# Patient Record
Sex: Female | Born: 1955 | Race: White | Hispanic: No | Smoking: Current some day smoker
Health system: Southern US, Community
[De-identification: ages and names within clinical notes are randomized; demographics above are authoritative.]

## PROBLEM LIST (undated history)

## (undated) DIAGNOSIS — J441 Chronic obstructive pulmonary disease with (acute) exacerbation: Secondary | ICD-10-CM

## (undated) DIAGNOSIS — M797 Fibromyalgia: Secondary | ICD-10-CM

## (undated) DIAGNOSIS — J189 Pneumonia, unspecified organism: Secondary | ICD-10-CM

## (undated) DIAGNOSIS — E0590 Thyrotoxicosis, unspecified without thyrotoxic crisis or storm: Secondary | ICD-10-CM

## (undated) DIAGNOSIS — M1990 Unspecified osteoarthritis, unspecified site: Secondary | ICD-10-CM

## (undated) DIAGNOSIS — R51 Headache: Secondary | ICD-10-CM

## (undated) DIAGNOSIS — I10 Essential (primary) hypertension: Secondary | ICD-10-CM

## (undated) DIAGNOSIS — R0602 Shortness of breath: Secondary | ICD-10-CM

## (undated) DIAGNOSIS — I509 Heart failure, unspecified: Secondary | ICD-10-CM

## (undated) DIAGNOSIS — I219 Acute myocardial infarction, unspecified: Secondary | ICD-10-CM

## (undated) DIAGNOSIS — H3320 Serous retinal detachment, unspecified eye: Secondary | ICD-10-CM

## (undated) DIAGNOSIS — I160 Hypertensive urgency: Secondary | ICD-10-CM

## (undated) DIAGNOSIS — A4902 Methicillin resistant Staphylococcus aureus infection, unspecified site: Secondary | ICD-10-CM

## (undated) DIAGNOSIS — J40 Bronchitis, not specified as acute or chronic: Secondary | ICD-10-CM

## (undated) DIAGNOSIS — J9600 Acute respiratory failure, unspecified whether with hypoxia or hypercapnia: Secondary | ICD-10-CM

## (undated) DIAGNOSIS — M5430 Sciatica, unspecified side: Secondary | ICD-10-CM

## (undated) HISTORY — PX: TUBAL LIGATION: SHX77

## (undated) HISTORY — PX: KNEE SURGERY: SHX244

---

## 2004-07-16 ENCOUNTER — Emergency Department (HOSPITAL_COMMUNITY): Admission: EM | Admit: 2004-07-16 | Discharge: 2004-07-17 | Payer: Self-pay | Admitting: Emergency Medicine

## 2010-11-10 ENCOUNTER — Emergency Department (HOSPITAL_COMMUNITY): Payer: Self-pay

## 2010-11-10 ENCOUNTER — Emergency Department (HOSPITAL_COMMUNITY)
Admission: EM | Admit: 2010-11-10 | Discharge: 2010-11-10 | Disposition: A | Discharge status: Home / Self Care | Payer: Self-pay | Attending: Emergency Medicine | Admitting: Emergency Medicine

## 2010-11-10 DIAGNOSIS — IMO0001 Reserved for inherently not codable concepts without codable children: Secondary | ICD-10-CM | POA: Insufficient documentation

## 2010-11-10 DIAGNOSIS — M129 Arthropathy, unspecified: Secondary | ICD-10-CM | POA: Insufficient documentation

## 2010-11-10 DIAGNOSIS — J45909 Unspecified asthma, uncomplicated: Secondary | ICD-10-CM | POA: Insufficient documentation

## 2010-11-10 DIAGNOSIS — J329 Chronic sinusitis, unspecified: Secondary | ICD-10-CM | POA: Insufficient documentation

## 2010-11-10 DIAGNOSIS — R51 Headache: Secondary | ICD-10-CM | POA: Insufficient documentation

## 2011-03-28 ENCOUNTER — Emergency Department (HOSPITAL_COMMUNITY): Payer: Self-pay

## 2011-03-28 ENCOUNTER — Encounter: Payer: Self-pay | Admitting: Emergency Medicine

## 2011-03-28 ENCOUNTER — Emergency Department (HOSPITAL_COMMUNITY)
Admission: EM | Admit: 2011-03-28 | Discharge: 2011-03-28 | Disposition: A | Discharge status: Home / Self Care | Payer: Self-pay | Attending: Emergency Medicine | Admitting: Emergency Medicine

## 2011-03-28 DIAGNOSIS — R062 Wheezing: Secondary | ICD-10-CM | POA: Insufficient documentation

## 2011-03-28 DIAGNOSIS — R05 Cough: Secondary | ICD-10-CM | POA: Insufficient documentation

## 2011-03-28 DIAGNOSIS — F17200 Nicotine dependence, unspecified, uncomplicated: Secondary | ICD-10-CM | POA: Insufficient documentation

## 2011-03-28 DIAGNOSIS — R059 Cough, unspecified: Secondary | ICD-10-CM | POA: Insufficient documentation

## 2011-03-28 DIAGNOSIS — J40 Bronchitis, not specified as acute or chronic: Secondary | ICD-10-CM | POA: Insufficient documentation

## 2011-03-28 DIAGNOSIS — H9209 Otalgia, unspecified ear: Secondary | ICD-10-CM | POA: Insufficient documentation

## 2011-03-28 DIAGNOSIS — J9801 Acute bronchospasm: Secondary | ICD-10-CM | POA: Insufficient documentation

## 2011-03-28 MED ORDER — HYDROCODONE-ACETAMINOPHEN 5-325 MG PO TABS: 1.0000 | ORAL_TABLET | Freq: Four times a day (QID) | ORAL | Status: AC | PRN

## 2011-03-28 MED ORDER — DOXYCYCLINE HYCLATE 100 MG PO CAPS: 100.0000 mg | ORAL_CAPSULE | Freq: Two times a day (BID) | ORAL | Status: AC

## 2011-03-28 MED ORDER — METHYLPREDNISOLONE SODIUM SUCC 125 MG IJ SOLR
125.0000 mg | Freq: Once | INTRAMUSCULAR | Status: AC
  Administered 2011-03-28: 125 mg via INTRAMUSCULAR
  Filled 2011-03-28: qty 2

## 2011-03-28 MED ORDER — OXYCODONE-ACETAMINOPHEN 5-325 MG PO TABS
1.0000 | ORAL_TABLET | Freq: Once | ORAL | Status: AC
  Administered 2011-03-28: 1 via ORAL
  Filled 2011-03-28: qty 1

## 2011-03-28 MED ORDER — ALBUTEROL SULFATE HFA 108 (90 BASE) MCG/ACT IN AERS
2.0000 | INHALATION_SPRAY | RESPIRATORY_TRACT | Status: DC
  Administered 2011-03-28: 2 via RESPIRATORY_TRACT
  Filled 2011-03-28: qty 6.7

## 2011-03-28 NOTE — ED Provider Notes (Signed)
Scribed for Benny Lennert, MD, the patient was seen in room APA07/APA07 . This chart was scribed by Ellie Lunch. This patient's care was started at 7:41 PM.   CSN: [id number] Arrival date & time: 03/28/2011  7:13 PM  Chief Complaint  Patient presents with  . Cough  . Otalgia    (Consider location/radiation/quality/duration/timing/severity/associated sxs/prior treatment) HPI Marie West is a 55 y.o. female who presents to the Emergency Department complaining of productive cough with green sputum for the past two weeks.  Cough has been constant and become progressively worse. Also c/o right ear pain. Pt denies fever or ST. Reports this is the 1st time seeing a doctor for her symptoms.   History reviewed. No pertinent past medical history.  Past Surgical History  Procedure Date  . Knee surgery   . Tubal ligation     No family history on file.  History  Substance Use Topics  . Smoking status: Current Everyday Smoker -- 1.0 packs/day    Types: Cigarettes  . Smokeless tobacco: Not on file  . Alcohol Use: Yes     occ-wine    Review of Systems  Constitutional: Negative for fever and fatigue.  HENT: Positive for ear pain. Negative for congestion, sore throat, sinus pressure and ear discharge.   Eyes: Negative for discharge.  Respiratory: Positive for cough.   Cardiovascular: Negative for chest pain.  Gastrointestinal: Negative for abdominal pain and diarrhea.  Genitourinary: Negative for frequency and hematuria.  Musculoskeletal: Negative for back pain.  Skin: Negative for rash.  Neurological: Negative for seizures and headaches.  Hematological: Negative.   All other systems reviewed and are negative.   Allergies  Review of patient's allergies indicates no known allergies.  Home Medications  No current outpatient prescriptions on file.  BP 155/121  Pulse 79  Temp(Src) 98.6 F (37 C) (Oral)  Resp 20  Ht 5\' 4"  (1.626 m)  Wt 230 lb (104.327 kg)  BMI 39.48  kg/m2  SpO2 96%  Physical Exam  Nursing note and vitals reviewed. Constitutional: She is oriented to person, place, and time. She appears well-developed.  HENT:  Head: Normocephalic and atraumatic.       Right TM inflamed  Eyes: Conjunctivae and EOM are normal. No scleral icterus.  Neck: Neck supple. No thyromegaly present.  Cardiovascular: Normal rate and regular rhythm.  Exam reveals no gallop and no friction rub.   No murmur heard. Pulmonary/Chest: No stridor. She has wheezes (mild bilateral wheezes). She has no rales. She exhibits tenderness (left lateral ribs tender).       slight wheezing  Abdominal: She exhibits no distension. There is no tenderness. There is no rebound.  Musculoskeletal: Normal range of motion. She exhibits no edema.  Lymphadenopathy:    She has no cervical adenopathy.  Neurological: She is oriented to person, place, and time. Coordination normal.  Skin: No rash noted. No erythema.  Psychiatric: She has a normal mood and affect. Her behavior is normal.   Procedures (including critical care time)  OTHER DATA REVIEWED: Nursing notes, vital signs, and past medical records reviewed.   DIAGNOSTIC STUDIES: Oxygen Saturation is 96% on room air, normal by my interpretation.    LABS / RADIOLOGY:  Dg Chest 2 View  03/28/2011  *RADIOLOGY REPORT*  Clinical Data: Cough and congestion.  CHEST - 2 VIEW  Comparison: 11/10/2010.  Findings: Cardiomegaly.  Tortuous calcified aorta.  Central pulmonary vascular prominence.  No segmental infiltrate or pneumothorax.  Degenerative changes thoracic spine.  IMPRESSION:  Cardiomegaly and central pulmonary vessel prominence.  Calcified tortuous aorta.  No segmental infiltrate.  Original Report Authenticated By: Fuller Canada, M.D.   MDM: bronchitis  MEDICATIONS GIVEN IN THE E.D.  Medications  albuterol (PROVENTIL HFA;VENTOLIN HFA) 108 (90 BASE) MCG/ACT inhaler 2 puff (not administered)  methylPREDNISolone sodium succinate  (SOLU-MEDROL) 125 MG injection 125 mg (not administered)  doxycycline (VIBRAMYCIN) 100 MG capsule (not administered)  HYDROcodone-acetaminophen (NORCO) 5-325 MG per tablet (not administered)    DISCHARGE MEDICATIONS: New Prescriptions   DOXYCYCLINE (VIBRAMYCIN) 100 MG CAPSULE    Take 1 capsule (100 mg total) by mouth 2 (two) times daily.   HYDROCODONE-ACETAMINOPHEN (NORCO) 5-325 MG PER TABLET    Take 1 tablet by mouth every 6 (six) hours as needed for pain.    SCRIBE ATTESTATION: The chart was scribed for me under my direct supervision.  I personally performed the history, physical, and medical decision making and all procedures in the evaluation of this patient.Benny Lennert, MD 03/28/11 2126

## 2011-03-28 NOTE — ED Notes (Signed)
Patient c/o right ear pain and productive cough; states green.

## 2011-06-06 ENCOUNTER — Encounter (HOSPITAL_COMMUNITY): Payer: Self-pay | Admitting: *Deleted

## 2011-06-06 ENCOUNTER — Emergency Department (HOSPITAL_COMMUNITY)
Admission: EM | Admit: 2011-06-06 | Discharge: 2011-06-07 | Disposition: A | Discharge status: Home / Self Care | Payer: Self-pay | Attending: Emergency Medicine | Admitting: Emergency Medicine

## 2011-06-06 DIAGNOSIS — R220 Localized swelling, mass and lump, head: Secondary | ICD-10-CM | POA: Insufficient documentation

## 2011-06-06 DIAGNOSIS — F17200 Nicotine dependence, unspecified, uncomplicated: Secondary | ICD-10-CM | POA: Insufficient documentation

## 2011-06-06 DIAGNOSIS — K047 Periapical abscess without sinus: Secondary | ICD-10-CM | POA: Insufficient documentation

## 2011-06-06 DIAGNOSIS — R221 Localized swelling, mass and lump, neck: Secondary | ICD-10-CM | POA: Insufficient documentation

## 2011-06-06 DIAGNOSIS — K089 Disorder of teeth and supporting structures, unspecified: Secondary | ICD-10-CM | POA: Insufficient documentation

## 2011-06-06 NOTE — ED Notes (Signed)
Reports toothache that began last night.  Reports taking ibuprofen and tylenol through day for pain control.

## 2011-06-07 MED ORDER — OXYCODONE-ACETAMINOPHEN 5-325 MG PO TABS
1.0000 | ORAL_TABLET | Freq: Once | ORAL | Status: AC
  Administered 2011-06-07: 1 via ORAL
  Filled 2011-06-07: qty 1

## 2011-06-07 MED ORDER — AMOXICILLIN 500 MG PO CAPS: 500.0000 mg | ORAL_CAPSULE | Freq: Three times a day (TID) | ORAL | Status: AC

## 2011-06-07 MED ORDER — OXYCODONE-ACETAMINOPHEN 5-325 MG PO TABS: 1.0000 | ORAL_TABLET | ORAL | Status: AC | PRN

## 2011-06-07 MED ORDER — AMOXICILLIN 250 MG PO CAPS
500.0000 mg | ORAL_CAPSULE | Freq: Once | ORAL | Status: AC
  Administered 2011-06-07: 500 mg via ORAL
  Filled 2011-06-07: qty 2

## 2011-06-07 MED ORDER — IBUPROFEN 600 MG PO TABS: 600.0000 mg | ORAL_TABLET | Freq: Four times a day (QID) | ORAL | Status: AC | PRN

## 2011-06-07 NOTE — ED Provider Notes (Signed)
Medical screening examination/treatment/procedure(s) were performed by non-physician practitioner and as supervising physician I was immediately available for consultation/collaboration. Devoria Albe, MD, Armando Gang   [name], MD 06/07/11 [phone]

## 2011-06-07 NOTE — ED Provider Notes (Signed)
History     CSN: [id number] Arrival date & time: 06/06/2011 11:31 PM   First MD Initiated Contact with Patient 06/06/11 2336      Chief Complaint  Patient presents with  . Dental Pain    (Consider location/radiation/quality/duration/timing/severity/associated sxs/prior treatment) Patient is a 55 y.o. female presenting with tooth pain. The history is provided by the patient.  Dental PainThe primary symptoms include mouth pain. Primary symptoms do not include headaches, fever, shortness of breath or sore throat. The symptoms began yesterday. The symptoms are worsening. The symptoms occur constantly.  Additional symptoms include: dental sensitivity to temperature, gum swelling, gum tenderness and facial swelling. Additional symptoms do not include: jaw pain, pain with swallowing, ear pain, nosebleeds and swollen glands.    History reviewed. No pertinent past medical history.  Past Surgical History  Procedure Date  . Knee surgery   . Tubal ligation     History reviewed. No pertinent family history.  History  Substance Use Topics  . Smoking status: Current Everyday Smoker -- 1.0 packs/day    Types: Cigarettes  . Smokeless tobacco: Not on file  . Alcohol Use: Yes     occ-wine    OB History    Grav Para Term Preterm Abortions TAB SAB Ect Mult Living                  Review of Systems  Constitutional: Negative for fever.  HENT: Positive for facial swelling and dental problem. Negative for ear pain, nosebleeds, congestion, sore throat and neck pain.   Eyes: Negative.   Respiratory: Negative for chest tightness and shortness of breath.   Cardiovascular: Negative for chest pain.  Gastrointestinal: Negative for nausea and abdominal pain.  Genitourinary: Negative.   Musculoskeletal: Negative for joint swelling and arthralgias.  Skin: Negative.  Negative for rash and wound.  Neurological: Negative for dizziness, weakness, light-headedness, numbness and headaches.    Hematological: Negative.   Psychiatric/Behavioral: Negative.     Allergies  Bee venom  Home Medications   Current Outpatient Rx  Name Route Sig Dispense Refill  . AMOXICILLIN 500 MG PO CAPS Oral Take 1 capsule (500 mg total) by mouth 3 (three) times daily. 30 capsule 0  . COUGH & COLD PO Oral Take 5 mLs by mouth as needed. For cough       . DIPHENHYDRAMINE-ZINC ACETATE 1-0.1 % EX CREA Topical Apply 1 application topically daily as needed. For rash on hands     . HYDROCORTISONE 1 % EX CREA Topical Apply 1 application topically daily as needed. For rash on hands     . IBUPROFEN 200 MG PO TABS Oral Take 800 mg by mouth as needed. For pain    . IBUPROFEN 600 MG PO TABS Oral Take 1 tablet (600 mg total) by mouth every 6 (six) hours as needed for pain. 30 tablet 0  . OXYCODONE-ACETAMINOPHEN 5-325 MG PO TABS Oral Take 1 tablet by mouth every 4 (four) hours as needed for pain. 15 tablet 0    Pulse 81  Temp(Src) 98.6 F (37 C) (Oral)  Resp 18  Ht 5\' 10"  (1.778 m)  Wt 250 lb (113.399 kg)  BMI 35.87 kg/m2  SpO2 96%  Physical Exam  Nursing note and vitals reviewed. Constitutional: She is oriented to person, place, and time. She appears well-developed and well-nourished. No distress.  HENT:  Head: Normocephalic and atraumatic.  Right Ear: Tympanic membrane and external ear normal.  Left Ear: Tympanic membrane and external ear normal.  Mouth/Throat: Oropharynx is clear and moist and mucous membranes are normal. No oral lesions. Dental abscesses present.    Eyes: Conjunctivae are normal.  Neck: Normal range of motion. Neck supple.  Cardiovascular: Normal rate.   Pulmonary/Chest: Effort normal. No respiratory distress.  Musculoskeletal: Normal range of motion.  Lymphadenopathy:    She has no cervical adenopathy.  Neurological: She is alert and oriented to person, place, and time.  Skin: Skin is warm and dry. No erythema.  Psychiatric: She has a normal mood and affect.    ED  Course  Procedures (including critical care time)  Labs Reviewed - No data to display No results found.   1. Dental abscess       MDM  Amoxil.  Oxycodone.  Dental referrals given.        Candis Musa, PA 06/07/11 [phone]

## 2011-06-07 NOTE — ED Notes (Signed)
Swelling right side of face.  

## 2011-10-21 ENCOUNTER — Encounter (HOSPITAL_COMMUNITY): Payer: Self-pay | Admitting: *Deleted

## 2011-10-21 ENCOUNTER — Emergency Department (HOSPITAL_COMMUNITY)
Admission: EM | Admit: 2011-10-21 | Discharge: 2011-10-21 | Disposition: A | Discharge status: Home / Self Care | Payer: Self-pay | Attending: Emergency Medicine | Admitting: Emergency Medicine

## 2011-10-21 DIAGNOSIS — Z79899 Other long term (current) drug therapy: Secondary | ICD-10-CM | POA: Insufficient documentation

## 2011-10-21 DIAGNOSIS — F17200 Nicotine dependence, unspecified, uncomplicated: Secondary | ICD-10-CM | POA: Insufficient documentation

## 2011-10-21 DIAGNOSIS — X500XXA Overexertion from strenuous movement or load, initial encounter: Secondary | ICD-10-CM | POA: Insufficient documentation

## 2011-10-21 DIAGNOSIS — S39012A Strain of muscle, fascia and tendon of lower back, initial encounter: Secondary | ICD-10-CM

## 2011-10-21 DIAGNOSIS — Z6841 Body Mass Index (BMI) 40.0 and over, adult: Secondary | ICD-10-CM | POA: Insufficient documentation

## 2011-10-21 DIAGNOSIS — M549 Dorsalgia, unspecified: Secondary | ICD-10-CM | POA: Insufficient documentation

## 2011-10-21 DIAGNOSIS — E669 Obesity, unspecified: Secondary | ICD-10-CM | POA: Insufficient documentation

## 2011-10-21 DIAGNOSIS — S335XXA Sprain of ligaments of lumbar spine, initial encounter: Secondary | ICD-10-CM | POA: Insufficient documentation

## 2011-10-21 DIAGNOSIS — Y998 Other external cause status: Secondary | ICD-10-CM | POA: Insufficient documentation

## 2011-10-21 HISTORY — DX: Methicillin resistant Staphylococcus aureus infection, unspecified site: A49.02

## 2011-10-21 MED ORDER — HYDROCODONE-ACETAMINOPHEN 7.5-325 MG PO TABS: 1.0000 | ORAL_TABLET | ORAL | Status: AC | PRN

## 2011-10-21 MED ORDER — MELOXICAM 7.5 MG PO TABS: ORAL_TABLET | ORAL | Status: DC

## 2011-10-21 MED ORDER — BACLOFEN 10 MG PO TABS: 10.0000 mg | ORAL_TABLET | Freq: Three times a day (TID) | ORAL | Status: AC

## 2011-10-21 NOTE — ED Notes (Addendum)
Pt states her large dog "knocked her off balance, hearing a pop noise". Awoke this morning with mid back ache that has progressively gotten worse over the day. Pain has radiated into rt buttock and leg. Denies bladder and bowel incontinent.   Bilateral lower extremities pulses equal.

## 2011-10-21 NOTE — ED Notes (Signed)
Dog hit back of leg and pt went backwards..  Caught herself and did not fall.  Pain rt back and down leg.

## 2011-10-21 NOTE — ED Notes (Signed)
EDP at bedside.  Discussing plan of care and examining pt.

## 2011-10-21 NOTE — ED Provider Notes (Signed)
History     CSN: [id number]  Arrival date & time 10/21/11  2001   First MD Initiated Contact with Patient 10/21/11 2023      Chief Complaint  Patient presents with  . Back Pain    (Consider location/radiation/quality/duration/timing/severity/associated sxs/prior treatment) Patient is a 56 y.o. female presenting with back pain. The history is provided by the patient.  Back Pain  This is a new problem. The current episode started yesterday. The problem occurs constantly. The problem has been gradually worsening. The pain is associated with twisting (Her heavy dog jumped on her, and caused twisting.). The pain is present in the thoracic spine and lumbar spine. The quality of the pain is described as aching and cramping. The pain radiates to the right thigh. The pain is moderate. The symptoms are aggravated by certain positions (laughing). The pain is the same all the time. Stiffness is present all day. Pertinent negatives include no chest pain, no numbness, no abdominal pain, no bowel incontinence, no perianal numbness, no bladder incontinence, no dysuria and no paresthesias. She has tried NSAIDs for the symptoms. The treatment provided no relief. Risk factors include obesity (previous knee surgery).    Past Medical History  Diagnosis Date  . MRSA infection     Past Surgical History  Procedure Date  . Knee surgery   . Tubal ligation     History reviewed. No pertinent family history.  History  Substance Use Topics  . Smoking status: Current Everyday Smoker -- 1.0 packs/day    Types: Cigarettes  . Smokeless tobacco: Not on file  . Alcohol Use: Yes     occ-wine    OB History    Grav Para Term Preterm Abortions TAB SAB Ect Mult Living                  Review of Systems  Constitutional: Negative for activity change.       All ROS Neg except as noted in HPI  HENT: Negative for nosebleeds and neck pain.   Eyes: Negative for photophobia and discharge.  Respiratory: Negative  for cough, shortness of breath and wheezing.   Cardiovascular: Negative for chest pain and palpitations.  Gastrointestinal: Negative for abdominal pain, blood in stool and bowel incontinence.  Genitourinary: Negative for bladder incontinence, dysuria, frequency and hematuria.  Musculoskeletal: Positive for back pain. Negative for arthralgias.  Skin: Negative.   Neurological: Negative for dizziness, seizures, speech difficulty, numbness and paresthesias.  Psychiatric/Behavioral: Negative for hallucinations and confusion.    Allergies  Bee venom  Home Medications   Current Outpatient Rx  Name Route Sig Dispense Refill  . BACLOFEN 10 MG PO TABS Oral Take 1 tablet (10 mg total) by mouth 3 (three) times daily. 21 each 0  . COUGH & COLD PO Oral Take 5 mLs by mouth as needed. For cough       . DIPHENHYDRAMINE-ZINC ACETATE 1-0.1 % EX CREA Topical Apply 1 application topically daily as needed. For rash on hands     . HYDROCODONE-ACETAMINOPHEN 7.5-325 MG PO TABS Oral Take 1 tablet by mouth every 4 (four) hours as needed for pain. 20 tablet 0  . HYDROCORTISONE 1 % EX CREA Topical Apply 1 application topically daily as needed. For rash on hands     . IBUPROFEN 200 MG PO TABS Oral Take 800 mg by mouth as needed. For pain    . MELOXICAM 7.5 MG PO TABS  1 po bid with food 12 tablet 0  BP 159/96  Pulse 79  Temp 98.3 F (36.8 C)  Resp 20  Ht 5\' 3"  (1.6 m)  Wt 250 lb (113.399 kg)  BMI 44.29 kg/m2  SpO2 96%  Physical Exam  Nursing note and vitals reviewed. Constitutional: She is oriented to person, place, and time. She appears well-developed and well-nourished.  Non-toxic appearance.  HENT:  Head: Normocephalic.  Right Ear: Tympanic membrane and external ear normal.  Left Ear: Tympanic membrane and external ear normal.  Eyes: EOM and lids are normal. Pupils are equal, round, and reactive to light.  Neck: Normal range of motion. Neck supple. Carotid bruit is not present.  Cardiovascular:  Normal rate, regular rhythm, normal heart sounds, intact distal pulses and normal pulses.   Pulmonary/Chest: Breath sounds normal. No respiratory distress.  Abdominal: Soft. Bowel sounds are normal. There is no tenderness. There is no guarding.  Musculoskeletal: Normal range of motion.       Pain to palpation and attempted ROM of the lumbar area. No palpable deformity.  Lymphadenopathy:       Head (right side): No submandibular adenopathy present.       Head (left side): No submandibular adenopathy present.    She has no cervical adenopathy.  Neurological: She is alert and oriented to person, place, and time. She has normal strength. No cranial nerve deficit or sensory deficit. She exhibits normal muscle tone. Coordination normal.       Gait slow, but intact.  Skin: Skin is warm and dry.  Psychiatric: She has a normal mood and affect. Her speech is normal.    ED Course  Procedures (including critical care time)  Labs Reviewed - No data to display No results found.   1. Lumbar strain       MDM  I have reviewed nursing notes, vital signs, and all appropriate lab and imaging results for this patient. No gross neuro deficits. Suspect lumbar strain. Pt to see PCP for recheck if not improving. Rx for baclofen, norco, Mobic given. Pt to use heating pad to back.       Kathie Dike, [place] 10/21/11 2048

## 2011-10-21 NOTE — Discharge Instructions (Signed)
Please rest your back as much as possible. Please use a heating pad to the lower back. Baclofen and mobic daily . Norco as needed for pain. This medication may cause drowsiness, use with caution. If not improving, please see your primary MD or Dr Shon Baton (orthopedics) for additional evaluation of your back.Muscle Strain A muscle strain (pulled muscle) happens when a muscle is over-stretched. Recovery usually takes 5 to 6 weeks.  HOME CARE   Put ice on the injured area.   Put ice in a plastic bag.   Place a towel between your skin and the bag.   Leave the ice on for 15 to 20 minutes at a time, every hour for the first 2 days.   Do not use the muscle for several days or until your doctor says you can. Do not use the muscle if you have pain.   Wrap the injured area with an elastic bandage for comfort. Do not put it on too tightly.   Only take medicine as told by your doctor.   Warm up before exercise. This helps prevent muscle strains.  GET HELP RIGHT AWAY IF:  There is increased pain or puffiness (swelling) in the affected area. MAKE SURE YOU:   Understand these instructions.   Will watch your condition.   Will get help right away if you are not doing well or get worse.  Document Released: 03/15/2008 Document Revised: 05/26/2011 Document Reviewed: 03/15/2008 [facility] Patient Information 2012 [place], [place].

## 2011-10-21 NOTE — ED Provider Notes (Signed)
Medical screening examination/treatment/procedure(s) were performed by non-physician practitioner and as supervising physician I was immediately available for consultation/collaboration.   Tenee Wish, MD 10/21/11 2340 

## 2011-12-29 ENCOUNTER — Encounter (HOSPITAL_COMMUNITY): Payer: Self-pay | Admitting: *Deleted

## 2011-12-29 ENCOUNTER — Emergency Department (HOSPITAL_COMMUNITY)
Admission: EM | Admit: 2011-12-29 | Discharge: 2011-12-29 | Disposition: A | Discharge status: Home / Self Care | Payer: Self-pay | Attending: Emergency Medicine | Admitting: Emergency Medicine

## 2011-12-29 DIAGNOSIS — M25473 Effusion, unspecified ankle: Secondary | ICD-10-CM | POA: Insufficient documentation

## 2011-12-29 DIAGNOSIS — M25476 Effusion, unspecified foot: Secondary | ICD-10-CM | POA: Insufficient documentation

## 2011-12-29 DIAGNOSIS — R609 Edema, unspecified: Secondary | ICD-10-CM | POA: Insufficient documentation

## 2011-12-29 DIAGNOSIS — M79609 Pain in unspecified limb: Secondary | ICD-10-CM | POA: Insufficient documentation

## 2011-12-29 DIAGNOSIS — R5381 Other malaise: Secondary | ICD-10-CM | POA: Insufficient documentation

## 2011-12-29 DIAGNOSIS — R5383 Other fatigue: Secondary | ICD-10-CM | POA: Insufficient documentation

## 2011-12-29 DIAGNOSIS — R030 Elevated blood-pressure reading, without diagnosis of hypertension: Secondary | ICD-10-CM | POA: Insufficient documentation

## 2011-12-29 DIAGNOSIS — M7989 Other specified soft tissue disorders: Secondary | ICD-10-CM | POA: Insufficient documentation

## 2011-12-29 DIAGNOSIS — I10 Essential (primary) hypertension: Secondary | ICD-10-CM

## 2011-12-29 LAB — CBC WITH DIFFERENTIAL/PLATELET
Basophils Absolute: 0 10*3/uL (ref 0.0–0.1)
Basophils Relative: 0 % (ref 0–1)
Eosinophils Absolute: 0.4 10*3/uL (ref 0.0–0.7)
Eosinophils Relative: 6 % — ABNORMAL HIGH (ref 0–5)
HCT: 40 % (ref 36.0–46.0)
Hemoglobin: 12.9 g/dL (ref 12.0–15.0)
Lymphocytes Relative: 34 % (ref 12–46)
Lymphs Abs: 2.5 10*3/uL (ref 0.7–4.0)
MCH: 29.4 pg (ref 26.0–34.0)
MCHC: 32.3 g/dL (ref 30.0–36.0)
MCV: 91.1 fL (ref 78.0–100.0)
Monocytes Absolute: 0.6 10*3/uL (ref 0.1–1.0)
Monocytes Relative: 8 % (ref 3–12)
Neutro Abs: 3.9 10*3/uL (ref 1.7–7.7)
Neutrophils Relative %: 52 % (ref 43–77)
Platelets: 181 10*3/uL (ref 150–400)
RBC: 4.39 MIL/uL (ref 3.87–5.11)
RDW: 14.2 % (ref 11.5–15.5)
WBC: 7.5 10*3/uL (ref 4.0–10.5)

## 2011-12-29 LAB — COMPREHENSIVE METABOLIC PANEL
ALT: 17 U/L (ref 0–35)
AST: 17 U/L (ref 0–37)
Albumin: 3.9 g/dL (ref 3.5–5.2)
Alkaline Phosphatase: 65 U/L (ref 39–117)
BUN: 17 mg/dL (ref 6–23)
CO2: 31 mEq/L (ref 19–32)
Calcium: 9.3 mg/dL (ref 8.4–10.5)
Chloride: 101 mEq/L (ref 96–112)
Creatinine, Ser: 0.68 mg/dL (ref 0.50–1.10)
GFR calc Af Amer: >90 mL/min (ref >90)
GFR calc non Af Amer: >90 mL/min (ref >90)
Glucose, Bld: 93 mg/dL (ref 70–99)
Potassium: 3.8 mEq/L (ref 3.5–5.1)
Sodium: 140 mEq/L (ref 135–145)
Total Bilirubin: 0.3 mg/dL (ref 0.3–1.2)
Total Protein: 7.2 g/dL (ref 6.0–8.3)

## 2011-12-29 MED ORDER — TRAMADOL HCL 50 MG PO TABS: 50.0000 mg | ORAL_TABLET | Freq: Four times a day (QID) | ORAL | Status: AC | PRN

## 2011-12-29 MED ORDER — HYDROCHLOROTHIAZIDE 25 MG PO TABS: 25.0000 mg | ORAL_TABLET | Freq: Every day | ORAL | Status: DC

## 2011-12-29 NOTE — ED Provider Notes (Cosign Needed)
History    This chart was scribed for Benny Lennert, MD, MD by Smitty Pluck. The patient was seen in room APA17 and the patient's care was started at 7:12PM.   CSN: [id number]  Arrival date & time 12/29/11  1842   First MD Initiated Contact with Patient 12/29/11 1907      Chief Complaint  Patient presents with  . Joint Swelling    (Consider location/radiation/quality/duration/timing/severity/associated sxs/prior treatment) Patient is a 56 y.o. female presenting with leg pain. The history is provided by the patient. No language interpreter was used.  Leg Pain  The incident occurred more than 2 days ago. The incident occurred at home. There was no injury mechanism. Pain location: right and left calfs. The quality of the pain is described as aching. The pain is at a severity of 4/10.   KRYSIA ZAHRADNIK is a 56 y.o. female who presents to the Emergency Department complaining of moderate bilateral ankle and lower leg swelling onset over  1 week ago. Pt reports that swelling started in right leg. Reports having generalized weakness. Denies chest pain, cough, n/v/d and SOB. Symptoms have been constant.   Past Medical History  Diagnosis Date  . MRSA infection     Past Surgical History  Procedure Date  . Knee surgery   . Tubal ligation     History reviewed. No pertinent family history.  History  Substance Use Topics  . Smoking status: Current Everyday Smoker -- 1.0 packs/day    Types: Cigarettes  . Smokeless tobacco: Not on file  . Alcohol Use: Yes     occ-wine    OB History    Grav Para Term Preterm Abortions TAB SAB Ect Mult Living                  Review of Systems  All other systems reviewed and are negative.  10 Systems reviewed and all are negative for acute change except as noted in the HPI.    Allergies  Bee venom  Home Medications   Current Outpatient Rx  Name Route Sig Dispense Refill  . COUGH & COLD PO Oral Take 5 mLs by mouth as needed. For cough       . DIPHENHYDRAMINE-ZINC ACETATE 1-0.1 % EX CREA Topical Apply 1 application topically daily as needed. For rash on hands     . HYDROCORTISONE 1 % EX CREA Topical Apply 1 application topically daily as needed. For rash on hands     . IBUPROFEN 200 MG PO TABS Oral Take 800 mg by mouth as needed. For pain    . MELOXICAM 7.5 MG PO TABS  1 po bid with food 12 tablet 0    BP 135/111  Pulse 80  Temp 98.5 F (36.9 C) (Oral)  Resp 18  Ht 5\' 2"  (1.575 m)  Wt 250 lb (113.399 kg)  BMI 45.73 kg/m2  SpO2 96%  Physical Exam  Nursing note and vitals reviewed. Constitutional: She is oriented to person, place, and time. She appears well-developed.  HENT:  Head: Normocephalic and atraumatic.  Eyes: Conjunctivae and EOM are normal. No scleral icterus.  Neck: Neck supple. No thyromegaly present.  Cardiovascular: Normal rate and regular rhythm.  Exam reveals no gallop and no friction rub.   No murmur heard. Pulmonary/Chest: No stridor. She has no wheezes. She has no rales. She exhibits no tenderness.  Abdominal: She exhibits no distension. There is no tenderness. There is no rebound.  Musculoskeletal: Normal range of motion. She  exhibits edema (2+).  Lymphadenopathy:    She has no cervical adenopathy.  Neurological: She is oriented to person, place, and time. Coordination normal.  Skin: No rash noted. No erythema.  Psychiatric: She has a normal mood and affect. Her behavior is normal.    ED Course  Procedures (including critical care time) DIAGNOSTIC STUDIES: Oxygen Saturation is 96% on room air, normal by my interpretation.    COORDINATION OF CARE: 7:16PM EDP discusses pt ED treatment with pt.  9:36PM EDP recheck pt and discusses lab results.   Labs Reviewed  CBC WITH DIFFERENTIAL - Abnormal; Notable for the following:    Eosinophils Relative 6 (*)     All other components within normal limits  COMPREHENSIVE METABOLIC PANEL   No results found.   No diagnosis found.    MDM      The chart was scribed for me under my direct supervision.  I personally performed the history, physical, and medical decision making and all procedures in the evaluation of this patient.Benny Lennert, MD 12/29/11 2142

## 2011-12-29 NOTE — ED Notes (Signed)
Pt alert & oriented x4, stable gait. Patient given discharge instructions, paperwork & prescription(s). Patient  instructed to stop at the registration desk to finish any additional paperwork. Patient verbalized understanding. Pt left department w/ no further questions. 

## 2011-12-29 NOTE — ED Notes (Signed)
Bil ankle and lower leg swelling for over a week, Hurting worse today.

## 2012-05-31 ENCOUNTER — Encounter (HOSPITAL_COMMUNITY): Payer: Self-pay | Admitting: Emergency Medicine

## 2012-05-31 ENCOUNTER — Inpatient Hospital Stay (HOSPITAL_COMMUNITY)
Admission: EM | Admit: 2012-05-31 | Discharge: 2012-06-02 | DRG: 191 | Disposition: A | Discharge status: Home / Self Care | Payer: Self-pay | Attending: Internal Medicine | Admitting: Internal Medicine

## 2012-05-31 ENCOUNTER — Emergency Department (HOSPITAL_COMMUNITY): Payer: Self-pay

## 2012-05-31 DIAGNOSIS — Z720 Tobacco use: Secondary | ICD-10-CM | POA: Diagnosis present

## 2012-05-31 DIAGNOSIS — Z6841 Body Mass Index (BMI) 40.0 and over, adult: Secondary | ICD-10-CM

## 2012-05-31 DIAGNOSIS — M1990 Unspecified osteoarthritis, unspecified site: Secondary | ICD-10-CM

## 2012-05-31 DIAGNOSIS — I509 Heart failure, unspecified: Secondary | ICD-10-CM

## 2012-05-31 DIAGNOSIS — F17200 Nicotine dependence, unspecified, uncomplicated: Secondary | ICD-10-CM | POA: Diagnosis present

## 2012-05-31 DIAGNOSIS — E039 Hypothyroidism, unspecified: Secondary | ICD-10-CM | POA: Diagnosis present

## 2012-05-31 DIAGNOSIS — J45902 Unspecified asthma with status asthmaticus: Secondary | ICD-10-CM

## 2012-05-31 DIAGNOSIS — M1710 Unilateral primary osteoarthritis, unspecified knee: Secondary | ICD-10-CM | POA: Diagnosis present

## 2012-05-31 DIAGNOSIS — Z8249 Family history of ischemic heart disease and other diseases of the circulatory system: Secondary | ICD-10-CM

## 2012-05-31 DIAGNOSIS — Z79899 Other long term (current) drug therapy: Secondary | ICD-10-CM

## 2012-05-31 DIAGNOSIS — M19049 Primary osteoarthritis, unspecified hand: Secondary | ICD-10-CM | POA: Diagnosis present

## 2012-05-31 DIAGNOSIS — Z23 Encounter for immunization: Secondary | ICD-10-CM

## 2012-05-31 DIAGNOSIS — J441 Chronic obstructive pulmonary disease with (acute) exacerbation: Principal | ICD-10-CM | POA: Diagnosis present

## 2012-05-31 HISTORY — DX: Bronchitis, not specified as acute or chronic: J40

## 2012-05-31 LAB — CBC WITH DIFFERENTIAL/PLATELET
Basophils Absolute: 0.1 10*3/uL (ref 0.0–0.1)
Basophils Relative: 1 % (ref 0–1)
Eosinophils Absolute: 0.4 10*3/uL (ref 0.0–0.7)
Eosinophils Relative: 5 % (ref 0–5)
HCT: 39.9 % (ref 36.0–46.0)
Hemoglobin: 12.9 g/dL (ref 12.0–15.0)
Lymphocytes Relative: 28 % (ref 12–46)
Lymphs Abs: 2.1 10*3/uL (ref 0.7–4.0)
MCH: 30.1 pg (ref 26.0–34.0)
MCHC: 32.3 g/dL (ref 30.0–36.0)
MCV: 93.2 fL (ref 78.0–100.0)
Monocytes Absolute: 0.6 10*3/uL (ref 0.1–1.0)
Monocytes Relative: 8 % (ref 3–12)
Neutro Abs: 4.4 10*3/uL (ref 1.7–7.7)
Neutrophils Relative %: 59 % (ref 43–77)
Platelets: 187 10*3/uL (ref 150–400)
RBC: 4.28 MIL/uL (ref 3.87–5.11)
RDW: 14.6 % (ref 11.5–15.5)
WBC: 7.4 10*3/uL (ref 4.0–10.5)

## 2012-05-31 LAB — COMPREHENSIVE METABOLIC PANEL
ALT: 39 U/L — ABNORMAL HIGH (ref 0–35)
AST: 28 U/L (ref 0–37)
Albumin: 3.8 g/dL (ref 3.5–5.2)
Alkaline Phosphatase: 69 U/L (ref 39–117)
BUN: 16 mg/dL (ref 6–23)
CO2: 31 mEq/L (ref 19–32)
Calcium: 9 mg/dL (ref 8.4–10.5)
Chloride: 103 mEq/L (ref 96–112)
Creatinine, Ser: 0.69 mg/dL (ref 0.50–1.10)
GFR calc Af Amer: >90 mL/min (ref >90)
GFR calc non Af Amer: >90 mL/min (ref >90)
Glucose, Bld: 108 mg/dL — ABNORMAL HIGH (ref 70–99)
Potassium: 3.9 mEq/L (ref 3.5–5.1)
Sodium: 141 mEq/L (ref 135–145)
Total Bilirubin: 0.2 mg/dL — ABNORMAL LOW (ref 0.3–1.2)
Total Protein: 7.3 g/dL (ref 6.0–8.3)

## 2012-05-31 LAB — PRO B NATRIURETIC PEPTIDE: Pro B Natriuretic peptide (BNP): 217.3 pg/mL — ABNORMAL HIGH (ref 0–125)

## 2012-05-31 LAB — TROPONIN I: Troponin I: <0.3 ng/mL (ref <0.30)

## 2012-05-31 MED ORDER — ALBUTEROL (5 MG/ML) CONTINUOUS INHALATION SOLN
15.0000 mg/h | INHALATION_SOLUTION | Freq: Once | RESPIRATORY_TRACT | Status: AC
  Administered 2012-05-31: 15 mg/h via RESPIRATORY_TRACT
  Filled 2012-05-31: qty 20

## 2012-05-31 MED ORDER — IPRATROPIUM BROMIDE 0.02 % IN SOLN
0.5000 mg | Freq: Once | RESPIRATORY_TRACT | Status: AC
  Administered 2012-05-31: 0.5 mg via RESPIRATORY_TRACT
  Filled 2012-05-31: qty 2.5

## 2012-05-31 MED ORDER — METHYLPREDNISOLONE SODIUM SUCC 125 MG IJ SOLR
125.0000 mg | Freq: Once | INTRAMUSCULAR | Status: AC
  Administered 2012-05-31: 125 mg via INTRAVENOUS
  Filled 2012-05-31: qty 2

## 2012-05-31 MED ORDER — ALBUTEROL SULFATE (5 MG/ML) 0.5% IN NEBU
2.5000 mg | INHALATION_SOLUTION | Freq: Once | RESPIRATORY_TRACT | Status: AC
  Administered 2012-05-31: 2.5 mg via RESPIRATORY_TRACT
  Filled 2012-05-31: qty 0.5

## 2012-05-31 NOTE — ED Provider Notes (Signed)
History  This chart was scribed for [name], MD by Manuela Schwartz, ED scribe. This patient was seen in room [facility]18/[facility]18 and the patient's care was started at 2015.   CSN: [id number]  Arrival date & time 05/31/12  2015   First MD Initiated Contact with Patient 05/31/12 2047      Chief Complaint  Patient presents with  . Wheezing   The history is provided by the patient. No language interpreter was used.   Marie West is a 56 y.o. female who presents to the Emergency Department complaining of wheezing for the past week. She states wheezing worsened today and becomes dyspneic on exertion such as walking short distances. She used her granddaughters albuterol inhaler and nebulizer w/mild relief. She states fatigue, SOB, HA and also indicates pain at her upper abdomen/diaphragms when she coughs, subjective fever, non productive cough. She states worsened lower leg and ankle swelling lately. She denies sore throat, rhinorrhea, nausea, emesis, diarrhea.    She works in a Teacher, music and smokes 1ppd  She has no PCP  Past Medical History  Diagnosis Date  . MRSA infection   . Bronchitis     Past Surgical History  Procedure Date  . Knee surgery   . Tubal ligation     No family history on file.  History  Substance Use Topics  . Smoking status: Current Every Day Smoker -- 1.0 packs/day    Types: Cigarettes  . Smokeless tobacco: Not on file  . Alcohol Use: Yes     Comment: occ-wine  Lives at home employed  OB History    Grav Para Term Preterm Abortions TAB SAB Ect Mult Living                  Review of Systems  Constitutional: Negative for fever and chills.  HENT: Negative for rhinorrhea.   Respiratory: Positive for cough, shortness of breath and wheezing.   Cardiovascular: Positive for leg swelling. Negative for chest pain.  Gastrointestinal: Negative for nausea, vomiting and abdominal pain.  Neurological: Negative for weakness.  All other systems reviewed and are  negative.    Allergies  Bee venom  Home Medications   Current Outpatient Rx  Name  Route  Sig  Dispense  Refill  . ALBUTEROL SULFATE (2.5 MG/3ML) 0.083% IN NEBU   Nebulization   Take 2.5 mg by nebulization every 6 (six) hours as needed. For shortness of breath         . B-12 PO   Oral   Take 1 tablet by mouth daily.         Marland Kitchen DIPHENHYDRAMINE-ZINC ACETATE 1-0.1 % EX CREA   Topical   Apply 1 application topically daily as needed. For rash on hands          . HYDROCORTISONE 1 % EX CREA   Topical   Apply 1 application topically daily as needed. For rash on hands          . IBUPROFEN 200 MG PO TABS   Oral   Take 800 mg by mouth every 4 (four) hours as needed. For pain         . ADULT MULTIVITAMIN W/MINERALS CH   Oral   Take 1 tablet by mouth daily.           Triage Vitals: BP 171/92  Pulse 80  Temp 97.7 F (36.5 C) (Oral)  Resp 22  Ht 5\' 3"  (1.6 m)  Wt 240 lb (108.863 kg)  BMI 42.51 kg/m2  SpO2 93%  Vital signs normal except hypertension   Physical Exam  Nursing note and vitals reviewed. Constitutional: She is oriented to person, place, and time. She appears well-developed and well-nourished.  Non-toxic appearance. She does not appear ill. She appears distressed.       Obese Patient had already had one nebulizer treatment in the ED at the time of her exam  HENT:  Head: Normocephalic and atraumatic.  Right Ear: External ear normal.  Left Ear: External ear normal.  Nose: Nose normal. No mucosal edema or rhinorrhea.  Mouth/Throat: Oropharynx is clear and moist and mucous membranes are normal. No dental abscesses or uvula swelling.  Eyes: Conjunctivae normal and EOM are normal. Pupils are equal, round, and reactive to light.  Neck: Normal range of motion and full passive range of motion without pain. Neck supple.  Cardiovascular: Normal rate, regular rhythm and normal heart sounds.  Exam reveals no gallop and no friction rub.   No murmur  heard. Pulmonary/Chest: She is in respiratory distress. She has wheezes. She has no rhonchi. She has no rales. She exhibits no tenderness and no crepitus.       Diffuse wheezing all lung fields, audible wheezing, retractions, prolongation of expiratory phase.   Abdominal: Soft. Normal appearance and bowel sounds are normal. She exhibits no distension. There is no tenderness. There is no rebound and no guarding.  Musculoskeletal: Normal range of motion. She exhibits no edema and no tenderness.       Moves all extremities well.   Neurological: She is alert and oriented to person, place, and time. She has normal strength. No cranial nerve deficit.  Skin: Skin is warm, dry and intact. No rash noted. No erythema. No pallor.  Psychiatric: She has a normal mood and affect. Her speech is normal and behavior is normal. Her mood appears not anxious.    ED Course  Procedures (including critical care time)  Medications  Multiple Vitamin (MULTIVITAMIN WITH MINERALS) TABS (not administered)  Cyanocobalamin (B-12 PO) (not administered)  albuterol (PROVENTIL) (2.5 MG/3ML) 0.083% nebulizer solution (not administered)  albuterol (PROVENTIL) (5 MG/ML) 0.5% nebulizer solution 2.5 mg (2.5 mg Nebulization Given 05/31/12 2042)  ipratropium (ATROVENT) nebulizer solution 0.5 mg (0.5 mg Nebulization Given 05/31/12 2042)  methylPREDNISolone sodium succinate (SOLU-MEDROL) 125 mg/2 mL injection 125 mg (125 mg Intravenous Given 05/31/12 2143)  albuterol (PROVENTIL,VENTOLIN) solution continuous neb (15 mg/hr Nebulization Given 05/31/12 2137)  ipratropium (ATROVENT) nebulizer solution 0.5 mg (0.5 mg Nebulization Given 05/31/12 2137)   DIAGNOSTIC STUDIES: Oxygen Saturation is 92% on room air, adequate by my interpretation.    COORDINATION OF CARE: At 910 PM Discussed treatment plan with patient which includes proventil, atrovent, CXR, blood work, cardiac markers. Patient agrees.   At 2250 PM Pt 3rd of continuous  nebulizer still left, pt still with audible wheezing and diffuse wheezing, retractions still present but reduced.   Recheck at 2355. Patient has finished her continuous nebulizer 15 mg of albuterol. She appears more comfortable. She has improved air movement. However she still has diffuse end expiratory wheezing bilaterally. When she last she has audible wheezing. Patient did not want to be admitted, I was going to have her ambulate with a pulse ox reading, however she then decided she should stay in the hospital.   00:30 Dr Orvan Falconer, admit to med-surg   Results for orders placed during the hospital encounter of 05/31/12  CBC WITH DIFFERENTIAL      Component Value Range  WBC 7.4  4.0 - 10.5 K/uL   RBC 4.28  3.87 - 5.11 MIL/uL   Hemoglobin 12.9  12.0 - 15.0 g/dL   HCT 16.1  09.6 - 04.5 %   MCV 93.2  78.0 - 100.0 fL   MCH 30.1  26.0 - 34.0 pg   MCHC 32.3  30.0 - 36.0 g/dL   RDW 40.9  81.1 - 91.4 %   Platelets 187  150 - 400 K/uL   Neutrophils Relative 59  43 - 77 %   Neutro Abs 4.4  1.7 - 7.7 K/uL   Lymphocytes Relative 28  12 - 46 %   Lymphs Abs 2.1  0.7 - 4.0 K/uL   Monocytes Relative 8  3 - 12 %   Monocytes Absolute 0.6  0.1 - 1.0 K/uL   Eosinophils Relative 5  0 - 5 %   Eosinophils Absolute 0.4  0.0 - 0.7 K/uL   Basophils Relative 1  0 - 1 %   Basophils Absolute 0.1  0.0 - 0.1 K/uL  COMPREHENSIVE METABOLIC PANEL      Component Value Range   Sodium 141  135 - 145 mEq/L   Potassium 3.9  3.5 - 5.1 mEq/L   Chloride 103  96 - 112 mEq/L   CO2 31  19 - 32 mEq/L   Glucose, Bld 108 (*) 70 - 99 mg/dL   BUN 16  6 - 23 mg/dL   Creatinine, Ser 7.82  0.50 - 1.10 mg/dL   Calcium 9.0  8.4 - 95.6 mg/dL   Total Protein 7.3  6.0 - 8.3 g/dL   Albumin 3.8  3.5 - 5.2 g/dL   AST 28  0 - 37 U/L   ALT 39 (*) 0 - 35 U/L   Alkaline Phosphatase 69  39 - 117 U/L   Total Bilirubin 0.2 (*) 0.3 - 1.2 mg/dL   GFR calc non Af Amer >90  >90 mL/min   GFR calc Af Amer >90  >90 mL/min  PRO B NATRIURETIC  PEPTIDE      Component Value Range   Pro B Natriuretic peptide (BNP) 217.3 (*) 0 - 125 pg/mL  TROPONIN I      Component Value Range   Troponin I <0.30  <0.30 ng/mL   Laboratory interpretation all normal    Dg Chest Portable 1 View  05/31/2012  *RADIOLOGY REPORT*  Clinical Data: Shortness of breath and wheezing.  PORTABLE CHEST - 1 VIEW  Comparison: 03/28/2011  Findings: Shallow inspiration.  Enlarged heart size and increased pulmonary vascularity suggesting congestive change.  No evidence of significant edema.  No blunting of costophrenic angles.  No focal consolidation.  No pneumothorax.  Changes appear to have progressed since the previous study.  IMPRESSION: Progressive cardiac enlargement and pulmonary vascular congestion since previous study.   Original Report Authenticated By: Burman Nieves, M.D.      Date: 05/31/2012  Rate: 67  Rhythm: normal sinus rhythm  QRS Axis: normal  Intervals: normal  ST/T Wave abnormalities: normal  Conduction Disutrbances:none  Narrative Interpretation:   Old EKG Reviewed: none available    1. Status asthmaticus     Plan admission  Devoria Albe, MD, FACEP   CRITICAL CARE Performed by: Devoria Albe L   Total critical care time: 39 min   Critical care time was exclusive of separately billable procedures and treating other patients.  Critical care was necessary to treat or prevent imminent or life-threatening deterioration.  Critical care was time spent personally by me  on the following activities: development of treatment plan with patient and/or surrogate as well as nursing, discussions with consultants, evaluation of patient's response to treatment, examination of patient, obtaining history from patient or surrogate, ordering and performing treatments and interventions, ordering and review of laboratory studies, ordering and review of radiographic studies, pulse oximetry and re-evaluation of patient's condition.   MDM  I personally  performed the services described in this documentation, which was scribed in my presence. The recorded information has been reviewed and considered.  Devoria Albe, MD, FACEP          [name], MD 06/01/12 [id number]

## 2012-05-31 NOTE — ED Notes (Signed)
Patient c/o wheezing x 1 week; states has been using nebulizers at home without relief.

## 2012-06-01 ENCOUNTER — Encounter (HOSPITAL_COMMUNITY): Payer: Self-pay | Admitting: *Deleted

## 2012-06-01 DIAGNOSIS — R0609 Other forms of dyspnea: Secondary | ICD-10-CM

## 2012-06-01 DIAGNOSIS — M129 Arthropathy, unspecified: Secondary | ICD-10-CM

## 2012-06-01 DIAGNOSIS — F17200 Nicotine dependence, unspecified, uncomplicated: Secondary | ICD-10-CM

## 2012-06-01 DIAGNOSIS — R0989 Other specified symptoms and signs involving the circulatory and respiratory systems: Secondary | ICD-10-CM

## 2012-06-01 DIAGNOSIS — M1990 Unspecified osteoarthritis, unspecified site: Secondary | ICD-10-CM | POA: Diagnosis present

## 2012-06-01 DIAGNOSIS — J441 Chronic obstructive pulmonary disease with (acute) exacerbation: Principal | ICD-10-CM

## 2012-06-01 DIAGNOSIS — Z720 Tobacco use: Secondary | ICD-10-CM | POA: Diagnosis present

## 2012-06-01 DIAGNOSIS — I509 Heart failure, unspecified: Secondary | ICD-10-CM

## 2012-06-01 DIAGNOSIS — I10 Essential (primary) hypertension: Secondary | ICD-10-CM

## 2012-06-01 LAB — CBC
HCT: 43.1 % (ref 36.0–46.0)
Hemoglobin: 13.8 g/dL (ref 12.0–15.0)
MCH: 29.6 pg (ref 26.0–34.0)
MCHC: 32 g/dL (ref 30.0–36.0)
MCV: 92.3 fL (ref 78.0–100.0)
Platelets: 217 10*3/uL (ref 150–400)
RBC: 4.67 MIL/uL (ref 3.87–5.11)
RDW: 14.4 % (ref 11.5–15.5)
WBC: 6 10*3/uL (ref 4.0–10.5)

## 2012-06-01 LAB — URINALYSIS, ROUTINE W REFLEX MICROSCOPIC
Bilirubin Urine: NEGATIVE
Glucose, UA: NEGATIVE mg/dL
Hgb urine dipstick: NEGATIVE
Ketones, ur: NEGATIVE mg/dL
Leukocytes, UA: NEGATIVE
Nitrite: NEGATIVE
Specific Gravity, Urine: 1.025 (ref 1.005–1.030)
Urobilinogen, UA: 0.2 mg/dL (ref 0.0–1.0)
pH: 6.5 (ref 5.0–8.0)

## 2012-06-01 LAB — COMPREHENSIVE METABOLIC PANEL
ALT: 39 U/L — ABNORMAL HIGH (ref 0–35)
AST: 22 U/L (ref 0–37)
Albumin: 4 g/dL (ref 3.5–5.2)
Alkaline Phosphatase: 73 U/L (ref 39–117)
BUN: 13 mg/dL (ref 6–23)
CO2: 28 mEq/L (ref 19–32)
Calcium: 9.2 mg/dL (ref 8.4–10.5)
Chloride: 102 mEq/L (ref 96–112)
Creatinine, Ser: 0.57 mg/dL (ref 0.50–1.10)
GFR calc Af Amer: >90 mL/min (ref >90)
GFR calc non Af Amer: >90 mL/min (ref >90)
Glucose, Bld: 158 mg/dL — ABNORMAL HIGH (ref 70–99)
Potassium: 4 mEq/L (ref 3.5–5.1)
Sodium: 141 mEq/L (ref 135–145)
Total Bilirubin: 0.2 mg/dL — ABNORMAL LOW (ref 0.3–1.2)
Total Protein: 7.6 g/dL (ref 6.0–8.3)

## 2012-06-01 LAB — HEMOGLOBIN A1C
Hgb A1c MFr Bld: 5.6 % (ref <5.7)
Mean Plasma Glucose: 114 mg/dL (ref <117)

## 2012-06-01 LAB — URINE MICROSCOPIC-ADD ON

## 2012-06-01 LAB — TSH: TSH: 7.501 u[IU]/mL — ABNORMAL HIGH (ref 0.350–4.500)

## 2012-06-01 LAB — MRSA PCR SCREENING

## 2012-06-01 MED ORDER — INFLUENZA VIRUS VACC SPLIT PF IM SUSP
0.5000 mL | INTRAMUSCULAR | Status: AC
  Administered 2012-06-02: 0.5 mL via INTRAMUSCULAR
  Filled 2012-06-01: qty 0.5

## 2012-06-01 MED ORDER — LEVOFLOXACIN 500 MG PO TABS
500.0000 mg | ORAL_TABLET | Freq: Every day | ORAL | Status: DC
  Administered 2012-06-01 – 2012-06-02 (×2): 500 mg via ORAL
  Filled 2012-06-01 (×2): qty 1

## 2012-06-01 MED ORDER — TRAZODONE HCL 50 MG PO TABS: 25.0000 mg | ORAL_TABLET | Freq: Every evening | ORAL | Status: DC | PRN

## 2012-06-01 MED ORDER — PNEUMOCOCCAL VAC POLYVALENT 25 MCG/0.5ML IJ INJ
0.5000 mL | INJECTION | INTRAMUSCULAR | Status: AC
  Administered 2012-06-02: 0.5 mL via INTRAMUSCULAR
  Filled 2012-06-01: qty 0.5

## 2012-06-01 MED ORDER — FUROSEMIDE 20 MG PO TABS
20.0000 mg | ORAL_TABLET | Freq: Every day | ORAL | Status: DC
  Administered 2012-06-01 – 2012-06-02 (×2): 20 mg via ORAL
  Filled 2012-06-01 (×2): qty 1

## 2012-06-01 MED ORDER — SODIUM CHLORIDE 0.9 % IV SOLN: INTRAVENOUS | Status: DC

## 2012-06-01 MED ORDER — PREDNISONE 20 MG PO TABS
40.0000 mg | ORAL_TABLET | Freq: Every day | ORAL | Status: DC
  Administered 2012-06-01 – 2012-06-02 (×2): 40 mg via ORAL
  Filled 2012-06-01 (×2): qty 2

## 2012-06-01 MED ORDER — SORBITOL 70 % SOLN
30.0000 mL | Freq: Every day | Status: DC | PRN
  Filled 2012-06-01: qty 30

## 2012-06-01 MED ORDER — MUPIROCIN 2 % EX OINT
1.0000 "application " | TOPICAL_OINTMENT | Freq: Two times a day (BID) | CUTANEOUS | Status: DC
  Administered 2012-06-01 – 2012-06-02 (×3): 1 via NASAL
  Filled 2012-06-01: qty 22

## 2012-06-01 MED ORDER — ONDANSETRON HCL 4 MG/2ML IJ SOLN: 4.0000 mg | Freq: Four times a day (QID) | INTRAMUSCULAR | Status: DC | PRN

## 2012-06-01 MED ORDER — ONDANSETRON HCL 4 MG PO TABS: 4.0000 mg | ORAL_TABLET | Freq: Four times a day (QID) | ORAL | Status: DC | PRN

## 2012-06-01 MED ORDER — SODIUM CHLORIDE 0.9 % IJ SOLN
3.0000 mL | Freq: Two times a day (BID) | INTRAMUSCULAR | Status: DC
  Administered 2012-06-01 – 2012-06-02 (×2): 3 mL via INTRAVENOUS

## 2012-06-01 MED ORDER — ALBUTEROL SULFATE (5 MG/ML) 0.5% IN NEBU
2.5000 mg | INHALATION_SOLUTION | RESPIRATORY_TRACT | Status: DC
  Administered 2012-06-01 – 2012-06-02 (×6): 2.5 mg via RESPIRATORY_TRACT
  Filled 2012-06-01 (×7): qty 0.5

## 2012-06-01 MED ORDER — ACETAMINOPHEN 325 MG PO TABS
650.0000 mg | ORAL_TABLET | Freq: Four times a day (QID) | ORAL | Status: DC | PRN
  Administered 2012-06-01: 650 mg via ORAL
  Filled 2012-06-01: qty 2

## 2012-06-01 MED ORDER — ASPIRIN EC 81 MG PO TBEC
81.0000 mg | DELAYED_RELEASE_TABLET | Freq: Every day | ORAL | Status: DC
  Administered 2012-06-01 – 2012-06-02 (×2): 81 mg via ORAL
  Filled 2012-06-01 (×2): qty 1

## 2012-06-01 MED ORDER — ALBUTEROL SULFATE (5 MG/ML) 0.5% IN NEBU: 5.0000 mg | INHALATION_SOLUTION | RESPIRATORY_TRACT | Status: DC | PRN

## 2012-06-01 MED ORDER — CHLORHEXIDINE GLUCONATE CLOTH 2 % EX PADS
6.0000 | MEDICATED_PAD | Freq: Every day | CUTANEOUS | Status: DC
  Administered 2012-06-01 – 2012-06-02 (×2): 6 via TOPICAL

## 2012-06-01 MED ORDER — ALBUTEROL SULFATE (5 MG/ML) 0.5% IN NEBU: 2.5000 mg | INHALATION_SOLUTION | RESPIRATORY_TRACT | Status: DC | PRN

## 2012-06-01 MED ORDER — LISINOPRIL 5 MG PO TABS
5.0000 mg | ORAL_TABLET | Freq: Every day | ORAL | Status: DC
  Administered 2012-06-01 – 2012-06-02 (×2): 5 mg via ORAL
  Filled 2012-06-01 (×2): qty 1

## 2012-06-01 MED ORDER — IPRATROPIUM BROMIDE 0.02 % IN SOLN
0.5000 mg | RESPIRATORY_TRACT | Status: DC
  Administered 2012-06-01 – 2012-06-02 (×6): 0.5 mg via RESPIRATORY_TRACT
  Filled 2012-06-01 (×7): qty 2.5

## 2012-06-01 MED ORDER — ACETAMINOPHEN 650 MG RE SUPP: 650.0000 mg | Freq: Four times a day (QID) | RECTAL | Status: DC | PRN

## 2012-06-01 MED ORDER — NICOTINE 21 MG/24HR TD PT24
21.0000 mg | MEDICATED_PATCH | Freq: Every day | TRANSDERMAL | Status: DC | PRN
  Administered 2012-06-01: 21 mg via TRANSDERMAL
  Filled 2012-06-01 (×2): qty 1

## 2012-06-01 MED ORDER — ENOXAPARIN SODIUM 40 MG/0.4ML ~~LOC~~ SOLN
40.0000 mg | SUBCUTANEOUS | Status: DC
  Administered 2012-06-01 – 2012-06-02 (×2): 40 mg via SUBCUTANEOUS
  Filled 2012-06-01 (×2): qty 0.4

## 2012-06-01 MED ORDER — GUAIFENESIN ER 600 MG PO TB12
1200.0000 mg | ORAL_TABLET | Freq: Two times a day (BID) | ORAL | Status: DC
  Administered 2012-06-01 – 2012-06-02 (×4): 1200 mg via ORAL
  Filled 2012-06-01 (×4): qty 2

## 2012-06-01 NOTE — Progress Notes (Signed)
UR Chart Review Completed  

## 2012-06-01 NOTE — H&P (Signed)
[facility] History and Physical  Marie West  ZOX:[id number]  DOB: 16-Dec-1955   DOA: 06/01/2012   PCP:   No primary provider on file.   Chief Complaint:  Progressive shortness of breath for 2 weeks  HPI: Marie West is an 56 y.o. female.   Caucasian lady with no medical followup, takes only ibuprofen for arthritis in her hands and knees, works as a Interior and spatial designer. 2 weeks ago she started noticing sharp shortness of breath at night which would be relieved by sitting up right, but then would recur after she fell asleep again. For the past week she has been having more frequent wheezing and cough, which is helped somewhat by using her granddaughter was child a dose of albuterol by nebulizer, but because she's been getting much worse over the past 24 hours she decided to come to the emergency room for evaluation.  She has been having episodic cough which is nonproductive; she has had no fever nor chills; For the past 2-3 months she has noted swelling of both lower extremities.  She denies chest pain dizziness or diaphoresis; denies nausea or vomiting; is been having left flank pain which seems to be aggravated by coughing.  Shortness of breath is not worse at work in fact it seems to be worse at home.  She smokes one pack of cigarettes per day  Family history significant for heart disease in her mother and brother  Rewiew of Systems:   All systems negative except as marked bold or noted in the HPI;  Constitutional: Negative for malaise, fever and chills. ;  Eyes: Negative for eye pain, redness and discharge. ;  ENMT: Negative for ear pain, hoarseness, nasal congestion, sinus pressure and sore throat. ;  Cardiovascular: Negative for chest pain, palpitations, diaphoresis, dyspnea and peripheral edema. ;  Gastrointestinal: Negative for nausea, vomiting, diarrhea, constipation, abdominal pain, melena, blood in stool, hematemesis, jaundice and rectal bleeding. unusual weight loss..    Genitourinary: Negative for frequency, dysuria, incontinence,flank pain and hematuria; Musculoskeletal: Negative for back pain and neck pain. Negative for swelling and trauma.;  Skin: . Negative for pruritus, rash, abrasions, bruising and skin lesion.; ulcerations Neuro: Negative for headache, lightheadedness and neck stiffness. Negative for weakness, altered level of consciousness , altered mental status, extremity weakness, burning feet, involuntary movement, seizure and syncope.  Psych: negative for anxiety, depression, insomnia, tearfulness, panic attacks, hallucinations, paranoia, suicidal or homicidal ideation    Past Medical History  Diagnosis Date  . MRSA infection   . Bronchitis     Past Surgical History  Procedure Date  . Knee surgery   . Tubal ligation     Medications:  HOME MEDS: Prior to Admission medications   Medication Sig Start Date End Date Taking? Authorizing Provider  albuterol (PROVENTIL) (2.5 MG/3ML) 0.083% nebulizer solution Take 2.5 mg by nebulization every 6 (six) hours as needed. For shortness of breath   Yes Historical Provider, MD  Cyanocobalamin (B-12 PO) Take 1 tablet by mouth daily.   Yes Historical Provider, MD  diphenhydrAMINE-zinc acetate (BENADRYL) cream Apply 1 application topically daily as needed. For rash on hands    Yes Historical Provider, MD  hydrocortisone 1 % cream Apply 1 application topically daily as needed. For rash on hands    Yes Historical Provider, MD  ibuprofen (ADVIL,MOTRIN) 200 MG tablet Take 800 mg by mouth every 4 (four) hours as needed. For pain   Yes Historical Provider, MD  Multiple Vitamin (MULTIVITAMIN WITH MINERALS) TABS Take 1 tablet  by mouth daily.   Yes Historical Provider, MD     Allergies:  Allergies  Allergen Reactions  . Bee Venom Swelling    Social History:   reports that she has been smoking Cigarettes.  She has a 36 pack-year smoking history. She does not have any smokeless tobacco history on file. She  reports that she drinks alcohol. She reports that she does not use illicit drugs.  Family History: Family History  Problem Relation Age of Onset  . Diabetes Mother   . Heart disease Mother      Physical Exam: Filed Vitals:   05/31/12 2230 05/31/12 2300 06/01/12 0106 06/01/12 0131  BP: 143/69 146/86 158/78   Pulse: 67 67 74   Temp:      TempSrc:      Resp: 18 17 22    Height:    5\' 3"  (1.6 m)  Weight:    148.326 kg (327 lb)  SpO2: 94% 96% 96%    Blood pressure 158/78, pulse 74, temperature 97.7 F (36.5 C), temperature source Oral, resp. rate 22, height 5\' 3"  (1.6 m), weight 148.326 kg (327 lb), SpO2 96.00%.  GEN:  Pleasant obese Caucasian lady lying in the stretcher ; cooperative with exam PSYCH:  alert and oriented x4; does not appear anxious or depressed; affect is appropriate. HEENT: Mucous membranes pink and anicteric; PERRLA; EOM intact; no cervical lymphadenopathy nor thyromegaly or carotid bruit;  thick neck; transmitted wheezing heard and neck Breasts:: Not examined CHEST WALL: No tenderness CHEST: Tachypneic diffuse expiratory wheezes,  HEART: Regular rate and rhythm; no murmurs rubs or gallops BACK:; no CVA tenderness ABDOMEN: Obese, soft non-tender; no masses, no organomegaly, normal abdominal bowel sounds; large pannus; no intertriginous candida. Rectal Exam: Not done EXTREMITIES:  age-appropriate arthropathy of the hands and knees; trace edema bilaterally; no ulcerations. Genitalia: not examined PULSES: 2+ and symmetric SKIN: Normal hydration no rash or ulceration CNS: Cranial nerves 2-12 grossly intact no focal lateralizing neurologic deficit   Labs on Admission:  Basic Metabolic Panel:  Lab 05/31/12 1610  NA 141  K 3.9  CL 103  CO2 31  GLUCOSE 108*  BUN 16  CREATININE 0.69  CALCIUM 9.0  MG --  PHOS --   Liver Function Tests:  Lab 05/31/12 2122  AST 28  ALT 39*  ALKPHOS 69  BILITOT 0.2*  PROT 7.3  ALBUMIN 3.8   No results found for this  basename: LIPASE:5,AMYLASE:5 in the last 168 hours No results found for this basename: AMMONIA:5 in the last 168 hours CBC:  Lab 05/31/12 2122  WBC 7.4  NEUTROABS 4.4  HGB 12.9  HCT 39.9  MCV 93.2  PLT 187   Cardiac Enzymes:  Lab 05/31/12 2122  CKTOTAL --  CKMB --  CKMBINDEX --  TROPONINI <0.30   BNP: No components found with this basename: POCBNP:5 D-dimer: No components found with this basename: D-DIMER:5 CBG: No results found for this basename: GLUCAP:5 in the last 168 hours  Radiological Exams on Admission: Dg Chest Portable 1 View  05/31/2012  *RADIOLOGY REPORT*  Clinical Data: Shortness of breath and wheezing.  PORTABLE CHEST - 1 VIEW  Comparison: 03/28/2011  Findings: Shallow inspiration.  Enlarged heart size and increased pulmonary vascularity suggesting congestive change.  No evidence of significant edema.  No blunting of costophrenic angles.  No focal consolidation.  No pneumothorax.  Changes appear to have progressed since the previous study.  IMPRESSION: Progressive cardiac enlargement and pulmonary vascular congestion since previous study.   Original  Report Authenticated By: Burman Nieves, M.D.     EKG: Independently reviewed. Sinus rhythm; left axis deviation; poor R wave progression in the anterior leads   Assessment/Plan Present on Admission:  . COPD with exacerbation, possible CHF  . Tobacco abuse . Morbid obesity . Arthritis Possible CHF Tobacco abuse   PLAN: We'll admit this lady for empiric treatment for COPD exacerbation; also on the importance to back of tobacco cessation. Often nicotine replacement  Her symptoms and chest x-ray are also suggestive of congestive heart failure; her BNP is not markedly elevated but she is rather obese. Will get a 2-D echo, and will not start IV Lasix on telemetry have echo at this time but will withhold IV fluids.  Will need counseling on diet, and will need to adjust use of any seeds if found to have  CHF.  Advice to seek health insurance since she is employed, she likely can easily be registered under the Affordable care act.  Other plans as per orders.  Code Status: FULL CODE  Family Communication:  Disposition Plan:     Marie West Nocturnist [facility] Pager [phone]   06/01/2012, 2:08 AM

## 2012-06-01 NOTE — Progress Notes (Signed)
*  PRELIMINARY RESULTS* Echocardiogram 2D Echocardiogram has been performed.  Armelia Penton Jane 06/01/2012, 2:44 PM 

## 2012-06-01 NOTE — Consult Note (Signed)
CARDIOLOGY CONSULT NOTE  Patient ID: Marie West MRN: [id number] DOB/AGE: 19-Jan-1956 56 y.o.  Admit date: 05/31/2012 Referring Physician: PTH-Gosrani Primary PhysicianNo primary provider on file. Primary Cardiologist: NEW [place] Bing Reason for Consultation:CHF with multiple CVRF  Active Problems:  COPD with exacerbation  Tobacco abuse  Morbid obesity  Arthritis  possible CHF (congestive heart failure)  HPI: Marie West is a morbidly obese 56 year old female patient with no prior cardiac history, no est. primary care physician, who has not seen a physician in years. Marie West comes to the emergency room for any acute illness. Marie West was admitted with increasing shortness of breath, wheezing, and some chest pressure. Marie West states symptoms began over the summer and included some lower extremity edema.  Marie West then noted increased shortness of breath and wheezing with cough which Marie West associated with allergies. Unfortunately over the last 2 months her symptoms have progressed to the point where Marie West is unable to walk greater than 20 feet without having to stop and catch her breath. 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 If Marie West sits down Marie West states Marie West is able to regain her breathing, but becomes short of breath easily when up and working. Over the last few days the patient has been waking in the middle of the night short of breath and wheezing heavily. Marie West denies any PND or orthopnea, but states that when Marie West awakens Marie West is wheezing and having some coughing. Marie West presented to the emergency room with these worsening symptoms on 06/01/2012. Marie West has a strong family history of coronary artery disease, with both her parents dying in their 90s of a myocardial infarction, Marie West has a younger brother who has already had bypass grafting, with other siblings with history of hypertension and diabetes. Marie West has not been diagnosed with any of these  problems so far.     Marie West is currently still easily short of breath, with wheezing walking about in the room. Cardiac enzymes found to be negative. Her pro BNP on admission was 217.3. Marie West was not found to be anemic, a blood cells were not elevated. Kidney status was normal at 0.69 with a sodium of 141 a potassium of 3.9. Marie West was found to be positive for MRSA. Chest x-ray demonstrated progressive cardiac enlargement and pulmonary vascular congestion. EKG was negative for acute ischemia. We are asked for cardiac recommendations in the setting of progressive dyspnea on exertion with family history of premature CAD and cardiovascular risk factors to include hypertension, unknown lipid status, and 38-pack-year smoking history. He is currently being treated for COPD. Echocardiogram has been ordered.   Review of systems complete and found to be negative unless listed above   Past Medical History  Diagnosis Date  . MRSA infection   . Bronchitis     Family History  Problem Relation Age of Onset  . Diabetes Mother   . Heart disease Mother     died AMI, age 43  . Heart disease Brother   . Diabetes Sister     History   Social History  . Marital Status: Legally Separated    Spouse Name: N/A    Number of Children: N/A  . Years of Education: N/A   Occupational History  . Not on file.   Social History Main Topics  . Smoking status: Current Every Day Smoker -- 1.0 packs/day for 36 years    Types: Cigarettes  . Smokeless tobacco: Not on file  .  Alcohol Use: Yes     Comment: occ-wine  . Drug Use: No  . Sexually Active: Yes    Birth Control/ Protection: Surgical   Other Topics Concern  . Not on file   Social History Narrative  . No narrative on file    Past Surgical History  Procedure Date  . Knee surgery   . Tubal ligation     Prescriptions prior to admission  Medication Sig Dispense Refill  . albuterol (PROVENTIL) (2.5 MG/3ML) 0.083% nebulizer solution Take 2.5 mg by nebulization  every 6 (six) hours as needed. For shortness of breath      . Cyanocobalamin (B-12 PO) Take 1 tablet by mouth daily.      . diphenhydrAMINE-zinc acetate (BENADRYL) cream Apply 1 application topically daily as needed. For rash on hands       . hydrocortisone 1 % cream Apply 1 application topically daily as needed. For rash on hands       . ibuprofen (ADVIL,MOTRIN) 200 MG tablet Take 800 mg by mouth every 4 (four) hours as needed. For pain      . Multiple Vitamin (MULTIVITAMIN WITH MINERALS) TABS Take 1 tablet by mouth daily.       Physical Exam: Blood pressure 198/93, pulse 70, temperature 98.3 F (36.8 C), temperature source Oral, resp. rate 20, height 5\' 3"  (1.6 m), weight 327 lb (148.326 kg), SpO2 90.00%.  General: Well developed, well nourished, in no acute distress, morbidly obese Head: Eyes PERRLA, No xanthomas.   Normal cephalic and atramatic  Lungs: Inspiratory crackles, with expiratory wheezes, frequent coughing.  Heart: HRRR S1 S2, distant heart sounds,without MRG.  Pulses are 2+ & equal.            No carotid bruit. Neck obese, unable to assess for  JVD.  No abdominal bruits. No femoral bruits. Abdomen: Bowel sounds are positive, abdomen soft and non-tender without masses or                  Hernia's noted. Msk:  Back normal, normal gait. Normal strength and tone for age. Extremities: No clubbing, cyanosis or edema.  DP +1 Neuro: Alert and oriented X 3. Psych:  Good affect, responds appropriately.Tearful easily when talking about family history. Labs:   Lab Results  Component Value Date   WBC 6.0 06/01/2012   HGB 13.8 06/01/2012   HCT 43.1 06/01/2012   MCV 92.3 06/01/2012   PLT 217 06/01/2012    Lab 06/01/12 0452  NA 141  K 4.0  CL 102  CO2 28  BUN 13  CREATININE 0.57  CALCIUM 9.2  PROT 7.6  BILITOT 0.2*  ALKPHOS 73  ALT 39*  AST 22  GLUCOSE 158*   Lab Results  Component Value Date   TROPONINI <0.30 05/31/2012    Radiology: Dg Chest Portable 1  View  05/31/2012  *RADIOLOGY REPORT*  Clinical Data: Shortness of breath and wheezing.  PORTABLE CHEST - 1 VIEW  Comparison: 03/28/2011  Findings: Shallow inspiration.  Enlarged heart size and increased pulmonary vascularity suggesting congestive change.  No evidence of significant edema.  No blunting of costophrenic angles.  No focal consolidation.  No pneumothorax.  Changes appear to have progressed since the previous study.  IMPRESSION: Progressive cardiac enlargement and pulmonary vascular congestion since previous study.   Original Report Authenticated By: Burman Nieves, M.D.    EKG: NSR; delayed R-wave progression; nonspecific ST-T wave abnormality.  Echocardiogram: LVH with normal LV systolic function; no significant valvular abnormalities  ASSESSMENT AND PLAN:   1. Progressive Dyspnea: Uncertain at present if this is related to cardiac etiology. Marie West is massively obese, with multiple cardiovascular risk factors, to include but not limited to family history, hypertension, tobacco abuse, and unknown lipid status, or thyroid disease. Echocardiogram has been ordered. EKG shows no acute ischemia or past ischemic events. Marie West does have a left axis deviation probably related to long-standing hypertension that has been undiagnosed. Cardiac enzymes are negative. BNP was in the low 200s, however chest x-ray did reveal some pulmonary vascular suggests. It is possible that has evidence of diastolic heart failure. Will place on low-dose diuretic Lasix 20 mg daily and, add ACE inhibitor, lisinopril 5 mg daily. We will make further recommendations for ongoing cardiac testing after echocardiogram is completed. We will check TSH, fasting lipids in a.m. for ongoing resuscitation..  2. Hypertension: Blood pressure currently uncontrolled. We will vital signs since admission shows blood pressure in the 170s to 198 systolic. Marie West is not on any antihypertensives at home. As stated above, we will add low-dose diuretic  and ACE inhibitor. We will watch kidney function on the medications.  3. Tobacco abuse: Significant cardiovascular risk factor. Marie West may well also be experiencing COPD with increasing dyspnea on exertion. Marie West has been advised on smoking cessation, and has a nicotine patch topically.  4. Morbid Obesity: Weight loss with calorie restriction this is recommended. Marie West is currently been placed on a heart healthy diet. TSH will be checked as Marie West does have a family history of hypothyroidism on her mother side.  Marie West snores, is markedly overweight and has some daytime somnolence as well as frequent middle of the night awakening raising the question of sleep apnea.  Bettey Mare. Lyman Bishop NP [facility] 06/01/2012, 10:35 AM  Cardiology Attending Patient interviewed and examined. Discussed with Joni Reining, NP.  Above note annotated and modified based upon my findings.  Neither exam, BNP level nor echocardiogram suggest cardiac etiology for current symptoms.  Chest x-ray is equivocal and difficult due to patient's size.  Pulmonary status is improved despite only a modest diuresis. I would recommend treatment for bronchospasm and no further cardiac investigation unless symptoms persist.  ABG will be performed to rule out Pickwickian Syndrome. Sleep study should be performed at some point.  Adrian Bing, MD 06/01/2012, 5:16 PM

## 2012-06-01 NOTE — Progress Notes (Signed)
Notified Dr. Kerry Hough about pts c/o severe headache.  She states she takes 800mg  of Ib profen at home.  I voiced to MD via text.  Call returned and he stated that the patient could not take Ib profen due to her possibly having new CHF.  He states she can only take Tylenol at this time.  I voiced this to the pt and offered her the Tylenol she refused it stating that the headache was easing up.

## 2012-06-01 NOTE — Progress Notes (Signed)
     Subjective: This lady presents to the hospital with a two-week history of dyspnea. Nonproductive cough also. She also describes ankle swelling. Chest x-ray on admission was suggestive of cardiomegaly and pulmonary edema. She has improved overnight with diuretic and intravenous steroids.           Physical Exam: Blood pressure 186/81, pulse 68, temperature 98.6 F (37 C), temperature source Oral, resp. rate 20, height 5\' 3"  (1.6 m), weight 148.326 kg (327 lb), SpO2 96.00%. She looks systemically well at rest. There is no increased work of breathing. Lung fields show scattered inspiratory wheezing with some crackles. Heart sounds are present and normal without gallop rhythm. There is no significant peripheral pitting edema in her ankles. She is alert and orientated. She is morbidly obese.   Investigations:  No results found for this or any previous visit (from the past 240 hour(s)).   Basic Metabolic Panel:  Basename 06/01/12 0452 05/31/12 2122  NA 141 141  K 4.0 3.9  CL 102 103  CO2 28 31  GLUCOSE 158* 108*  BUN 13 16  CREATININE 0.57 0.69  CALCIUM 9.2 9.0  MG -- --  PHOS -- --   Liver Function Tests:  [facility] 06/01/12 0452 05/31/12 2122  AST 22 28  ALT 39* 39*  ALKPHOS 73 69  BILITOT 0.2* 0.2*  PROT 7.6 7.3  ALBUMIN 4.0 3.8     CBC:  Basename 06/01/12 0452 05/31/12 2122  WBC 6.0 7.4  NEUTROABS -- 4.4  HGB 13.8 12.9  HCT 43.1 39.9  MCV 92.3 93.2  PLT 217 187    Dg Chest Portable 1 View  05/31/2012  *RADIOLOGY REPORT*  Clinical Data: Shortness of breath and wheezing.  PORTABLE CHEST - 1 VIEW  Comparison: 03/28/2011  Findings: Shallow inspiration.  Enlarged heart size and increased pulmonary vascularity suggesting congestive change.  No evidence of significant edema.  No blunting of costophrenic angles.  No focal consolidation.  No pneumothorax.  Changes appear to have progressed since the previous study.  IMPRESSION: Progressive cardiac enlargement  and pulmonary vascular congestion since previous study.   Original Report Authenticated By: Burman Nieves, M.D.       Medications: I have reviewed the patient's current medications.  Impression: 1. COPD exacerbation. 2. Tobacco abuse, ongoing. 3. Possible congestive heart failure. BNP is mildly elevated. However, she has a strong family history of early coronary artery disease and multiple risk factors for ischemic heart disease. 4. Morbid obesity.     Plan: 1. Start oral steroids and oral antibiotics. 2. Await echocardiogram. 3. Cardiology consultation.     LOS: 1 day   Wilson Singer Pager [phone]  06/01/2012, 8:33 AM

## 2012-06-01 NOTE — Care Management Note (Unsigned)
    Page 1 of 1   06/01/2012     11:30:53 AM   CARE MANAGEMENT NOTE 06/01/2012  Patient:  Marie West, Marie West   Account Number:  [id number]  Date Initiated:  06/01/2012  Documentation initiated by:  Sharrie Rothman  Subjective/Objective Assessment:   Pt admitted from home with COPD exacerbation. Pt lives with her husband and will return home with him at discharge. Pt is independent with ADL's. Pt stated that she uses her daughters neb machine.     Action/Plan:   CM gave pt number to [facility] of [place]. Also, financial counselor notified of pts self pay status. CM will need to help pt with d/c meds.   Anticipated DC Date:  06/04/2012   Anticipated DC Plan:  HOME/SELF CARE  In-house referral  Financial Counselor      DC Planning Services  CM consult      Choice offered to / List presented to:             Status of service:  In process, will continue to follow Medicare Important Message given?   (If response is "NO", the following Medicare IM given date fields will be blank) Date Medicare IM given:   Date Additional Medicare IM given:    Discharge Disposition:    Per UR Regulation:    If discussed at Long Length of Stay Meetings, dates discussed:    Comments:  06/01/12 1130 Arlyss Queen, RN BSN CM

## 2012-06-01 NOTE — Clinical Social Work Note (Signed)
CSW received referral for assistance with insurance and medications. Financial counselor notified and CM aware of possible need for medication assistance. CSW signing off but can be reconsulted if needed.  Derenda Fennel, [place] [phone]

## 2012-06-02 LAB — COMPREHENSIVE METABOLIC PANEL
ALT: 26 U/L (ref 0–35)
AST: 13 U/L (ref 0–37)
Albumin: 3.3 g/dL — ABNORMAL LOW (ref 3.5–5.2)
Alkaline Phosphatase: 60 U/L (ref 39–117)
BUN: 19 mg/dL (ref 6–23)
CO2: 29 mEq/L (ref 19–32)
Calcium: 8.4 mg/dL (ref 8.4–10.5)
Chloride: 102 mEq/L (ref 96–112)
Creatinine, Ser: 0.68 mg/dL (ref 0.50–1.10)
GFR calc Af Amer: >90 mL/min (ref >90)
GFR calc non Af Amer: >90 mL/min (ref >90)
Glucose, Bld: 103 mg/dL — ABNORMAL HIGH (ref 70–99)
Potassium: 3.5 mEq/L (ref 3.5–5.1)
Sodium: 140 mEq/L (ref 135–145)
Total Bilirubin: 0.2 mg/dL — ABNORMAL LOW (ref 0.3–1.2)
Total Protein: 6.7 g/dL (ref 6.0–8.3)

## 2012-06-02 LAB — CBC
HCT: 39.1 % (ref 36.0–46.0)
Hemoglobin: 12.9 g/dL (ref 12.0–15.0)
MCH: 30.3 pg (ref 26.0–34.0)
MCHC: 33 g/dL (ref 30.0–36.0)
MCV: 91.8 fL (ref 78.0–100.0)
Platelets: 207 10*3/uL (ref 150–400)
RBC: 4.26 MIL/uL (ref 3.87–5.11)
RDW: 14.4 % (ref 11.5–15.5)
WBC: 8.7 10*3/uL (ref 4.0–10.5)

## 2012-06-02 LAB — LIPID PANEL
Cholesterol: 193 mg/dL (ref 0–200)
HDL: 66 mg/dL (ref >39)
LDL Cholesterol: 105 mg/dL — ABNORMAL HIGH (ref 0–99)
Total CHOL/HDL Ratio: 2.9 RATIO
Triglycerides: 112 mg/dL (ref <150)
VLDL: 22 mg/dL (ref 0–40)

## 2012-06-02 LAB — BLOOD GAS, ARTERIAL
Acid-Base Excess: 4.6 mmol/L — ABNORMAL HIGH (ref 0.0–2.0)
Bicarbonate: 29.6 mEq/L — ABNORMAL HIGH (ref 20.0–24.0)
O2 Saturation: 91.3 %
Patient temperature: 37
TCO2: 26.5 mmol/L (ref 0–100)
pCO2 arterial: 51.9 mmHg — ABNORMAL HIGH (ref 35.0–45.0)
pH, Arterial: 7.374 (ref 7.350–7.450)
pO2, Arterial: 63.3 mmHg — ABNORMAL LOW (ref 80.0–100.0)

## 2012-06-02 MED ORDER — LEVOFLOXACIN 500 MG PO TABS: 500.0000 mg | ORAL_TABLET | Freq: Every day | ORAL | Status: DC

## 2012-06-02 MED ORDER — THYROID 30 MG PO TABS: 30.0000 mg | ORAL_TABLET | Freq: Every day | ORAL | Status: DC

## 2012-06-02 MED ORDER — LISINOPRIL 5 MG PO TABS: 5.0000 mg | ORAL_TABLET | Freq: Every day | ORAL | Status: DC

## 2012-06-02 MED ORDER — NICOTINE 21 MG/24HR TD PT24: 1.0000 | MEDICATED_PATCH | Freq: Every day | TRANSDERMAL | Status: DC | PRN

## 2012-06-02 MED ORDER — PREDNISONE 20 MG PO TABS: ORAL_TABLET | ORAL | Status: DC

## 2012-06-02 NOTE — Discharge Summary (Signed)
Physician Discharge Summary  Marie West WUJ:[id number] DOB: April 19, 1956 DOA: 05/31/2012    Admit date: 05/31/2012 Discharge date: 06/02/2012  Time spent: Greater than 30 minutes  Recommendations for Outpatient Follow-up:  1. Patient will need to establish with a primary care physician in the community for followup.   Discharge Diagnoses:  1. COPD exacerbation, improving. 2. Hypothyroidism. 3. Morbid obesity. 4. Tobacco abuse. 5. Hypertension.   Discharge Condition: Stable and improved.  Diet recommendation: Low glycemic index nutrition.  Filed Weights   05/31/12 2031 06/01/12 0131 06/02/12 0631  Weight: 108.863 kg (240 lb) 148.326 kg (327 lb) 146.285 kg (322 lb 8 oz)    History of present illness:  This pleasant 56 year old lady presents to the hospital with symptoms of progressive shortness of breath for 2 weeks. Please see initial history as outlined below: Marie West is an 56 y.o. female. Caucasian lady with no medical followup, takes only ibuprofen for arthritis in her hands and knees, works as a Interior and spatial designer. 2 weeks ago she started noticing sharp shortness of breath at night which would be relieved by sitting up right, but then would recur after she fell asleep again. For the past week she has been having more frequent wheezing and cough, which is helped somewhat by using her granddaughter was child a dose of albuterol by nebulizer, but because she's been getting much worse over the past 24 hours she decided to come to the emergency room for evaluation.  She has been having episodic cough which is nonproductive; she has had no fever nor chills;  For the past 2-3 months she has noted swelling of both lower extremities.  She denies chest pain dizziness or diaphoresis; denies nausea or vomiting; is been having left flank pain which seems to be aggravated by coughing.  Shortness of breath is not worse at work in fact it seems to be worse at home.  She smokes one pack of  cigarettes per day  Family history significant for heart disease in her mother and brother  Hospital Course:  The patient was admitted and started on steroids and antibiotics. She was also put on Lasix in view of abnormal chest x-ray indicating congestive heart failure. However clinical findings did not support that. She had an echocardiogram which was really unremarkable and was within normal limits. She was seen by cardiology Dr. Dietrich Pates, who agreed with this assessment that she did not really have evidence of cardiac disease, in particular no evidence of congestive heart failure. Arterial blood gases showed some hypoventilation, consistent with her obesity. Blood work was interesting for elevated TSH representing hypothyroidism. She has been started on Armour Thyroid 30 mg daily. She is going to be sent home with tapering course of oral steroids and antibiotics for further 5 days.  Procedures:  Echocardiogram:  Study Conclusions  - Left ventricle: The cavity size was at the upper limits of normal. There was mild concentric hypertrophy. Systolic function was normal. The estimated ejection fraction was in the range of 55% to 60%. Wall motion was normal; there were no regional wall motion abnormalities. - Mitral valve: Calcified annulus. Transthoracic echocardiography. M-mode, complete 2D, spectral Doppler, and color Doppler. Height: Height: 160cm. Height: 63in. Weight: Weight: 148.3kg. Weight: 326.3lb. Body mass index: BMI: 57.9kg/m^2. Body surface area: BSA: 2.35m^2. Patient status: Inpatient. Location: Bedside.    Consultations:  Cardiology, Dr. Dietrich Pates.  Discharge Exam: Filed Vitals:   06/01/12 2103 06/01/12 2226 06/02/12 0631 06/02/12 0717  BP:  145/72 161/71   Pulse:  83 63   Temp:  98.8 F (37.1 C) 98.3 F (36.8 C)   TempSrc:  Oral    Resp:  20 20   Height:      Weight:   146.285 kg (322 lb 8 oz)   SpO2: 96% 91% 92% 94%    General: She looks systemically well.  She is not toxic or septic. Cardiovascular: Heart sounds are present without any gallop rhythm or murmurs. Respiratory: Lung fields show bilateral wheezing which is probably her baseline but her chest is not tight. There are no crackles. She is alert and orientated.  Discharge Instructions  Discharge Orders    Future Orders Please Complete By Expires   Diet - low sodium heart healthy      Increase activity slowly          Medication List     As of 06/02/2012  9:04 AM    TAKE these medications         albuterol (2.5 MG/3ML) 0.083% nebulizer solution   Commonly known as: PROVENTIL   Take 2.5 mg by nebulization every 6 (six) hours as needed. For shortness of breath      B-12 PO   Take 1 tablet by mouth daily.      BENADRYL cream   Generic drug: diphenhydrAMINE-zinc acetate   Apply 1 application topically daily as needed. For rash on hands      hydrocortisone cream 1 %   Apply 1 application topically daily as needed. For rash on hands      ibuprofen 200 MG tablet   Commonly known as: ADVIL,MOTRIN   Take 800 mg by mouth every 4 (four) hours as needed. For pain      levofloxacin 500 MG tablet   Commonly known as: LEVAQUIN   Take 1 tablet (500 mg total) by mouth daily.      lisinopril 5 MG tablet   Commonly known as: PRINIVIL,ZESTRIL   Take 1 tablet (5 mg total) by mouth daily.      multivitamin with minerals Tabs   Take 1 tablet by mouth daily.      nicotine 21 mg/24hr patch   Commonly known as: NICODERM CQ - dosed in mg/24 hours   Place 1 patch onto the skin daily as needed (nicotine withdrawal).      predniSONE 20 MG tablet   Commonly known as: DELTASONE   Take 2 tablets daily for 3 days, then 1 tablet daily for 3 days, then half tablet daily for 3 days, then STOP.      thyroid 30 MG tablet   Commonly known as: ARMOUR   Take 1 tablet (30 mg total) by mouth daily.          The results of significant diagnostics from this hospitalization (including imaging,  microbiology, ancillary and laboratory) are listed below for reference.    Significant Diagnostic Studies: Dg Chest Portable 1 View  05/31/2012  *RADIOLOGY REPORT*  Clinical Data: Shortness of breath and wheezing.  PORTABLE CHEST - 1 VIEW  Comparison: 03/28/2011  Findings: Shallow inspiration.  Enlarged heart size and increased pulmonary vascularity suggesting congestive change.  No evidence of significant edema.  No blunting of costophrenic angles.  No focal consolidation.  No pneumothorax.  Changes appear to have progressed since the previous study.  IMPRESSION: Progressive cardiac enlargement and pulmonary vascular congestion since previous study.   Original Report Authenticated By: Burman Nieves, M.D.     Microbiology: Recent Results (from the past 240 hour(s))  MRSA  PCR SCREENING     Status: Abnormal   Collection Time   06/01/12  2:13 AM      Component Value Range Status Comment   MRSA by PCR   (*) NEGATIVE Final    Value: CRITICAL RESULT CALLED TO, READ BACK BY AND VERIFIED WITH:     Labs: Basic Metabolic Panel:  Lab 06/02/12 4540 06/01/12 0452 05/31/12 2122  NA 140 141 141  K 3.5 4.0 3.9  CL 102 102 103  CO2 29 28 31   GLUCOSE 103* 158* 108*  BUN 19 13 16   CREATININE 0.68 0.57 0.69  CALCIUM 8.4 9.2 9.0  MG -- -- --  PHOS -- -- --   Liver Function Tests:  Lab 06/02/12 0621 06/01/12 0452 05/31/12 2122  AST 13 22 28   ALT 26 39* 39*  ALKPHOS 60 73 69  BILITOT 0.2* 0.2* 0.2*  PROT 6.7 7.6 7.3  ALBUMIN 3.3* 4.0 3.8   TSH is 7.5.   CBC:  Lab 06/02/12 0621 06/01/12 0452 05/31/12 2122  WBC 8.7 6.0 7.4  NEUTROABS -- -- 4.4  HGB 12.9 13.8 12.9  HCT 39.1 43.1 39.9  MCV 91.8 92.3 93.2  PLT 207 217 187   Cardiac Enzymes:  Lab 05/31/12 2122  CKTOTAL --  CKMB --  CKMBINDEX --  TROPONINI <0.30   BNP: BNP (last 3 results)  Basename 05/31/12 2122  PROBNP 217.3*         Signed:  GOSRANI,NIMISH C  [facility] 06/02/2012, 9:04 AM

## 2013-03-12 ENCOUNTER — Emergency Department (HOSPITAL_COMMUNITY)
Admission: EM | Admit: 2013-03-12 | Discharge: 2013-03-12 | Disposition: A | Discharge status: Home / Self Care | Payer: Self-pay | Attending: Emergency Medicine | Admitting: Emergency Medicine

## 2013-03-12 ENCOUNTER — Emergency Department (HOSPITAL_COMMUNITY): Payer: Self-pay

## 2013-03-12 ENCOUNTER — Encounter (HOSPITAL_COMMUNITY): Payer: Self-pay

## 2013-03-12 DIAGNOSIS — Z8709 Personal history of other diseases of the respiratory system: Secondary | ICD-10-CM | POA: Insufficient documentation

## 2013-03-12 DIAGNOSIS — F17200 Nicotine dependence, unspecified, uncomplicated: Secondary | ICD-10-CM | POA: Insufficient documentation

## 2013-03-12 DIAGNOSIS — Z79899 Other long term (current) drug therapy: Secondary | ICD-10-CM | POA: Insufficient documentation

## 2013-03-12 DIAGNOSIS — IMO0002 Reserved for concepts with insufficient information to code with codable children: Secondary | ICD-10-CM | POA: Insufficient documentation

## 2013-03-12 DIAGNOSIS — Z792 Long term (current) use of antibiotics: Secondary | ICD-10-CM | POA: Insufficient documentation

## 2013-03-12 DIAGNOSIS — H3320 Serous retinal detachment, unspecified eye: Secondary | ICD-10-CM | POA: Insufficient documentation

## 2013-03-12 DIAGNOSIS — Z8614 Personal history of Methicillin resistant Staphylococcus aureus infection: Secondary | ICD-10-CM | POA: Insufficient documentation

## 2013-03-12 DIAGNOSIS — H5789 Other specified disorders of eye and adnexa: Secondary | ICD-10-CM | POA: Insufficient documentation

## 2013-03-12 DIAGNOSIS — R51 Headache: Secondary | ICD-10-CM | POA: Insufficient documentation

## 2013-03-12 DIAGNOSIS — H3321 Serous retinal detachment, right eye: Secondary | ICD-10-CM

## 2013-03-12 MED ORDER — OXYCODONE-ACETAMINOPHEN 5-325 MG PO TABS: 1.0000 | ORAL_TABLET | ORAL | Status: DC | PRN

## 2013-03-12 NOTE — ED Notes (Signed)
Discharge instructions given and reviewed with patient.  Percocet pre-pack given; effects and use explained.  Patient verbalized understanding of sedating effects of Percocet and to be at Dr. Ephriam Knuckles office in the morning and to not eat or drink anything after midnight.  Patient ambulatory; discharged home in good condition.

## 2013-03-12 NOTE — ED Notes (Signed)
Pt reports bent over at work today around 1200 and since then has had impaired vision in r eye and headache.  Pt says vision in R eye is like when you look at a bright light and then look away.

## 2013-03-12 NOTE — ED Notes (Signed)
No CT scan per EDP

## 2013-03-12 NOTE — ED Provider Notes (Signed)
CSN: [id number]     Arrival date & time 03/12/13  1745 History  This chart was scribed for Raeford Razor, MD by Bennett Scrape, ED Scribe. This patient was seen in room APA09/APA09 and the patient's care was started at 6:02 PM.   Chief Complaint  Patient presents with  . Headache  . visual changes     The history is provided by the patient. No language interpreter was used.    HPI Comments: MUSETTE KISAMORE is a 57 y.o. female who presents to the Emergency Department complaining of sudden onset, gradually worsening visual disturbance of the right eye with an associated HA located at the vertex. Pt describes that there is a reddish-brown spot in the middle of her vision that started after a flash of light while bending over at work today around 12p. She states that she at first thought that it was a FB and she has tried washing her eye with no improvement. She wears glasses but denies any contact use. She has chronic watery eyes and denies changes. She denies any prior chronic eye conditions.   Past Medical History  Diagnosis Date  . MRSA infection   . Bronchitis    Past Surgical History  Procedure Laterality Date  . Knee surgery    . Tubal ligation     Family History  Problem Relation Age of Onset  . Diabetes Mother   . Heart disease Mother     died AMI, age 8  . Heart disease Brother   . Diabetes Sister    History  Substance Use Topics  . Smoking status: Current Every Day Smoker -- 1.00 packs/day for 36 years    Types: Cigarettes  . Smokeless tobacco: Not on file  . Alcohol Use: Yes     Comment: occ-wine   OB History   Grav Para Term Preterm Abortions TAB SAB Ect Mult Living                 Review of Systems  Eyes: Positive for discharge (chronic, no changes) and visual disturbance. Negative for pain.  Gastrointestinal: Negative for nausea and vomiting.  Neurological: Positive for headaches. Negative for weakness.  All other systems reviewed and are  negative.    Allergies  Bee venom  Home Medications   Current Outpatient Rx  Name  Route  Sig  Dispense  Refill  . albuterol (PROVENTIL) (2.5 MG/3ML) 0.083% nebulizer solution   Nebulization   Take 2.5 mg by nebulization every 6 (six) hours as needed. For shortness of breath         . Cyanocobalamin (B-12 PO)   Oral   Take 1 tablet by mouth daily.         . diphenhydrAMINE-zinc acetate (BENADRYL) cream   Topical   Apply 1 application topically daily as needed. For rash on hands          . hydrocortisone 1 % cream   Topical   Apply 1 application topically daily as needed. For rash on hands          . ibuprofen (ADVIL,MOTRIN) 200 MG tablet   Oral   Take 800 mg by mouth every 4 (four) hours as needed. For pain         . levofloxacin (LEVAQUIN) 500 MG tablet   Oral   Take 1 tablet (500 mg total) by mouth daily.   5 tablet   0   . lisinopril (PRINIVIL,ZESTRIL) 5 MG tablet   Oral   Take  1 tablet (5 mg total) by mouth daily.   30 tablet   0   . Multiple Vitamin (MULTIVITAMIN WITH MINERALS) TABS   Oral   Take 1 tablet by mouth daily.         . nicotine (NICODERM CQ - DOSED IN MG/24 HOURS) 21 mg/24hr patch   Transdermal   Place 1 patch onto the skin daily as needed (nicotine withdrawal).   28 patch   0   . predniSONE (DELTASONE) 20 MG tablet      Take 2 tablets daily for 3 days, then 1 tablet daily for 3 days, then half tablet daily for 3 days, then STOP.   12 tablet   0   . thyroid (ARMOUR THYROID) 30 MG tablet   Oral   Take 1 tablet (30 mg total) by mouth daily.   30 tablet   0    Triage Vitals: BP 173/95  Pulse 72  Temp(Src) 98.5 F (36.9 C)  Resp 18  Ht 5\' 3"  (1.6 m)  Wt 290 lb (131.543 kg)  BMI 51.38 kg/m2  SpO2 99%  Physical Exam  Nursing note and vitals reviewed. Constitutional: She is oriented to person, place, and time. She appears well-developed and well-nourished. No distress.  HENT:  Head: Normocephalic and atraumatic.   Periorbital tissues are normal  Eyes: Conjunctivae, EOM and lids are normal. Pupils are equal, round, and reactive to light. Lids are everted and swept, no foreign bodies found. Right eye exhibits no chemosis, no discharge and no hordeolum. No foreign body present in the right eye. Left eye exhibits no chemosis, no discharge and no hordeolum. No foreign body present in the left eye. Right conjunctiva is not injected. Right conjunctiva has no hemorrhage. Left conjunctiva is not injected. Left conjunctiva has no hemorrhage. Right eye exhibits normal extraocular motion and no nystagmus. Left eye exhibits normal extraocular motion and no nystagmus. Right pupil is round and reactive. Left pupil is round and reactive. Pupils are equal.    Anterior chambers are clear. Conjunctiva clear. Lids/lashes unremarkable. Bedside US with bubbling appearance of  R retina.   Neck: Neck supple. No tracheal deviation present.  Cardiovascular: Normal rate and regular rhythm.   Pulmonary/Chest: Effort normal and breath sounds normal. No respiratory distress.  Musculoskeletal: Normal range of motion.  Neurological: She is alert and oriented to person, place, and time. No cranial nerve deficit.  Negative Romberg's   Skin: Skin is warm and dry.  Psychiatric: She has a normal mood and affect. Her behavior is normal.    ED Course  Procedures (including critical care time)  DIAGNOSTIC STUDIES: Oxygen Saturation is 99% on room air, normal by my interpretation.    COORDINATION OF CARE: 6:06 PM-Discussed smoking cessation with pt who states that she is trying to quit. Discussed treatment plan which includes US of the eye with pt at bedside and pt agreed to plan.   6:08 PM-Retinal detachment of the right eye noted on [place]. Discussed plan to consult opthalmology with pt to determine further treatment plan and pt agreed.   Labs Review Labs Reviewed - No data to display Imaging Review No results found.  MDM   1.  Retinal detachment, right    Discussed with on call optho and then subsequently Dr Luciana Axe. Will see pt in office tomorrow morning. NPO after midnight.   I personally preformed the services scribed in my presence. The recorded information has been reviewed is accurate. Raeford Razor, MD.    Raeford Razor,  MD 03/19/13 1708

## 2013-03-12 NOTE — ED Notes (Signed)
Dr. Juleen China aware of pt.

## 2013-03-19 MED FILL — Oxycodone w/ Acetaminophen Tab 5-325 MG: ORAL | Qty: 6 | Status: AC

## 2013-04-22 ENCOUNTER — Encounter (HOSPITAL_COMMUNITY): Payer: Self-pay | Admitting: Emergency Medicine

## 2013-04-22 ENCOUNTER — Emergency Department (HOSPITAL_COMMUNITY)
Admission: EM | Admit: 2013-04-22 | Discharge: 2013-04-22 | Disposition: A | Discharge status: Home / Self Care | Payer: Self-pay | Attending: Emergency Medicine | Admitting: Emergency Medicine

## 2013-04-22 DIAGNOSIS — K029 Dental caries, unspecified: Secondary | ICD-10-CM | POA: Insufficient documentation

## 2013-04-22 DIAGNOSIS — F17200 Nicotine dependence, unspecified, uncomplicated: Secondary | ICD-10-CM | POA: Insufficient documentation

## 2013-04-22 DIAGNOSIS — Z79899 Other long term (current) drug therapy: Secondary | ICD-10-CM | POA: Insufficient documentation

## 2013-04-22 DIAGNOSIS — K047 Periapical abscess without sinus: Secondary | ICD-10-CM | POA: Insufficient documentation

## 2013-04-22 DIAGNOSIS — R51 Headache: Secondary | ICD-10-CM | POA: Insufficient documentation

## 2013-04-22 DIAGNOSIS — Z8614 Personal history of Methicillin resistant Staphylococcus aureus infection: Secondary | ICD-10-CM | POA: Insufficient documentation

## 2013-04-22 DIAGNOSIS — Z8709 Personal history of other diseases of the respiratory system: Secondary | ICD-10-CM | POA: Insufficient documentation

## 2013-04-22 MED ORDER — HYDROCODONE-ACETAMINOPHEN 5-325 MG PO TABS: 2.0000 | ORAL_TABLET | ORAL | Status: DC | PRN

## 2013-04-22 MED ORDER — LIDOCAINE HCL (PF) 1 % IJ SOLN
5.0000 mL | Freq: Once | INTRAMUSCULAR | Status: DC
  Filled 2013-04-22: qty 5

## 2013-04-22 MED ORDER — AMOXICILLIN 500 MG PO CAPS: 500.0000 mg | ORAL_CAPSULE | Freq: Three times a day (TID) | ORAL | Status: DC

## 2013-04-22 NOTE — ED Provider Notes (Signed)
CSN: [id number]     Arrival date & time 04/22/13  1404 History   First MD Initiated Contact with Patient 04/22/13 1433     Chief Complaint  Patient presents with  . Facial Swelling   (Consider location/radiation/quality/duration/timing/severity/associated sxs/prior Treatment) HPI Comments: Patient presents to the ER for evaluation of mouth pain and facial swelling which began yesterday. Symptoms have worsened today. Patient reports that she has not teeth that have caused her problems in the area. She has a swelling of the thumb just above the teeth on the left upper side. She reports that she is now experiencing throbbing headache on the entire left side of her head. She has not had any drainage from the site.   Past Medical History  Diagnosis Date  . MRSA infection   . Bronchitis    Past Surgical History  Procedure Laterality Date  . Knee surgery    . Tubal ligation     Family History  Problem Relation Age of Onset  . Diabetes Mother   . Heart disease Mother     died AMI, age 110  . Heart disease Brother   . Diabetes Sister    History  Substance Use Topics  . Smoking status: Current Every Day Smoker -- 1.00 packs/day for 36 years    Types: Cigarettes  . Smokeless tobacco: Not on file  . Alcohol Use: Yes     Comment: occ-wine   OB History   Grav Para Term Preterm Abortions TAB SAB Ect Mult Living                 Review of Systems  HENT: Positive for dental problem and facial swelling.   Neurological: Positive for headaches.  All other systems reviewed and are negative.    Allergies  Bee venom  Home Medications   Current Outpatient Rx  Name  Route  Sig  Dispense  Refill  . albuterol (PROVENTIL) (2.5 MG/3ML) 0.083% nebulizer solution   Nebulization   Take 2.5 mg by nebulization every 6 (six) hours as needed. For shortness of breath         . cholecalciferol (VITAMIN D) 1000 UNITS tablet   Oral   Take 1,000 Units by mouth daily.         . hydroxypropyl  methylcellulose (ISOPTO TEARS) 2.5 % ophthalmic solution   Ophthalmic   Apply 1 drop to eye as needed (for eye irritation).         Marland Kitchen ibuprofen (ADVIL,MOTRIN) 200 MG tablet   Oral   Take 800 mg by mouth every 4 (four) hours as needed. For pain         . thyroid (ARMOUR THYROID) 30 MG tablet   Oral   Take 1 tablet (30 mg total) by mouth daily.   30 tablet   0    BP 156/100  Pulse 92  Temp(Src) 98.7 F (37.1 C) (Oral)  Resp 19  SpO2 93% Physical Exam  Constitutional: She is oriented to person, place, and time. She appears well-developed and well-nourished. No distress.  HENT:  Head: Normocephalic and atraumatic.  Right Ear: Hearing normal.  Left Ear: Hearing normal.  Nose: Nose normal.  Mouth/Throat: Oropharynx is clear and moist and mucous membranes are normal. Dental abscesses and dental caries present.    Eyes: Conjunctivae and EOM are normal. Pupils are equal, round, and reactive to light.  Neck: Normal range of motion. Neck supple.  Cardiovascular: Regular rhythm, S1 normal and S2 normal.  Exam reveals no  gallop and no friction rub.   No murmur heard. Pulmonary/Chest: Effort normal and breath sounds normal. No respiratory distress. She exhibits no tenderness.  Abdominal: Soft. Normal appearance and bowel sounds are normal. There is no hepatosplenomegaly. There is no tenderness. There is no rebound, no guarding, no tenderness at McBurney's point and negative Murphy's sign. No hernia.  Musculoskeletal: Normal range of motion.  Neurological: She is alert and oriented to person, place, and time. She has normal strength. No cranial nerve deficit or sensory deficit. Coordination normal. GCS eye subscore is 4. GCS verbal subscore is 5. GCS motor subscore is 6.  Skin: Skin is warm, dry and intact. No rash noted. No cyanosis.  Psychiatric: She has a normal mood and affect. Her speech is normal and behavior is normal. Thought content normal.    ED Course  Procedures  (including critical care time)  Drainage of dental abscess: Area was anesthetized with 1% lidocaine. A 25-gauge needle was inserted into the periapical region and less than 0.5 mL of 1% lidocaine was injected. The area was then incised with an 18-gauge needle. Small amount of purulent material drained. Patient tolerated the procedure well. No complications.  Labs Review Labs Reviewed - No data to display Imaging Review No results found.  EKG Interpretation   None       MDM  Diagnosis: Dental abscess  Patient presents to the ER for evaluation of pain and swelling of the left side of her face associated with a toothache. She has evidence of a dental abscess. Drainage was performed as outlined above. Patient will be treated with amoxicillin and hydrocodone. Followup with dentist.    Gilda Crease, MD 04/22/13 1525

## 2013-04-22 NOTE — ED Notes (Signed)
Pt c/o swelling and pain to left side of face. Slight swelling noted. States doesn't know if toothache or sinuses. States now whole left side of head hurts. Nad.

## 2013-05-06 ENCOUNTER — Encounter (HOSPITAL_COMMUNITY): Payer: Self-pay | Admitting: Emergency Medicine

## 2013-05-06 ENCOUNTER — Emergency Department (HOSPITAL_COMMUNITY): Payer: Self-pay

## 2013-05-06 ENCOUNTER — Emergency Department (HOSPITAL_COMMUNITY)
Admission: EM | Admit: 2013-05-06 | Discharge: 2013-05-06 | Disposition: A | Discharge status: Home / Self Care | Payer: Self-pay | Attending: Emergency Medicine | Admitting: Emergency Medicine

## 2013-05-06 DIAGNOSIS — Z79899 Other long term (current) drug therapy: Secondary | ICD-10-CM | POA: Insufficient documentation

## 2013-05-06 DIAGNOSIS — F17200 Nicotine dependence, unspecified, uncomplicated: Secondary | ICD-10-CM | POA: Insufficient documentation

## 2013-05-06 DIAGNOSIS — R071 Chest pain on breathing: Secondary | ICD-10-CM | POA: Insufficient documentation

## 2013-05-06 DIAGNOSIS — Z8709 Personal history of other diseases of the respiratory system: Secondary | ICD-10-CM | POA: Insufficient documentation

## 2013-05-06 DIAGNOSIS — Z8614 Personal history of Methicillin resistant Staphylococcus aureus infection: Secondary | ICD-10-CM | POA: Insufficient documentation

## 2013-05-06 DIAGNOSIS — R0789 Other chest pain: Secondary | ICD-10-CM

## 2013-05-06 DIAGNOSIS — I10 Essential (primary) hypertension: Secondary | ICD-10-CM | POA: Insufficient documentation

## 2013-05-06 DIAGNOSIS — Z792 Long term (current) use of antibiotics: Secondary | ICD-10-CM | POA: Insufficient documentation

## 2013-05-06 MED ORDER — HYDROCHLOROTHIAZIDE 25 MG PO TABS: 25.0000 mg | ORAL_TABLET | Freq: Every day | ORAL | Status: DC

## 2013-05-06 MED ORDER — TRAMADOL HCL 50 MG PO TABS: 50.0000 mg | ORAL_TABLET | Freq: Four times a day (QID) | ORAL | Status: DC | PRN

## 2013-05-06 MED ORDER — NAPROXEN 500 MG PO TABS: 500.0000 mg | ORAL_TABLET | Freq: Two times a day (BID) | ORAL | Status: DC

## 2013-05-06 NOTE — ED Notes (Signed)
Pt states pain to right chest since waking yesterday morning. Pain is worse when taking a deep breath, states pain will then radiates to the back . Bending over also makes the pain much worse.

## 2013-05-06 NOTE — ED Provider Notes (Signed)
CSN: [id number]     Arrival date & time 05/06/13  1326 History   First MD Initiated Contact with Patient 05/06/13 1403     Chief Complaint  Patient presents with  . Chest Pain    HPI  Chest pain. Saturday night she fell asleep on the couch with her right arm over a call condition and leaning against her right arm. Since that her shoulder at 90 her elbow flexed and her head down to her right fold arm. She waking at 8 hours of sleep .  She had pain to move her arm or chest or shoulder. Her chest still hurts it is tender to touch and hurts to breathe.  No shortness of breath. No fevers. No cough. Does not feel poorly in any other way  Past Medical History  Diagnosis Date  . MRSA infection   . Bronchitis    Past Surgical History  Procedure Laterality Date  . Knee surgery    . Tubal ligation     Family History  Problem Relation Age of Onset  . Diabetes Mother   . Heart disease Mother     died AMI, age 62  . Heart disease Brother   . Diabetes Sister    History  Substance Use Topics  . Smoking status: Current Every Day Smoker -- 1.00 packs/day for 36 years    Types: Cigarettes  . Smokeless tobacco: Not on file  . Alcohol Use: Yes     Comment: occ-wine   OB History   Grav Para Term Preterm Abortions TAB SAB Ect Mult Living                 Review of Systems  Constitutional: Negative for fever, chills, diaphoresis, appetite change and fatigue.  HENT: Negative for mouth sores, sore throat and trouble swallowing.   Eyes: Negative for visual disturbance.  Respiratory: Negative for cough, chest tightness, shortness of breath and wheezing.   Cardiovascular: Positive for chest pain.       Pain to touch or move in the right lateral chest  Gastrointestinal: Negative for nausea, vomiting, abdominal pain, diarrhea and abdominal distention.  Endocrine: Negative for polydipsia, polyphagia and polyuria.  Genitourinary: Negative for dysuria, frequency and hematuria.  Musculoskeletal:  Negative for gait problem.  Skin: Negative for color change, pallor and rash.  Neurological: Negative for dizziness, syncope, light-headedness and headaches.  Hematological: Does not bruise/bleed easily.  Psychiatric/Behavioral: Negative for behavioral problems and confusion.    Allergies  Bee venom  Home Medications   Current Outpatient Rx  Name  Route  Sig  Dispense  Refill  . albuterol (PROVENTIL) (2.5 MG/3ML) 0.083% nebulizer solution   Nebulization   Take 2.5 mg by nebulization every 6 (six) hours as needed. For shortness of breath         . amoxicillin (AMOXIL) 500 MG capsule   Oral   Take 1 capsule (500 mg total) by mouth 3 (three) times daily.   30 capsule   0   . Aspirin-Salicylamide-Caffeine (BC HEADACHE POWDER PO)   Oral   Take 1 packet by mouth daily as needed (pain).         . cholecalciferol (VITAMIN D) 1000 UNITS tablet   Oral   Take 1,000 Units by mouth daily.         Marland Kitchen HYDROcodone-acetaminophen (NORCO/VICODIN) 5-325 MG per tablet   Oral   Take 2 tablets by mouth every 4 (four) hours as needed for pain.   10 tablet  0   . thyroid (ARMOUR THYROID) 30 MG tablet   Oral   Take 1 tablet (30 mg total) by mouth daily.   30 tablet   0   . hydrochlorothiazide (HYDRODIURIL) 25 MG tablet   Oral   Take 1 tablet (25 mg total) by mouth daily.   30 tablet   1   . ibuprofen (ADVIL,MOTRIN) 200 MG tablet   Oral   Take 800 mg by mouth every 4 (four) hours as needed. For pain         . naproxen (NAPROSYN) 500 MG tablet   Oral   Take 1 tablet (500 mg total) by mouth 2 (two) times daily.   30 tablet   0   . traMADol (ULTRAM) 50 MG tablet   Oral   Take 1 tablet (50 mg total) by mouth every 6 (six) hours as needed.   15 tablet   0    BP 176/93  Temp(Src) 98.4 F (36.9 C) (Oral)  Resp 17  Ht 5\' 2"  (1.575 m)  Wt 300 lb (136.079 kg)  BMI 54.86 kg/m2  SpO2 96% Physical Exam  Constitutional: She is oriented to person, place, and time. She  appears well-developed and well-nourished. No distress.    HENT:  Head: Normocephalic.  Eyes: Conjunctivae are normal. Pupils are equal, round, and reactive to light. No scleral icterus.  Neck: Normal range of motion. Neck supple. No thyromegaly present.  Cardiovascular: Normal rate and regular rhythm.  Exam reveals no gallop and no friction rub.   No murmur heard. Pulmonary/Chest: Effort normal and breath sounds normal. No respiratory distress. She has no wheezes. She has no rales.      Abdominal: Soft. Bowel sounds are normal. She exhibits no distension. There is no tenderness. There is no rebound.  Musculoskeletal: Normal range of motion.  Lymphadenopathy:  No edema  Neurological: She is alert and oriented to person, place, and time.  Skin: Skin is warm and dry. No rash noted.  Psychiatric: She has a normal mood and affect. Her behavior is normal.    ED Course  Procedures (including critical care time) Labs Review Labs Reviewed - No data to display Imaging Review Dg Chest 2 View  05/06/2013   CLINICAL DATA:  Chest pain  EXAM: CHEST  2 VIEW  COMPARISON:  May 31, 2012  FINDINGS: There is mild scarring in the left lower lobe. The lungs are otherwise clear. Heart is moderately enlarged with normal pulmonary vascularity. No adenopathy. There is degenerative change in the thoracic spine.  IMPRESSION: Persistent cardiomegaly.  No edema or consolidation.   Electronically Signed   By: Bretta Bang M.D.   On: 05/06/2013 14:09    EKG Interpretation    Date/Time:  Monday May 06 2013 13:44:00 EST Ventricular Rate:  75 PR Interval:  162 QRS Duration: 94 QT Interval:  410 QTC Calculation: 457 R Axis:   72 Text Interpretation:  Normal sinus rhythm Normal ECG Flattened T waves inferiorly. Not ST changes.            MDM   1. Chest wall pain   2. Hypertension     EKG shows no acute or ischemic changes. Sinus rhythm. Chest x-ray shows no infiltrates or  effusion. Does have cardiomegaly which is unchanged versus comparison. She is noncompliant with her blood pressure medicine. This will be refilled. She's not hypoxemic. She is not febrile. She is not tachycardic. This is reproducible pleuritic positional pain after sleeping awkwardly. This is entirely  consistent with musculoskeletal pain family limited amount of pain medication anti-inflammatories. Given her a refill on her blood pressure medications.    Roney Marion, MD 05/06/13 [phone]

## 2013-07-09 ENCOUNTER — Inpatient Hospital Stay (HOSPITAL_COMMUNITY)
Admission: EM | Admit: 2013-07-09 | Discharge: 2013-07-11 | DRG: 193 | Disposition: A | Discharge status: Home / Self Care | Payer: Self-pay | Attending: Family Medicine | Admitting: Family Medicine

## 2013-07-09 ENCOUNTER — Encounter (HOSPITAL_COMMUNITY): Payer: Self-pay | Admitting: Emergency Medicine

## 2013-07-09 ENCOUNTER — Emergency Department (HOSPITAL_COMMUNITY): Payer: Self-pay

## 2013-07-09 DIAGNOSIS — F17200 Nicotine dependence, unspecified, uncomplicated: Secondary | ICD-10-CM | POA: Diagnosis present

## 2013-07-09 DIAGNOSIS — Z720 Tobacco use: Secondary | ICD-10-CM

## 2013-07-09 DIAGNOSIS — I509 Heart failure, unspecified: Secondary | ICD-10-CM

## 2013-07-09 DIAGNOSIS — J441 Chronic obstructive pulmonary disease with (acute) exacerbation: Secondary | ICD-10-CM | POA: Diagnosis present

## 2013-07-09 DIAGNOSIS — Z8249 Family history of ischemic heart disease and other diseases of the circulatory system: Secondary | ICD-10-CM

## 2013-07-09 DIAGNOSIS — Z833 Family history of diabetes mellitus: Secondary | ICD-10-CM

## 2013-07-09 DIAGNOSIS — Z23 Encounter for immunization: Secondary | ICD-10-CM

## 2013-07-09 DIAGNOSIS — M1990 Unspecified osteoarthritis, unspecified site: Secondary | ICD-10-CM | POA: Diagnosis present

## 2013-07-09 DIAGNOSIS — J9601 Acute respiratory failure with hypoxia: Secondary | ICD-10-CM | POA: Diagnosis present

## 2013-07-09 DIAGNOSIS — J90 Pleural effusion, not elsewhere classified: Secondary | ICD-10-CM | POA: Diagnosis present

## 2013-07-09 DIAGNOSIS — J9600 Acute respiratory failure, unspecified whether with hypoxia or hypercapnia: Secondary | ICD-10-CM | POA: Diagnosis present

## 2013-07-09 DIAGNOSIS — J918 Pleural effusion in other conditions classified elsewhere: Secondary | ICD-10-CM

## 2013-07-09 DIAGNOSIS — I10 Essential (primary) hypertension: Secondary | ICD-10-CM | POA: Diagnosis present

## 2013-07-09 DIAGNOSIS — M129 Arthropathy, unspecified: Secondary | ICD-10-CM | POA: Diagnosis present

## 2013-07-09 DIAGNOSIS — Z6841 Body Mass Index (BMI) 40.0 and over, adult: Secondary | ICD-10-CM

## 2013-07-09 DIAGNOSIS — I160 Hypertensive urgency: Secondary | ICD-10-CM | POA: Diagnosis present

## 2013-07-09 DIAGNOSIS — J189 Pneumonia, unspecified organism: Principal | ICD-10-CM | POA: Diagnosis present

## 2013-07-09 HISTORY — DX: Pneumonia, unspecified organism: J18.9

## 2013-07-09 HISTORY — DX: Headache: R51

## 2013-07-09 HISTORY — DX: Shortness of breath: R06.02

## 2013-07-09 HISTORY — DX: Serous retinal detachment, unspecified eye: H33.20

## 2013-07-09 HISTORY — DX: Fibromyalgia: M79.7

## 2013-07-09 HISTORY — DX: Acute respiratory failure, unspecified whether with hypoxia or hypercapnia: J96.00

## 2013-07-09 HISTORY — DX: Heart failure, unspecified: I50.9

## 2013-07-09 HISTORY — DX: Hypertensive urgency: I16.0

## 2013-07-09 HISTORY — DX: Essential (primary) hypertension: I10

## 2013-07-09 HISTORY — DX: Unspecified osteoarthritis, unspecified site: M19.90

## 2013-07-09 HISTORY — DX: Thyrotoxicosis, unspecified without thyrotoxic crisis or storm: E05.90

## 2013-07-09 HISTORY — DX: Chronic obstructive pulmonary disease with (acute) exacerbation: J44.1

## 2013-07-09 LAB — INFLUENZA PANEL BY PCR (TYPE A & B)
H1N1 flu by pcr: NOT DETECTED
Influenza A By PCR: NEGATIVE
Influenza B By PCR: NEGATIVE

## 2013-07-09 LAB — CBC WITH DIFFERENTIAL/PLATELET
Basophils Absolute: 0 10*3/uL (ref 0.0–0.1)
Basophils Relative: 1 % (ref 0–1)
Eosinophils Absolute: 0.3 10*3/uL (ref 0.0–0.7)
Eosinophils Relative: 4 % (ref 0–5)
HCT: 43.5 % (ref 36.0–46.0)
Hemoglobin: 13.6 g/dL (ref 12.0–15.0)
Lymphocytes Relative: 19 % (ref 12–46)
Lymphs Abs: 1.4 10*3/uL (ref 0.7–4.0)
MCH: 28.9 pg (ref 26.0–34.0)
MCHC: 31.3 g/dL (ref 30.0–36.0)
MCV: 92.6 fL (ref 78.0–100.0)
Monocytes Absolute: 0.4 10*3/uL (ref 0.1–1.0)
Monocytes Relative: 6 % (ref 3–12)
Neutro Abs: 5 10*3/uL (ref 1.7–7.7)
Neutrophils Relative %: 70 % (ref 43–77)
Platelets: 236 10*3/uL (ref 150–400)
RBC: 4.7 MIL/uL (ref 3.87–5.11)
RDW: 15.1 % (ref 11.5–15.5)
WBC: 7.1 10*3/uL (ref 4.0–10.5)

## 2013-07-09 LAB — BASIC METABOLIC PANEL
BUN: 12 mg/dL (ref 6–23)
CO2: 34 mEq/L — ABNORMAL HIGH (ref 19–32)
Calcium: 9.1 mg/dL (ref 8.4–10.5)
Chloride: 100 mEq/L (ref 96–112)
Creatinine, Ser: 0.58 mg/dL (ref 0.50–1.10)
GFR calc Af Amer: >90 mL/min (ref >90)
GFR calc non Af Amer: >90 mL/min (ref >90)
Glucose, Bld: 107 mg/dL — ABNORMAL HIGH (ref 70–99)
Potassium: 4.8 mEq/L (ref 3.7–5.3)
Sodium: 142 mEq/L (ref 137–147)

## 2013-07-09 LAB — TROPONIN I: Troponin I: <0.3 ng/mL (ref <0.30)

## 2013-07-09 MED ORDER — ENOXAPARIN SODIUM 80 MG/0.8ML ~~LOC~~ SOLN
70.0000 mg | SUBCUTANEOUS | Status: DC
  Administered 2013-07-09 – 2013-07-10 (×2): 70 mg via SUBCUTANEOUS
  Filled 2013-07-09 (×2): qty 0.8

## 2013-07-09 MED ORDER — ALBUTEROL SULFATE (2.5 MG/3ML) 0.083% IN NEBU: 2.5000 mg | INHALATION_SOLUTION | RESPIRATORY_TRACT | Status: DC

## 2013-07-09 MED ORDER — DEXTROSE 5 % IV SOLN
500.0000 mg | INTRAVENOUS | Status: DC
  Administered 2013-07-10: 500 mg via INTRAVENOUS
  Filled 2013-07-09 (×2): qty 500

## 2013-07-09 MED ORDER — HYDRALAZINE HCL 20 MG/ML IJ SOLN
5.0000 mg | Freq: Once | INTRAMUSCULAR | Status: AC
  Administered 2013-07-09: 5 mg via INTRAVENOUS
  Filled 2013-07-09: qty 1

## 2013-07-09 MED ORDER — SODIUM CHLORIDE 0.9 % IV SOLN: 250.0000 mL | INTRAVENOUS | Status: DC | PRN

## 2013-07-09 MED ORDER — DEXTROSE 5 % IV SOLN
2000.0000 mg | Freq: Once | INTRAVENOUS | Status: DC
  Filled 2013-07-09: qty 2

## 2013-07-09 MED ORDER — DEXTROSE 5 % IV SOLN
2.0000 g | INTRAVENOUS | Status: DC
  Administered 2013-07-09 – 2013-07-10 (×2): 2 g via INTRAVENOUS
  Filled 2013-07-09 (×2): qty 2

## 2013-07-09 MED ORDER — GUAIFENESIN ER 600 MG PO TB12
1200.0000 mg | ORAL_TABLET | Freq: Two times a day (BID) | ORAL | Status: DC
  Administered 2013-07-09 – 2013-07-11 (×4): 1200 mg via ORAL
  Filled 2013-07-09 (×4): qty 2

## 2013-07-09 MED ORDER — HYDROCODONE-ACETAMINOPHEN 5-325 MG PO TABS
1.0000 | ORAL_TABLET | ORAL | Status: DC | PRN
  Administered 2013-07-09 – 2013-07-11 (×4): 1 via ORAL
  Filled 2013-07-09 (×4): qty 1

## 2013-07-09 MED ORDER — AZITHROMYCIN 500 MG IV SOLR
500.0000 mg | Freq: Once | INTRAVENOUS | Status: AC
  Administered 2013-07-09: 500 mg via INTRAVENOUS

## 2013-07-09 MED ORDER — IPRATROPIUM BROMIDE 0.02 % IN SOLN: 0.5000 mg | RESPIRATORY_TRACT | Status: DC

## 2013-07-09 MED ORDER — AMLODIPINE BESYLATE 5 MG PO TABS
5.0000 mg | ORAL_TABLET | Freq: Every day | ORAL | Status: DC
  Administered 2013-07-09 – 2013-07-11 (×3): 5 mg via ORAL
  Filled 2013-07-09 (×3): qty 1

## 2013-07-09 MED ORDER — IPRATROPIUM BROMIDE 0.02 % IN SOLN
0.5000 mg | Freq: Once | RESPIRATORY_TRACT | Status: AC
  Administered 2013-07-09: 0.5 mg via RESPIRATORY_TRACT
  Filled 2013-07-09: qty 2.5

## 2013-07-09 MED ORDER — HYDROCHLOROTHIAZIDE 25 MG PO TABS: 25.0000 mg | ORAL_TABLET | Freq: Every day | ORAL | Status: DC

## 2013-07-09 MED ORDER — INFLUENZA VAC SPLIT QUAD 0.5 ML IM SUSP
0.5000 mL | INTRAMUSCULAR | Status: AC
  Administered 2013-07-10: 0.5 mL via INTRAMUSCULAR
  Filled 2013-07-09: qty 0.5

## 2013-07-09 MED ORDER — METHYLPREDNISOLONE SODIUM SUCC 125 MG IJ SOLR
60.0000 mg | Freq: Two times a day (BID) | INTRAMUSCULAR | Status: AC
  Administered 2013-07-10 (×2): 60 mg via INTRAVENOUS
  Filled 2013-07-09 (×2): qty 2

## 2013-07-09 MED ORDER — OXYCODONE-ACETAMINOPHEN 5-325 MG PO TABS
1.0000 | ORAL_TABLET | Freq: Once | ORAL | Status: AC
  Administered 2013-07-09: 1 via ORAL
  Filled 2013-07-09: qty 1

## 2013-07-09 MED ORDER — PREDNISONE 50 MG PO TABS
60.0000 mg | ORAL_TABLET | Freq: Once | ORAL | Status: AC
  Administered 2013-07-09: 60 mg via ORAL
  Filled 2013-07-09 (×2): qty 1

## 2013-07-09 MED ORDER — ACETAMINOPHEN 650 MG RE SUPP: 650.0000 mg | Freq: Four times a day (QID) | RECTAL | Status: DC | PRN

## 2013-07-09 MED ORDER — METHYLPREDNISOLONE SODIUM SUCC 125 MG IJ SOLR
60.0000 mg | Freq: Four times a day (QID) | INTRAMUSCULAR | Status: DC
Start: 2013-07-09 — End: 2013-07-09
  Administered 2013-07-09: 60 mg via INTRAVENOUS
  Filled 2013-07-09: qty 2

## 2013-07-09 MED ORDER — LISINOPRIL 5 MG PO TABS
5.0000 mg | ORAL_TABLET | Freq: Every day | ORAL | Status: DC
  Administered 2013-07-09 – 2013-07-11 (×3): 5 mg via ORAL
  Filled 2013-07-09 (×3): qty 1

## 2013-07-09 MED ORDER — MORPHINE SULFATE 2 MG/ML IJ SOLN
1.0000 mg | INTRAMUSCULAR | Status: DC | PRN
  Filled 2013-07-09: qty 1

## 2013-07-09 MED ORDER — SENNOSIDES-DOCUSATE SODIUM 8.6-50 MG PO TABS: 1.0000 | ORAL_TABLET | Freq: Every evening | ORAL | Status: DC | PRN

## 2013-07-09 MED ORDER — ALUM & MAG HYDROXIDE-SIMETH 200-200-20 MG/5ML PO SUSP: 30.0000 mL | Freq: Four times a day (QID) | ORAL | Status: DC | PRN

## 2013-07-09 MED ORDER — ALBUTEROL SULFATE (2.5 MG/3ML) 0.083% IN NEBU: 2.5000 mg | INHALATION_SOLUTION | RESPIRATORY_TRACT | Status: DC | PRN

## 2013-07-09 MED ORDER — ALBUTEROL (5 MG/ML) CONTINUOUS INHALATION SOLN
10.0000 mg/h | INHALATION_SOLUTION | Freq: Once | RESPIRATORY_TRACT | Status: AC
  Administered 2013-07-09: 10 mg/h via RESPIRATORY_TRACT
  Filled 2013-07-09: qty 20

## 2013-07-09 MED ORDER — ACETAMINOPHEN 325 MG PO TABS
650.0000 mg | ORAL_TABLET | Freq: Four times a day (QID) | ORAL | Status: DC | PRN
  Filled 2013-07-09: qty 2

## 2013-07-09 MED ORDER — ONDANSETRON HCL 4 MG PO TABS: 4.0000 mg | ORAL_TABLET | Freq: Four times a day (QID) | ORAL | Status: DC | PRN

## 2013-07-09 MED ORDER — ENOXAPARIN SODIUM 40 MG/0.4ML ~~LOC~~ SOLN: 40.0000 mg | SUBCUTANEOUS | Status: DC

## 2013-07-09 MED ORDER — BISACODYL 10 MG RE SUPP: 10.0000 mg | Freq: Every day | RECTAL | Status: DC | PRN

## 2013-07-09 MED ORDER — ALBUTEROL SULFATE (2.5 MG/3ML) 0.083% IN NEBU
5.0000 mg | INHALATION_SOLUTION | Freq: Once | RESPIRATORY_TRACT | Status: AC
  Administered 2013-07-09: 5 mg via RESPIRATORY_TRACT
  Filled 2013-07-09: qty 6

## 2013-07-09 MED ORDER — ONDANSETRON HCL 4 MG/2ML IJ SOLN: 4.0000 mg | Freq: Four times a day (QID) | INTRAMUSCULAR | Status: DC | PRN

## 2013-07-09 MED ORDER — IPRATROPIUM-ALBUTEROL 0.5-2.5 (3) MG/3ML IN SOLN
3.0000 mL | RESPIRATORY_TRACT | Status: DC
  Administered 2013-07-09 – 2013-07-10 (×6): 3 mL via RESPIRATORY_TRACT
  Filled 2013-07-09 (×6): qty 3

## 2013-07-09 MED ORDER — SODIUM CHLORIDE 0.9 % IJ SOLN
3.0000 mL | Freq: Two times a day (BID) | INTRAMUSCULAR | Status: DC
  Administered 2013-07-09 – 2013-07-11 (×4): 3 mL via INTRAVENOUS

## 2013-07-09 MED ORDER — HYDRALAZINE HCL 20 MG/ML IJ SOLN: 10.0000 mg | INTRAMUSCULAR | Status: DC | PRN

## 2013-07-09 MED ORDER — HYDRALAZINE HCL 20 MG/ML IJ SOLN: 2.0000 mg | INTRAMUSCULAR | Status: DC | PRN

## 2013-07-09 MED ORDER — SODIUM CHLORIDE 0.9 % IJ SOLN: 3.0000 mL | INTRAMUSCULAR | Status: DC | PRN

## 2013-07-09 MED ORDER — THYROID 30 MG PO TABS
30.0000 mg | ORAL_TABLET | Freq: Every day | ORAL | Status: DC
  Administered 2013-07-10 – 2013-07-11 (×2): 30 mg via ORAL
  Filled 2013-07-09 (×4): qty 1

## 2013-07-09 NOTE — H&P (Signed)
[facility] History and Physical  Marie West ZOX:[id number] DOB: [birth year] DOA: 07/09/2013  Referring physician: Bebe Shaggy PCP: No PCP Per Patient   Chief Complaint: sob  HPI: Marie West is a very pleasant 58 y.o. female with a past medical history that includes bronchitis, hypertension, presents to the emergency room with a chief complaint of a four-day history gradually worsening shortness of breath and associated wheezing. Information is obtained from the patient. She reports that she gradually developed shortness of breath about 4 days ago and decided to come to the emergency room when she could not walk from the bedroom to the bathroom without becoming short of breath. Is not on home oxygen. She does not have home nebulizers. Associated symptoms include occasional mild nonproductive cough, headache and chest pain particularly with coughing. She denies any fever chills abdominal pain nausea vomiting diarrhea. She denies any worsening lower extremity edema. She states that she typically sleeps propped up on 2 pillows. She is a former smoker and is been trying to quit for 2 months. She had admission in December she was discharged with lisinopril and HCTZ neither of which she's been taking because she could not get refills and still has no PCP. In the emergency room she was found to become hypoxic at 84% with ambulation to the bathroom, chest x-ray yields right basilar airspace disease and associated right pleural effusion with concern for pneumonia. Vital signs are significant for blood pressure 186/111. In the emergency room she was given 60 mg of prednisone, azithromycin and Rocephin intravenously, oxygen supplementation and nebulizers. At the time of my exam she was feeling much better. [facility] asked to admit   Review of Systems:  Negative for visual disturbances, palpitations, abdominal pain, nausea, vomiting, dysuria, diarrhea, melena Past Medical History  Diagnosis  Date  . MRSA infection   . Bronchitis   . Hypertension   . Hypertensive urgency   . Acute respiratory failure   . COPD with exacerbation    Past Surgical History  Procedure Laterality Date  . Knee surgery    . Tubal ligation     Social History:  reports that she has been smoking Cigarettes.  She has a 36 pack-year smoking history. She does not have any smokeless tobacco history on file. She reports that she drinks alcohol. She reports that she does not use illicit drugs. She is employed as a Scientist, research (medical). Dependent with ADLs Allergies  Allergen Reactions  . Bee Venom Swelling    Family History  Problem Relation Age of Onset  . Diabetes Mother   . Heart disease Mother     died AMI, age 21  . Heart disease Brother   . Diabetes Sister      Prior to Admission medications   Medication Sig Start Date End Date Taking? Authorizing Provider  ibuprofen (ADVIL,MOTRIN) 200 MG tablet Take 800 mg by mouth every 4 (four) hours as needed. For pain   Yes Historical Provider, MD  albuterol (PROVENTIL) (2.5 MG/3ML) 0.083% nebulizer solution Take 2.5 mg by nebulization every 6 (six) hours as needed. For shortness of breath    Historical Provider, MD  Aspirin-Salicylamide-Caffeine (BC HEADACHE POWDER PO) Take 1 packet by mouth daily as needed (pain).    Historical Provider, MD  cholecalciferol (VITAMIN D) 1000 UNITS tablet Take 1,000 Units by mouth daily.    Historical Provider, MD  hydrochlorothiazide (HYDRODIURIL) 25 MG tablet Take 1 tablet (25 mg total) by mouth daily. 05/06/13   Rolland Porter, MD  thyroid (  ARMOUR THYROID) 30 MG tablet Take 1 tablet (30 mg total) by mouth daily. 06/02/12   Nimish Normajean Glasgow, MD   Physical Exam: Filed Vitals:   07/09/13 1414  BP: 186/111  Pulse: 73  Temp:   Resp: 21    BP 186/111  Pulse 73  Temp(Src) 98.1 F (36.7 C) (Oral)  Resp 21  SpO2 95%  General:  Appears calm and comfortable morbidly obese Eyes: PERRL, normal lids, irises & conjunctiva ENT:  grossly normal hearing, lips & tongue his membranes of her mouth are pink slightly dry Neck: no LAD, masses or thyromegaly Cardiovascular: RRR, no m/r/g. Trace lower extremity edema  Respiratory: Mild increased work of breathing with conversation. Breath sounds somewhat distant and tight. Faint end expiratory wheeze auscultated particularly in bases. I hear no crackles Abdomen: Obese soft positive bowel sounds nontender to palpation Skin: no rash or induration seen on limited exam Musculoskeletal: grossly normal tone BUE/BLE Psychiatric: grossly normal mood and affect, speech fluent and appropriate Neurologic: grossly non-focal.          Labs on Admission:  Basic Metabolic Panel:  Recent Labs Lab 07/09/13 1217  NA 142  K 4.8  CL 100  CO2 34*  GLUCOSE 107*  BUN 12  CREATININE 0.58  CALCIUM 9.1   Liver Function Tests: No results found for this basename: AST, ALT, ALKPHOS, BILITOT, PROT, ALBUMIN,  in the last 168 hours No results found for this basename: LIPASE, AMYLASE,  in the last 168 hours No results found for this basename: AMMONIA,  in the last 168 hours CBC:  Recent Labs Lab 07/09/13 1217  WBC 7.1  NEUTROABS 5.0  HGB 13.6  HCT 43.5  MCV 92.6  PLT 236   Cardiac Enzymes: No results found for this basename: CKTOTAL, CKMB, CKMBINDEX, TROPONINI,  in the last 168 hours  BNP (last 3 results) No results found for this basename: PROBNP,  in the last 8760 hours CBG: No results found for this basename: GLUCAP,  in the last 168 hours  Radiological Exams on Admission: Dg Chest 2 View  07/09/2013   CLINICAL DATA:  Shortness of breath and chest pain  EXAM: CHEST  2 VIEW  COMPARISON:  05/06/2013  FINDINGS: Moderately enlarged cardiopericardial silhouette appears similar to radiographs from November 2014. There is pulmonary vascular congestion. There is airspace disease at the right lung base, which partially obscures the lateral right hemidiaphragm. Hazy opacity is seen  posteriorly on the lateral view, favored to be due to a right pleural effusion. There is peribronchial thickening at the left lung base, but no discrete left basilar opacity is seen.  IMPRESSION: Findings suspicious for right basilar airspace disease and associated right pleural effusion. Pneumonia with parapneumonic effusion cannot be excluded. Suggest clinical correlation. Image quality is degraded by patient body habitus.  Stable cardiomegaly.   Electronically Signed   By: Britta Mccreedy M.D.   On: 07/09/2013 13:34    EKG: pending  Assessment/Plan Principal Problem:   Acute respiratory failure: Likely related to COPD exacerbation as well as right pleural effusion and possible pneumonia. Oxygen saturation levels improved at time of my exam. On admission patient oxygen saturation level 95% on 2 L. Will continue with nebulizers and add Solu-Medrol. Continue oxygen supplementation and monitor closely. Active Problems: CAP (community acquired pneumonia): Will continue the Rocephin and azithromycin started in the emergency room. Will get blood cultures if she spikes a temperature to 101. Currently she is afebrile with a normal white count and nontoxic appearing.  Check strep pneumo urine as well as Legionella urine.      Hypertensive urgency: Chart review indicates patient was discharged recently with lisinopril and HCTZ. She's been unable to get these 2 medications refilled and has no PCP. I will start her back on her home dose of lisinopril and HCTZ. Will give her one dose of hydralazine intravenously right now and provide when necessary hydralazine with parameters until her blood pressure is more controlled. Will request case management to get her set up with the primary care provider    COPD with exacerbation: See above. Oxygen saturation levels improved after nebulizer treatments and with oxygen supplementation continue nebs and Solu-Medrol. Continue oxygen supplementation. Antibiotics as above. Monitor  close   Pleural effusion: The above therapies. Reviewed by Dr. Kerry HoughMemon. Does not appear to require draining at this time. Will monitor.     Morbid obesity: check BMI. Request nutritional consult    Arthritis: appears stable at baseline.       Code Status: full Family Communication: none present Disposition Plan: home likely day or 2  Time spent: 65 minutes  [facility] Center LLCBLACK,Brelyn Woehl M [facility]

## 2013-07-09 NOTE — ED Notes (Signed)
Short of breath for the past 4 days, worse with exertion

## 2013-07-09 NOTE — ED Notes (Signed)
Pt requesting pain med for headache. EDP aware

## 2013-07-09 NOTE — Progress Notes (Signed)
ANTIBIOTIC CONSULT NOTE - INITIAL  Pharmacy Consult for Rocephin Indication: rule out pneumonia  Allergies  Allergen Reactions  . Bee Venom Swelling    Patient Measurements:   Last recorded weight :  136Kg  Vital Signs: Temp: 98.1 F (36.7 C) (01/20 1128) Temp src: Oral (01/20 1128) BP: 186/111 mmHg (01/20 1414) Pulse Rate: 73 (01/20 1414) Intake/Output from previous day:   Intake/Output from this shift:    Labs:  Recent Labs  07/09/13 1217  WBC 7.1  HGB 13.6  PLT 236  CREATININE 0.58   The CrCl is unknown because both a height and weight (above a minimum accepted value) are required for this calculation. No results found for this basename: VANCOTROUGH, VANCOPEAK, VANCORANDOM, GENTTROUGH, GENTPEAK, GENTRANDOM, TOBRATROUGH, TOBRAPEAK, TOBRARND, AMIKACINPEAK, AMIKACINTROU, AMIKACIN,  in the last 72 hours   Microbiology: No results found for this or any previous visit (from the past 720 hour(s)).  Medical History: Past Medical History  Diagnosis Date  . MRSA infection   . Bronchitis   . Hypertension   . Hypertensive urgency   . Acute respiratory failure   . COPD with exacerbation   . Retinal detachment     Medications:  Scheduled:  . azithromycin (ZITHROMAX) 500 MG IVPB  500 mg Intravenous Once  . [START ON 07/10/2013] azithromycin  500 mg Intravenous Q24H  . cefTRIAXone (ROCEPHIN)  IV  2 g Intravenous Q24H  . enoxaparin (LOVENOX) injection  40 mg Subcutaneous Q24H  . hydrochlorothiazide  25 mg Oral Daily  . ipratropium-albuterol  3 mL Nebulization Q4H  . lisinopril  5 mg Oral Daily  . methylPREDNISolone (SOLU-MEDROL) injection  60 mg Intravenous Q6H  . sodium chloride  3 mL Intravenous Q12H  . thyroid  30 mg Oral Daily   Assessment: 57yo obese female admitted for worsening SOB and wheezing, suspected pneumonia.  SCr = 0.58.  The CrCl is unknown because both a height and weight (above a minimum accepted value) are required for this calculation.  Pt being  started on Zithromax and Rocephin.  Goal of Therapy:  Eradicate infection.  Plan:  Rocephin 2gm IV q24hrs Monitor labs, renal fxn, and cultures  Valrie HartHall, Hale Chalfin A 07/09/2013,3:20 PM

## 2013-07-09 NOTE — Progress Notes (Signed)
Patient complaints of pain. Norco given as ordered.

## 2013-07-09 NOTE — ED Provider Notes (Signed)
CSN: [id number]     Arrival date & time 07/09/13  1118 History  This chart was scribed for Joya Gaskinsonald W Mikail Goostree, MD by Quintella ReichertMatthew Underwood, ED scribe.  This patient was seen in room APA12/APA12 and the patient's care was started at 12:00 PM.   Chief Complaint  Patient presents with  . Shortness of Breath    Patient is a 58 y.o. female presenting with shortness of breath. The history is provided by the patient. No language interpreter was used.  Shortness of Breath Onset quality:  Gradual Duration:  4 days Progression:  Worsening Chronicity:  New Worsened by:  Exertion Associated symptoms: chest pain, cough and wheezing   Associated symptoms: no abdominal pain, no fever and no vomiting     HPI Comments: Marie West is a 58 y.o. female who presents to the Emergency Department complaining of 4 days of gradually-worsening SOB with associated wheezing.  Pt states her SOB is worsened with even slight exertion.  She also complains of occasional mild cough.and some sharp chest pain on deep breathing.  She denies fever, abdominal pain, vomiting, or new leg swelling.  She is a smoker and has been trying to quit since last month.  She does not use oxygen at home.  Pt has no PCP   Past Medical History  Diagnosis Date  . MRSA infection   . Bronchitis   . Hypertension     Past Surgical History  Procedure Laterality Date  . Knee surgery    . Tubal ligation      Family History  Problem Relation Age of Onset  . Diabetes Mother   . Heart disease Mother     died AMI, age 59  . Heart disease Brother   . Diabetes Sister     History  Substance Use Topics  . Smoking status: Current Every Day Smoker -- 1.00 packs/day for 36 years    Types: Cigarettes  . Smokeless tobacco: Not on file  . Alcohol Use: Yes     Comment: occ-wine    OB History   Grav Para Term Preterm Abortions TAB SAB Ect Mult Living                  Review of Systems  Constitutional: Negative for fever.   Respiratory: Positive for cough, shortness of breath and wheezing.   Cardiovascular: Positive for chest pain.  Gastrointestinal: Negative for vomiting and abdominal pain.  All other systems reviewed and are negative.     Allergies  Bee venom  Home Medications   Current Outpatient Rx  Name  Route  Sig  Dispense  Refill  . albuterol (PROVENTIL) (2.5 MG/3ML) 0.083% nebulizer solution   Nebulization   Take 2.5 mg by nebulization every 6 (six) hours as needed. For shortness of breath         . amoxicillin (AMOXIL) 500 MG capsule   Oral   Take 1 capsule (500 mg total) by mouth 3 (three) times daily.   30 capsule   0   . Aspirin-Salicylamide-Caffeine (BC HEADACHE POWDER PO)   Oral   Take 1 packet by mouth daily as needed (pain).         . cholecalciferol (VITAMIN D) 1000 UNITS tablet   Oral   Take 1,000 Units by mouth daily.         . hydrochlorothiazide (HYDRODIURIL) 25 MG tablet   Oral   Take 1 tablet (25 mg total) by mouth daily.   30 tablet   1   .  HYDROcodone-acetaminophen (NORCO/VICODIN) 5-325 MG per tablet   Oral   Take 2 tablets by mouth every 4 (four) hours as needed for pain.   10 tablet   0   . ibuprofen (ADVIL,MOTRIN) 200 MG tablet   Oral   Take 800 mg by mouth every 4 (four) hours as needed. For pain         . naproxen (NAPROSYN) 500 MG tablet   Oral   Take 1 tablet (500 mg total) by mouth 2 (two) times daily.   30 tablet   0   . thyroid (ARMOUR THYROID) 30 MG tablet   Oral   Take 1 tablet (30 mg total) by mouth daily.   30 tablet   0   . traMADol (ULTRAM) 50 MG tablet   Oral   Take 1 tablet (50 mg total) by mouth every 6 (six) hours as needed.   15 tablet   0    BP 181/107  Pulse 70  Temp(Src) 98.1 F (36.7 C) (Oral)  Resp 24  SpO2 91%    Physical Exam  Nursing note and vitals reviewed. CONSTITUTIONAL: Well developed/well nourished HEAD: Normocephalic/atraumatic EYES: EOMI/PERRL ENMT: Mucous membranes moist NECK:  supple no meningeal signs SPINE:entire spine nontender CV: S1/S2 noted, no murmurs/rubs/gallops noted, left sided chest wall tenderness to palpation LUNGS: Wheezing bilaterally, decreased breath sounds bilaterally, mild tachypnea, no apparent distress ABDOMEN: soft, nontender, no rebound or guarding GU:no cva tenderness NEURO: Pt is awake/alert, moves all extremitiesx4 EXTREMITIES: pulses normal, full ROM SKIN: warm, color normal PSYCH: no abnormalities of mood noted   ED Course  Procedures   DIAGNOSTIC STUDIES: Oxygen Saturation is 91% on Edith Endave, low by my interpretation.    COORDINATION OF CARE: 12:05 PM-Discussed treatment plan which includes breathing treatment, EKG, CXR and labs with pt at bedside and pt agreed to plan.   2:11 PM Pt still feeling SOB, xray shows pneumonia, and was tachypneic on ambulation and hypoxic after walking I feel she should be admitted for further treatment This is community acquired pneumonia D/w dr Kerry Hough, will admit Pt stabilized in the ER  Labs Review Labs Reviewed  BASIC METABOLIC PANEL - Abnormal; Notable for the following:    CO2 34 (*)    Glucose, Bld 107 (*)    All other components within normal limits  CBC WITH DIFFERENTIAL    Imaging Review No results found.   EKG Interpretation   None       MDM  No diagnosis found. Nursing notes including past medical history and social history reviewed and considered in documentation xrays reviewed and considered Labs/vital reviewed and considered      Date: 07/09/2013  Rate: 70  Rhythm: normal sinus rhythm  QRS Axis: right  Intervals: normal  ST/T Wave abnormalities: normal  Conduction Disutrbances:none     I personally performed the services described in this documentation, which was scribed in my presence. The recorded information has been reviewed and is accurate.      Joya Gaskins, MD 07/09/13 [phone]

## 2013-07-09 NOTE — H&P (Signed)
Patient seen and examined. Agree with note as above.  This is a 58 year old morbidly obese lady who does not seek regular medical care, and has been smoking for the last 20 years. She recently quit in December 2014. Patient presents to the hospital with complaints of cough and shortness of breath. She is found to have a right-sided pneumonia. She's been started on antibiotics for community acquired pneumonia. It appears she does have a small parapneumonic effusion. This will need follow up with chest x-ray to ensure resolution. It does appear that she does have some significant COPD as well. Her outpatient treatment has been limited due to lack of insurance. We will start the patient on nebulizer treatments as well as steroids at this time which can be quickly tapered as her condition improves. She will likely need sleep studies an outpatient once her insurance issues have resolved, since her body habitus  predisposes her to obstructive sleep apnea and obesity hypoventilation.  MEMON,JEHANZEB

## 2013-07-09 NOTE — ED Notes (Signed)
Pt ambulatory to bathroom and became very sob. sats ra after 84%. Pt placed back on 02 2l . EDP aware.

## 2013-07-09 NOTE — Progress Notes (Signed)
Patient lying in bed resting quietly at this time. No complaints voiced.

## 2013-07-10 LAB — CBC
HCT: 42.7 % (ref 36.0–46.0)
Hemoglobin: 13.4 g/dL (ref 12.0–15.0)
MCH: 29.1 pg (ref 26.0–34.0)
MCHC: 31.4 g/dL (ref 30.0–36.0)
MCV: 92.8 fL (ref 78.0–100.0)
Platelets: 273 10*3/uL (ref 150–400)
RBC: 4.6 MIL/uL (ref 3.87–5.11)
RDW: 15.2 % (ref 11.5–15.5)
WBC: 8.6 10*3/uL (ref 4.0–10.5)

## 2013-07-10 LAB — LEGIONELLA ANTIGEN, URINE: Legionella Antigen, Urine: NEGATIVE

## 2013-07-10 LAB — STREP PNEUMONIAE URINARY ANTIGEN: Strep Pneumo Urinary Antigen: NEGATIVE

## 2013-07-10 LAB — BASIC METABOLIC PANEL
BUN: 12 mg/dL (ref 6–23)
CO2: 34 mEq/L — ABNORMAL HIGH (ref 19–32)
Calcium: 9.4 mg/dL (ref 8.4–10.5)
Chloride: 99 mEq/L (ref 96–112)
Creatinine, Ser: 0.57 mg/dL (ref 0.50–1.10)
GFR calc Af Amer: >90 mL/min (ref >90)
GFR calc non Af Amer: >90 mL/min (ref >90)
Glucose, Bld: 142 mg/dL — ABNORMAL HIGH (ref 70–99)
Potassium: 4.6 mEq/L (ref 3.7–5.3)
Sodium: 142 mEq/L (ref 137–147)

## 2013-07-10 MED ORDER — IPRATROPIUM-ALBUTEROL 0.5-2.5 (3) MG/3ML IN SOLN
3.0000 mL | Freq: Four times a day (QID) | RESPIRATORY_TRACT | Status: DC
  Administered 2013-07-10 – 2013-07-11 (×2): 3 mL via RESPIRATORY_TRACT
  Filled 2013-07-10 (×2): qty 3

## 2013-07-10 MED ORDER — IBUPROFEN 400 MG PO TABS
200.0000 mg | ORAL_TABLET | ORAL | Status: DC | PRN
  Administered 2013-07-10 – 2013-07-11 (×2): 200 mg via ORAL
  Filled 2013-07-10 (×2): qty 1

## 2013-07-10 MED ORDER — PREDNISONE 20 MG PO TABS
40.0000 mg | ORAL_TABLET | Freq: Every day | ORAL | Status: DC
  Administered 2013-07-11: 40 mg via ORAL
  Filled 2013-07-10: qty 2

## 2013-07-10 MED ORDER — IPRATROPIUM-ALBUTEROL 0.5-2.5 (3) MG/3ML IN SOLN
3.0000 mL | Freq: Four times a day (QID) | RESPIRATORY_TRACT | Status: DC
Start: 2013-07-10 — End: 2013-07-10

## 2013-07-10 NOTE — Plan of Care (Signed)
Problem: Food- and Nutrition-Related Knowledge Deficit (NB-1.1) Goal: Nutrition education Formal process to instruct or train a patient/client in a skill or to impart knowledge to help patients/clients voluntarily manage or modify food choices and eating behavior to maintain or improve health. Outcome: Adequate for Discharge  Nutrition Education Note  RD consulted for nutrition education regarding a Heart Healthy diet and weight loss.   Lipid Panel     Component Value Date/Time    CHOL 193 06/02/2012 0621    TRIG 112 06/02/2012 0621    HDL 66 06/02/2012 0621    CHOLHDL 2.9 06/02/2012 0621    VLDL 22 06/02/2012 0621    LDLCALC 105* 06/02/2012 0621    RD provided "Heart Healthy Nutrition Therapy" handout from the Academy of [employer]etics and also  "Tips for Weight Loss". Reviewed pt diet recall. Provided examples on ways to decrease sodium and fat intake in diet. Discouraged intake of processed foods and use of salt shaker. Encouraged fresh fruits and vegetables as well as whole grain sources of carbohydrates to maximize fiber intake. Teach back method used. Expect good compliance. Pt says she is ready to make changes in her diet and lifestyle to help improve her overall health.  Body mass index is 59.74 kg/(m^2). Pt meets criteria for extreme obesity class III based on current BMI.  Current diet order is Heart Healthy, patient is consuming approximately 75-100% of meals at this time. Labs and medications reviewed. RD contact information provided.   Marie ShiversLynn Jaquelynn Wanamaker MS,RD,CSG,LDN Office: [phone] Pager: [phone]

## 2013-07-10 NOTE — Progress Notes (Signed)
Utilization Review Complete  

## 2013-07-10 NOTE — Progress Notes (Signed)
Patient seen, independently examined and chart reviewed. I agree with exam, assessment and plan discussed with Toya SmothersKaren Black, NP.  58 year old woman with ongoing smoking presented with shortness of breath, found to have acute hypoxic respiratory failure. Admitted for pneumonia, COPD exacerbation.  Overall she feels better today. Breathing better.   Afebrile vital signs stable. Stable hypoxia. Remains hypoxic on murmur. Appears calm and comfortable. Sitting in chair. Speech fluent and clear. Lungs with few wheezes and coarse breath sounds. Mild increased respiratory effort. Heart sounds regular. She appears nontoxic.  Basic metabolic panel unremarkable. CBC stable. Influenza negative.  Acute hypoxic respiratory failure Community acquired pneumonia Hypertension Pleural effusion Possible COPD Morbid obesity  She appears to be improving clinically with treatment of the above. Change to oral steroids in the morning, wean oxygen as tolerated, change to oral antibiotics in the morning. She will need close outpatient followup, $4 medications if possible especially for chronic medications. Outpatient chest x-ray to followup resolution.  Brendia Sacksaniel Savio Albrecht, MD [facility] [phone]

## 2013-07-10 NOTE — Progress Notes (Addendum)
Patients stats on RA at rest - 85% On 2L O2 ambulating - 85% On 3L O2 ambulating - 90% At rest with 3L O2 - 94%  Patient ambulated length of 2 hallways.  Did c/o of slight SOB after about 50 feet, but tolerated well with O2

## 2013-07-10 NOTE — Progress Notes (Signed)
[facility] PROGRESS NOTE  Marie West ZOX:[id number] DOB: 1956-03-22 DOA: 07/09/2013 PCP: No PCP Per Patient  Assessment/Plan: Acute respiratory failure: Likely related to COPD exacerbation as well as right pleural effusion and possible pneumonia. Oxygen saturation levels 93% on 3L. On admission patient oxygen saturation level 95% on 2 L. Will continue with nebulizers and Solu-Medrol. Plan to transition to prednisone tomorrow.  Continue oxygen supplementation and monitor closely.  Active Problems:  CAP (community acquired pneumonia): Will continue the Rocephin and azithromycin day #2. She remains afebrile and non-toxic appearing.  Will get blood cultures if she spikes a temperature > 101. Strep pneumo, Legionella  Antigen and influenza panel all negative.    Hypertensive urgency: Has not taken meds in several weeks due to finances and no PCP.  Better controlled today. Continue amlodipine and lisinopril. Has not needed the PRN hydralazine.  Will request case management to get her set up with the primary care provider   COPD with exacerbation: See above. Oxygen saturation levels improved after nebulizer treatments and with oxygen supplementation continue nebs and Solu-Medrol. Continue oxygen supplementation. Antibiotics as above.   Pleural effusion: The above therapies. Reviewed by Dr. Kerry HoughMemon. Does not appear to require draining at this time. Will need follow up chest xray to ensure resolution.    Morbid obesity: check BMI. Request nutritional consult. May need OP sleep study for obstructive sleep apnea  Arthritis: appears stable at baseline.    Code Status: full Family Communication: none present Disposition Plan: home hopefully 24-48 hours   Consultants:  none  Procedures:  none  Antibiotics:  Rocephin 07/09/13 >>  zithromax 07/09/13>>  HPI/Subjective: Sitting on side of bed. Reports feeling better.  Objective: Filed Vitals:   07/10/13 0514  BP: 164/82  Pulse:  69  Temp: 97.5 F (36.4 C)  Resp: 20    Intake/Output Summary (Last 24 hours) at 07/10/13 1219 Last data filed at 07/10/13 1140  Gross per 24 hour  Intake    540 ml  Output    553 ml  Net    -13 ml   Filed Weights   07/09/13 1557 07/10/13 0514  Weight: 146.875 kg (323 lb 12.8 oz) 148.2 kg (326 lb 11.6 oz)    Exam:   General:  Obese NAD  Cardiovascular: RRR No m/g/r trace LE edema   Respiratory: normal effort BS with improved air flow and diffuse expiratory wheeze   Abdomen: obese soft +BS non-tender to palpation  Musculoskeletal: no clubbing or cyanosis   Data Reviewed: Basic Metabolic Panel:  Recent Labs Lab 07/09/13 1217 07/10/13 0535  NA 142 142  K 4.8 4.6  CL 100 99  CO2 34* 34*  GLUCOSE 107* 142*  BUN 12 12  CREATININE 0.58 0.57  CALCIUM 9.1 9.4   Liver Function Tests: No results found for this basename: AST, ALT, ALKPHOS, BILITOT, PROT, ALBUMIN,  in the last 168 hours No results found for this basename: LIPASE, AMYLASE,  in the last 168 hours No results found for this basename: AMMONIA,  in the last 168 hours CBC:  Recent Labs Lab 07/09/13 1217 07/10/13 0535  WBC 7.1 8.6  NEUTROABS 5.0  --   HGB 13.6 13.4  HCT 43.5 42.7  MCV 92.6 92.8  PLT 236 273   Cardiac Enzymes:  Recent Labs Lab 07/09/13 1557  TROPONINI <0.30   BNP (last 3 results) No results found for this basename: PROBNP,  in the last 8760 hours CBG: No results found for this basename: GLUCAP,  in the last 168 hours  No results found for this or any previous visit (from the past 240 hour(s)).   Studies: Dg Chest 2 View  07/09/2013   CLINICAL DATA:  Shortness of breath and chest pain  EXAM: CHEST  2 VIEW  COMPARISON:  05/06/2013  FINDINGS: Moderately enlarged cardiopericardial silhouette appears similar to radiographs from November 2014. There is pulmonary vascular congestion. There is airspace disease at the right lung base, which partially obscures the lateral right  hemidiaphragm. Hazy opacity is seen posteriorly on the lateral view, favored to be due to a right pleural effusion. There is peribronchial thickening at the left lung base, but no discrete left basilar opacity is seen.  IMPRESSION: Findings suspicious for right basilar airspace disease and associated right pleural effusion. Pneumonia with parapneumonic effusion cannot be excluded. Suggest clinical correlation. Image quality is degraded by patient body habitus.  Stable cardiomegaly.   Electronically Signed   By: Britta Mccreedy M.D.   On: 07/09/2013 13:34    Scheduled Meds: . amLODipine  5 mg Oral Daily  . azithromycin  500 mg Intravenous Q24H  . cefTRIAXone (ROCEPHIN)  IV  2 g Intravenous Q24H  . enoxaparin (LOVENOX) injection  70 mg Subcutaneous Q24H  . guaiFENesin  1,200 mg Oral BID  . ipratropium-albuterol  3 mL Nebulization Q4H  . lisinopril  5 mg Oral Daily  . methylPREDNISolone (SOLU-MEDROL) injection  60 mg Intravenous Q12H  . sodium chloride  3 mL Intravenous Q12H  . thyroid  30 mg Oral Daily   Continuous Infusions:   Principal Problem:   Acute respiratory failure Active Problems:   COPD with exacerbation   Morbid obesity   Arthritis   CAP (community acquired pneumonia)   Pleural effusion   Hypertensive urgency   Acute respiratory failure with hypoxia    Time spent: 35 minutes    [facility] M  [facility] Pager [phone]. If 7PM-7AM, please contact night-coverage at [email or web address], password [facility] 07/10/2013, 12:19 PM  LOS: 1 day

## 2013-07-11 MED ORDER — AMLODIPINE BESYLATE 5 MG PO TABS: 5.0000 mg | ORAL_TABLET | Freq: Every day | ORAL | Status: DC

## 2013-07-11 MED ORDER — BUDESONIDE-FORMOTEROL FUMARATE 160-4.5 MCG/ACT IN AERO: 2.0000 | INHALATION_SPRAY | Freq: Two times a day (BID) | RESPIRATORY_TRACT | Status: DC

## 2013-07-11 MED ORDER — CEFUROXIME AXETIL 500 MG PO TABS: ORAL_TABLET | ORAL | Status: DC

## 2013-07-11 MED ORDER — AZITHROMYCIN 250 MG PO TABS: ORAL_TABLET | ORAL | Status: AC

## 2013-07-11 MED ORDER — ALBUTEROL SULFATE HFA 108 (90 BASE) MCG/ACT IN AERS: 2.0000 | INHALATION_SPRAY | Freq: Four times a day (QID) | RESPIRATORY_TRACT | Status: DC | PRN

## 2013-07-11 MED ORDER — LISINOPRIL 5 MG PO TABS: 5.0000 mg | ORAL_TABLET | Freq: Every day | ORAL | Status: DC

## 2013-07-11 MED ORDER — THYROID 30 MG PO TABS: 30.0000 mg | ORAL_TABLET | Freq: Every day | ORAL | Status: DC

## 2013-07-11 MED ORDER — PREDNISONE 10 MG PO TABS: ORAL_TABLET | ORAL | Status: DC

## 2013-07-11 MED ORDER — BUDESONIDE-FORMOTEROL FUMARATE 160-4.5 MCG/ACT IN AERO
2.0000 | INHALATION_SPRAY | Freq: Two times a day (BID) | RESPIRATORY_TRACT | Status: DC
  Filled 2013-07-11: qty 6

## 2013-07-11 MED ORDER — ALBUTEROL SULFATE HFA 108 (90 BASE) MCG/ACT IN AERS: 2.0000 | INHALATION_SPRAY | RESPIRATORY_TRACT | Status: DC | PRN

## 2013-07-11 MED ORDER — CEFUROXIME AXETIL 250 MG PO TABS: ORAL_TABLET | ORAL | Status: DC

## 2013-07-11 MED ORDER — CEFUROXIME AXETIL 250 MG PO TABS: 250.0000 mg | ORAL_TABLET | Freq: Two times a day (BID) | ORAL | Status: DC

## 2013-07-11 MED ORDER — AZITHROMYCIN 250 MG PO TABS: ORAL_TABLET | ORAL | Status: DC

## 2013-07-11 MED ORDER — AZITHROMYCIN 250 MG PO TABS
500.0000 mg | ORAL_TABLET | Freq: Every day | ORAL | Status: DC
  Administered 2013-07-11: 500 mg via ORAL
  Filled 2013-07-11: qty 2

## 2013-07-11 MED ORDER — HYDROCODONE-ACETAMINOPHEN 5-325 MG PO TABS: 1.0000 | ORAL_TABLET | ORAL | Status: DC | PRN

## 2013-07-11 MED ORDER — CEFUROXIME AXETIL 500 MG PO TABS: ORAL_TABLET | ORAL | Status: AC

## 2013-07-11 NOTE — Care Management Note (Signed)
    Page 1 of 1   07/11/2013     11:41:36 AM   CARE MANAGEMENT NOTE 07/11/2013  Patient:  Marie West,Marie West   Account Number:  [id number]  Date Initiated:  07/11/2013  Documentation initiated by:  Rosemary HolmsOBSON,[employer]  Subjective/Objective Assessment:   Pt from home and will return. Pt spoke to [employer] Sciences CorporationBetty Ratliff regarding M'ca[employer] and Disability. Works part-time but may still qualify for [employer].     Action/Plan:   Anticipated DC Date:  07/11/2013   Anticipated DC Plan:  HOME/SELF CARE  In-house referral  Financial Counselor      DC Planning Services  CM consult  MATCH Program      Choice offered to / List presented to:     DME arranged  [facility]      DME agency  [facility]        Status of service:  Completed, signed off Medicare Important Message given?   (If response is "NO", the following Medicare IM given date fields will be blank) Date Medicare IM given:   Date Additional Medicare IM given:    Discharge Disposition:  HOME/SELF CARE  Per UR Regulation:    If discussed at Long Length of Stay Meetings, dates discussed:    Comments:  07/11/13 11:40 [place] Nie Leanord Hawkingobson RN BSN CM

## 2013-07-11 NOTE — Discharge Summary (Signed)
Physician Discharge Summary  Marie West ZOX:[id number] DOB: [birth year] DOA: 07/09/2013  PCP: No PCP Per Patient  Admit date: 07/09/2013 Discharge date: 07/11/2013  Time spent: 45 minutes  Recommendations for Outpatient Follow-up:  [facility]. Recommend following respiratory status, BP control. Will also need follow up chest xray 4-6 weeks to ensure resolution of pleural effusion 2. Being discharged on home oxygen Discharge Diagnoses:  Principal Problem:   Acute respiratory failure Active Problems:   COPD with exacerbation   Morbid obesity   Arthritis   CAP (community acquired pneumonia)   Pleural effusion   Hypertensive urgency   Acute respiratory failure with hypoxia   Discharge Condition: stable  Diet recommendation:  Heart healthy  Filed Weights   07/09/13 1557 07/10/13 0514 07/11/13 0500  Weight: 146.875 kg (323 lb 12.8 oz) 148.2 kg (326 lb 11.6 oz) 153.543 kg (338 lb 8 oz)    History of present illness:  Marie West is a very pleasant 58 y.o. female with a past medical history that includes bronchitis, hypertension, presented to the emergency room 07/09/13 with a chief complaint of a four-day history gradually worsening shortness of breath and associated wheezing.  She reported that she gradually developed shortness of breath about 4 days prior and decided to come to the emergency room when she could not walk from the bedroom to the bathroom without becoming short of breath. Is not on home oxygen.  Associated symptoms included occasional mild nonproductive cough, headache and chest pain particularly with coughing. She denied any fever chills abdominal pain nausea vomiting diarrhea. She denied any worsening lower extremity edema. She stated that she typically sleeps propped up on 2 pillows. She is a former smoker is been trying to quit for 2 months. She also reported not having her anti hypertensive meds for several weeks.  In the emergency room she was  found to become hypoxic at 84% with ambulation to the bathroom, chest x-ray yields right basilar airspace disease and associated right pleural effusion with concern for pneumonia. Vital signs were significant for blood pressure 186/111. In the ED  she was given 60 mg of prednisone, azithromycin and Rocephin intravenously, oxygen supplementation and nebulizers. At the time of my exam she was feeling much better.   Hospital Course:  Acute respiratory failure: related to COPD exacerbation as well as right pleural effusion and possible pneumonia. Admitted and provided with nebs, solumedrol and antibiotics. She improved somewhat but remains hypoxic on room air at discharge. Oxygen saturation levels 93% on 3L. She will be discharged with prednisone taper, antibiotics to complete 10 day course and oxygen. She has follow up appointment 07/17/13 for evaluation of respiratory status.   Active Problems:  CAP (community acquired pneumonia): Provided with  Rocephin and azithromycin. Will be discharged with zithromax to total 5 days and ceftin total 10 days. She remained afebrile and non-toxic appearing during this hospitalization. Strep pneumo, Legionella Antigen and influenza panel all negative.   Hypertensive urgency: Had not taken meds in several weeks due to finances and no PCP. Started on norvasc and lisinopril. Controlled at discharge. Will need close OP follow up    COPD with exacerbation: See #1 above. Being discharged on oxygen and with symbicort. Recommend close OP follow up for evaluation of respiratory status. sats at rest room air 85%.   Pleural effusion: The above therapies. Does not appear to require draining at this time. Will need follow up chest xray in 4-6 weeks to ensure resolution.  Morbid obesity: check BMI. Request nutritional consult. May need OP sleep study for obstructive sleep apnea   Arthritis: appears stable at baseline.    Procedures:  none  Consultations:  none  Discharge  Exam: Filed Vitals:   07/11/13 0500  BP: 150/83  Pulse: 66  Temp: 97.8 F (36.6 C)  Resp: 18    General: obese NAD Cardiovascular: RRR No MGR No LE edema Respiratory: normal effort BS coarse but improved air flow. Faint wheeze  Discharge Instructions      Discharge Orders   Future Orders Complete By Expires   Diet - low sodium heart healthy  As directed    Discharge instructions  As directed    Comments:     Take medications as directed Follow up with PCP as instructed and scheduled Stop smoking   Increase activity slowly  As directed        Medication List    STOP taking these medications       hydrochlorothiazide 25 MG tablet  Commonly known as:  HYDRODIURIL      TAKE these medications       albuterol (2.5 MG/3ML) 0.083% nebulizer solution  Commonly known as:  PROVENTIL  Take 2.5 mg by nebulization every 6 (six) hours as needed. For shortness of breath     albuterol 108 (90 BASE) MCG/ACT inhaler  Commonly known as:  PROVENTIL HFA;VENTOLIN HFA  Inhale 2 puffs into the lungs every 6 (six) hours as needed for wheezing or shortness of breath.     amLODipine 5 MG tablet  Commonly known as:  NORVASC  Take 1 tablet (5 mg total) by mouth daily.     azithromycin 250 MG tablet  Commonly known as:  ZITHROMAX  Take 2 tabs daily for 4 days.     BC HEADACHE POWDER PO  Take 1 packet by mouth daily as needed (pain).     budesonide-formoterol 160-4.5 MCG/ACT inhaler  Commonly known as:  SYMBICORT  Inhale 2 puffs into the lungs 2 (two) times daily.     cefUROXime 500 MG tablet  Commonly known as:  CEFTIN  Take 1 tab twice daily for 9 days     cholecalciferol 1000 UNITS tablet  Commonly known as:  VITAMIN D  Take 1,000 Units by mouth daily.     HYDROcodone-acetaminophen 5-325 MG per tablet  Commonly known as:  NORCO/VICODIN  Take 1-2 tablets by mouth every 4 (four) hours as needed for moderate pain.     ibuprofen 200 MG tablet  Commonly known as:   ADVIL,MOTRIN  Take 800 mg by mouth every 4 (four) hours as needed. For pain     lisinopril 5 MG tablet  Commonly known as:  PRINIVIL,ZESTRIL  Take 1 tablet (5 mg total) by mouth daily.     predniSONE 10 MG tablet  Commonly known as:  DELTASONE  Take 4 tabs 1/23 take 3 tabs 1/24 and 1/25 take 2 tabs 1/26 and 1/27 1 tab 1/28 and 1/29 then stop.     thyroid 30 MG tablet  Commonly known as:  ARMOUR THYROID  Take 1 tablet (30 mg total) by mouth daily.       Allergies  Allergen Reactions  . Bee Venom Swelling   Follow-up Information   [facility] On 07/17/2013. (at 1:30, bring financial information from handout)    Contact information:   [facility]  [address] [place], [place]  [phone]      Please  follow up. (follow up on respiratory status, BP and schedule repeat chest xray 4-6 weeks)        The results of significant diagnostics from this hospitalization (including imaging, microbiology, ancillary and laboratory) are listed below for reference.    Significant Diagnostic Studies: Dg Chest 2 View  07/09/2013   CLINICAL DATA:  Shortness of breath and chest pain  EXAM: CHEST  2 VIEW  COMPARISON:  05/06/2013  FINDINGS: Moderately enlarged cardiopericardial silhouette appears similar to radiographs from November 2014. There is pulmonary vascular congestion. There is airspace disease at the right lung base, which partially obscures the lateral right hemidiaphragm. Hazy opacity is seen posteriorly on the lateral view, favored to be due to a right pleural effusion. There is peribronchial thickening at the left lung base, but no discrete left basilar opacity is seen.  IMPRESSION: Findings suspicious for right basilar airspace disease and associated right pleural effusion. Pneumonia with parapneumonic effusion cannot be excluded. Suggest clinical correlation. Image quality is degraded by patient body habitus.  Stable cardiomegaly.   Electronically Signed   By:  Britta Mccreedy M.D.   On: 07/09/2013 13:34    Microbiology: No results found for this or any previous visit (from the past 240 hour(s)).   Labs: Basic Metabolic Panel:  Recent Labs Lab 07/09/13 1217 07/10/13 0535  NA 142 142  K 4.8 4.6  CL 100 99  CO2 34* 34*  GLUCOSE 107* 142*  BUN 12 12  CREATININE 0.58 0.57  CALCIUM 9.1 9.4   Liver Function Tests: No results found for this basename: AST, ALT, ALKPHOS, BILITOT, PROT, ALBUMIN,  in the last 168 hours No results found for this basename: LIPASE, AMYLASE,  in the last 168 hours No results found for this basename: AMMONIA,  in the last 168 hours CBC:  Recent Labs Lab 07/09/13 1217 07/10/13 0535  WBC 7.1 8.6  NEUTROABS 5.0  --   HGB 13.6 13.4  HCT 43.5 42.7  MCV 92.6 92.8  PLT 236 273   Cardiac Enzymes:  Recent Labs Lab 07/09/13 1557  TROPONINI <0.30   BNP: BNP (last 3 results) No results found for this basename: PROBNP,  in the last 8760 hours CBG: No results found for this basename: GLUCAP,  in the last 168 hours     Signed:  Gwenyth Bender  [facility] 07/11/2013, 2:16 PM

## 2013-07-11 NOTE — Progress Notes (Signed)
Patient discharged home.  IV removed - WNL.  Instructed on new medications and how to take them, as well as O2 use. Educated on importance of completing dose of antibiotics to prevent further PNA.  Pt demonstrates understanding of how to use O2.  [facility]  No questions at this time.  Stable to DC home.  Awaiting arrival of ride home.  Prescriptions faxed to [facility] in [place] so that patient can use voucher provided by CM to help purchase meds.

## 2013-07-11 NOTE — Discharge Summary (Signed)
Patient seen, independently examined and chart reviewed. I agree with exam, assessment and plan discussed with Toya SmothersKaren Black, NP.  Feels much better. Breathing better. Wants to go home.  She appears calm and comfortable. Afebrile, stable vital signs. Hypoxia stable. Lungs with fair air movement. Normal respiratory effort.  She is improved rapidly with treatment for acute hypoxic respiratory failure, community acquired pneumonia. She will need home oxygen but can be discharged home today to complete antibiotics as an outpatient. Recommend repeat chest x-ray in 4-6 weeks to followup on pleural effusion.  Brendia Sacksaniel Goodrich, MD [facility] [phone]

## 2013-08-30 ENCOUNTER — Other Ambulatory Visit (HOSPITAL_COMMUNITY): Payer: Self-pay | Admitting: Family Medicine

## 2013-08-30 DIAGNOSIS — Z1231 Encounter for screening mammogram for malignant neoplasm of breast: Secondary | ICD-10-CM

## 2013-09-13 ENCOUNTER — Ambulatory Visit (HOSPITAL_COMMUNITY): Payer: Self-pay

## 2013-09-17 ENCOUNTER — Ambulatory Visit (HOSPITAL_COMMUNITY)
Admission: RE | Admit: 2013-09-17 | Discharge: 2013-09-17 | Disposition: A | Discharge status: Home / Self Care | Payer: Self-pay | Source: Ambulatory Visit | Attending: Family Medicine | Admitting: Family Medicine

## 2013-09-17 DIAGNOSIS — Z1231 Encounter for screening mammogram for malignant neoplasm of breast: Secondary | ICD-10-CM

## 2013-11-07 ENCOUNTER — Encounter: Payer: Self-pay | Admitting: Gastroenterology

## 2013-12-10 ENCOUNTER — Encounter: Payer: Self-pay | Admitting: Gastroenterology

## 2013-12-10 ENCOUNTER — Telehealth (HOSPITAL_COMMUNITY): Payer: Self-pay | Admitting: Dietician

## 2013-12-10 ENCOUNTER — Ambulatory Visit: Payer: Self-pay | Admitting: Gastroenterology

## 2013-12-10 ENCOUNTER — Telehealth: Payer: Self-pay | Admitting: Gastroenterology

## 2013-12-10 NOTE — Telephone Encounter (Signed)
Pt was a no show

## 2013-12-10 NOTE — Telephone Encounter (Signed)
Pt was a no-show for appointment scheduled for 12/10/2013 at 0900.

## 2013-12-10 NOTE — Telephone Encounter (Signed)
Please make PCP aware. 

## 2013-12-10 NOTE — Telephone Encounter (Signed)
Pt's initial appointment was scheduled for 11/22/13 at 0900. Spoke with pt on 11/21/13 at 1630 and she reported she was unaware of the appointment and was unable to come due to her work schedule. Offered to reschedule appointment to 12/10/13 at 0900 and pt agreeable.

## 2013-12-11 NOTE — Telephone Encounter (Signed)
PCP is aware and letter mailed to patient

## 2013-12-19 ENCOUNTER — Emergency Department (HOSPITAL_COMMUNITY): Payer: Self-pay

## 2013-12-19 ENCOUNTER — Encounter (HOSPITAL_COMMUNITY): Payer: Self-pay | Admitting: Emergency Medicine

## 2013-12-19 ENCOUNTER — Emergency Department (HOSPITAL_COMMUNITY)
Admission: EM | Admit: 2013-12-19 | Discharge: 2013-12-19 | Disposition: A | Discharge status: Home / Self Care | Payer: Self-pay | Attending: Emergency Medicine | Admitting: Emergency Medicine

## 2013-12-19 DIAGNOSIS — Z8669 Personal history of other diseases of the nervous system and sense organs: Secondary | ICD-10-CM | POA: Insufficient documentation

## 2013-12-19 DIAGNOSIS — Z8614 Personal history of Methicillin resistant Staphylococcus aureus infection: Secondary | ICD-10-CM | POA: Insufficient documentation

## 2013-12-19 DIAGNOSIS — J449 Chronic obstructive pulmonary disease, unspecified: Secondary | ICD-10-CM | POA: Insufficient documentation

## 2013-12-19 DIAGNOSIS — I10 Essential (primary) hypertension: Secondary | ICD-10-CM | POA: Insufficient documentation

## 2013-12-19 DIAGNOSIS — I509 Heart failure, unspecified: Secondary | ICD-10-CM | POA: Insufficient documentation

## 2013-12-19 DIAGNOSIS — IMO0002 Reserved for concepts with insufficient information to code with codable children: Secondary | ICD-10-CM | POA: Insufficient documentation

## 2013-12-19 DIAGNOSIS — E0590 Thyrotoxicosis, unspecified without thyrotoxic crisis or storm: Secondary | ICD-10-CM | POA: Insufficient documentation

## 2013-12-19 DIAGNOSIS — J4489 Other specified chronic obstructive pulmonary disease: Secondary | ICD-10-CM | POA: Insufficient documentation

## 2013-12-19 DIAGNOSIS — Z8701 Personal history of pneumonia (recurrent): Secondary | ICD-10-CM | POA: Insufficient documentation

## 2013-12-19 DIAGNOSIS — Z87891 Personal history of nicotine dependence: Secondary | ICD-10-CM | POA: Insufficient documentation

## 2013-12-19 DIAGNOSIS — Z79899 Other long term (current) drug therapy: Secondary | ICD-10-CM | POA: Insufficient documentation

## 2013-12-19 DIAGNOSIS — M129 Arthropathy, unspecified: Secondary | ICD-10-CM | POA: Insufficient documentation

## 2013-12-19 DIAGNOSIS — M5416 Radiculopathy, lumbar region: Secondary | ICD-10-CM

## 2013-12-19 MED ORDER — IBUPROFEN 600 MG PO TABS: 600.0000 mg | ORAL_TABLET | Freq: Four times a day (QID) | ORAL | Status: DC | PRN

## 2013-12-19 MED ORDER — HYDROCODONE-ACETAMINOPHEN 5-325 MG PO TABS: 1.0000 | ORAL_TABLET | ORAL | Status: DC | PRN

## 2013-12-19 MED ORDER — HYDROMORPHONE HCL PF 1 MG/ML IJ SOLN
INTRAMUSCULAR | Status: AC
  Filled 2013-12-19: qty 1

## 2013-12-19 MED ORDER — HYDROMORPHONE HCL PF 1 MG/ML IJ SOLN
1.0000 mg | Freq: Once | INTRAMUSCULAR | Status: AC
  Administered 2013-12-19: 1 mg via INTRAMUSCULAR

## 2013-12-19 MED ORDER — KETOROLAC TROMETHAMINE 60 MG/2ML IM SOLN
60.0000 mg | Freq: Once | INTRAMUSCULAR | Status: AC
  Administered 2013-12-19: 60 mg via INTRAMUSCULAR

## 2013-12-19 MED ORDER — KETOROLAC TROMETHAMINE 60 MG/2ML IM SOLN
INTRAMUSCULAR | Status: AC
  Filled 2013-12-19: qty 2

## 2013-12-19 MED ORDER — HYDROMORPHONE HCL PF 1 MG/ML IJ SOLN
1.0000 mg | Freq: Once | INTRAMUSCULAR | Status: AC
  Administered 2013-12-19: 1 mg via INTRAMUSCULAR
  Filled 2013-12-19: qty 1

## 2013-12-19 MED ORDER — METHOCARBAMOL 500 MG PO TABS: 1000.0000 mg | ORAL_TABLET | Freq: Four times a day (QID) | ORAL | Status: AC

## 2013-12-19 NOTE — ED Provider Notes (Signed)
CSN: [id number]     Arrival date & time 12/19/13  1846 History   First MD Initiated Contact with Patient 12/19/13 1856     Chief Complaint  Patient presents with  . Back Pain     (Consider location/radiation/quality/duration/timing/severity/associated sxs/prior Treatment) The history is provided by the patient.     Kathaleen Masermanda C Kilmartin is a 58 y.o. female  presenting with acute on chronic low back pain which has which has been present for the past  2 days.   Patient denies any new injury specifically.  She reports having intermittent pain when she "overdoes it", but rest and tylneol or ibuprofen generally relieves her symptoms.  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   There has been no weakness or numbness in the lower extremities and no urinary or bowel retention or incontinence.  Patient does not have a history of cancer or IVDU.  The patient has tried ibuprofen today without relief of pain.   Past Medical History  Diagnosis Date  . MRSA infection   . Bronchitis   . Hypertension   . Hypertensive urgency   . Acute respiratory failure   . COPD with exacerbation   . Retinal detachment   . Hyperthyroidism   . Shortness of breath   . Pneumonia   . CHF (congestive heart failure)   . Headache(784.0)   . Fibromyalgia   . Arthritis    Past Surgical History  Procedure Laterality Date  . Knee surgery    . Tubal ligation     Family History  Problem Relation Age of Onset  . Diabetes Mother   . Heart disease Mother     died AMI, age 59  . Heart disease Brother   . Diabetes Sister    History  Substance Use Topics  . Smoking status: Former Smoker -- 1.00 packs/day for 36 years    Types: Cigarettes    Quit date: 06/03/2013  . Smokeless tobacco: Not on file  . Alcohol Use: Yes     Comment: occ-wine    OB History   Grav Para Term Preterm Abortions TAB SAB Ect Mult Living                 Review of Systems  Constitutional: Negative for fever.  Respiratory: Negative for shortness of breath.   Cardiovascular: Negative for chest pain and leg swelling.  Gastrointestinal: Negative for abdominal pain, constipation and abdominal distention.  Genitourinary: Negative for dysuria, urgency, frequency, flank pain and difficulty urinating.  Musculoskeletal: Positive for back pain. Negative for gait problem and joint swelling.  Skin: Negative for rash.  Neurological: Negative for weakness and numbness.      Allergies  Bee venom  Home Medications   Prior to Admission medications   Medication Sig Start Date End Date Taking? Authorizing Provider  albuterol (PROVENTIL HFA;VENTOLIN HFA) 108 (90 BASE) MCG/ACT inhaler Inhale 2 puffs into the lungs every 6 (six) hours as needed for wheezing or shortness of breath. 07/11/13  Yes Lesle ChrisKaren M Black, NP  amLODipine (NORVASC) 5 MG tablet Take 1 tablet (5 mg total) by mouth daily. 07/11/13  Yes Lesle ChrisKaren M Black, NP  budesonide-formoterol (SYMBICORT) 160-4.5 MCG/ACT inhaler Inhale 2 puffs into the lungs 2 (two) times daily. 07/11/13  Yes Gwenyth BenderKaren M Black, NP  Calcium-Magnesium-Zinc (  CAL-MAG-ZINC PO) Take 1 capsule by mouth daily.   Yes Historical Provider, MD  cholecalciferol (VITAMIN D) 1000 UNITS tablet Take 1,000 Units by mouth daily.   Yes Historical Provider, MD  fluticasone (FLONASE) 50 MCG/ACT nasal spray Place 1 spray into both nostrils daily.   Yes Historical Provider, MD  ibuprofen (ADVIL,MOTRIN) 200 MG tablet Take 800 mg by mouth every 4 (four) hours as needed. For pain   Yes Historical Provider, MD  lisinopril (PRINIVIL,ZESTRIL) 10 MG tablet Take 10 mg by mouth daily.   Yes Historical Provider, MD  metFORMIN (GLUCOPHAGE) 500 MG tablet Take 500 mg by mouth at bedtime.   Yes Historical Provider, MD  pravastatin (PRAVACHOL) 20 MG tablet Take 20 mg by mouth  daily.   Yes Historical Provider, MD  thyroid (ARMOUR THYROID) 30 MG tablet Take 1 tablet (30 mg total) by mouth daily. 07/11/13  Yes Lesle Chris Black, NP  albuterol (PROVENTIL) (2.5 MG/3ML) 0.083% nebulizer solution Take 2.5 mg by nebulization every 6 (six) hours as needed. For shortness of breath    Historical Provider, MD  HYDROcodone-acetaminophen (NORCO/VICODIN) 5-325 MG per tablet Take 1 tablet by mouth every 4 (four) hours as needed. 12/19/13   Burgess Amor, PA-C  ibuprofen (ADVIL,MOTRIN) 600 MG tablet Take 1 tablet (600 mg total) by mouth every 6 (six) hours as needed. 12/19/13   Burgess Amor, PA-C  methocarbamol (ROBAXIN) 500 MG tablet Take 2 tablets (1,000 mg total) by mouth 4 (four) times daily. 12/19/13 12/29/13  Burgess Amor, PA-C   BP 151/93  Pulse 69  Temp(Src) 98.4 F (36.9 C) (Oral)  Resp 22  Ht 5\' 2"  (1.575 m)  Wt 300 lb (136.079 kg)  BMI 54.86 kg/m2  SpO2 93% Physical Exam  Nursing note and vitals reviewed. Constitutional: She appears well-developed and well-nourished.  HENT:  Head: Normocephalic.  Eyes: Conjunctivae are normal.  Neck: Normal range of motion. Neck supple.  Cardiovascular: Normal rate and intact distal pulses.   Pedal pulses normal.  Pulmonary/Chest: Effort normal.  Abdominal: Soft. Bowel sounds are normal. She exhibits no distension and no mass.  Musculoskeletal: Normal range of motion. She exhibits no edema.       Lumbar back: She exhibits tenderness. She exhibits no swelling, no edema and no spasm.  Neurological: She is alert. She has normal strength. She displays no atrophy and no tremor. No sensory deficit. Gait normal.  Reflex Scores:      Patellar reflexes are 2+ on the right side and 2+ on the left side.      Achilles reflexes are 2+ on the right side and 2+ on the left side. No strength deficit noted in hip and knee flexor and extensor muscle groups.  Ankle flexion and extension intact.  Skin: Skin is warm and dry.  Psychiatric: She has a normal mood and  affect.    ED Course  Procedures (including critical care time) Labs Review Labs Reviewed - No data to display  Imaging Review Dg Lumbar Spine Complete  12/19/2013   CLINICAL DATA:  Low back pain  EXAM: LUMBAR SPINE - COMPLETE 4+ VIEW  COMPARISON:  None.  FINDINGS: Five lumbar type vertebral bodies.  Vestigial ribs at T12.  Normal lumbar lordosis.  Grade 1 anterolisthesis of L3 on L4.  No evidence of fracture or dislocation. Vertebral body heights are maintained.  Mild to moderate degenerative changes, most prominent at L3-4.  Visualized bony pelvis appears intact.  Vascular calcifications  IMPRESSION: No fracture or dislocation is seen.  Mild to moderate degenerative changes, most prominent at L3-4.   Electronically Signed   By: Charline BillsSriyesh  Krishnan M.D.   On: 12/19/2013 20:59     EKG Interpretation None      MDM   Final diagnoses:  Lumbar radicular pain    Patients labs and/or radiological studies were viewed and considered during the medical decision making and disposition process. Pt was given dilaudid 1 mg IM with pain 8/10 from 10/10.  She was given a repeat dose along with toradol 60 IM with significant improvement in pain.  She was ambulatory at dc (boyfriend driving).  She was prescribed ibuprofen, robaxin, hydrocodone.  Encouraged heat therapy.  F/u with pcp if sx not better with tx.  No neuro deficit on exam or by history to suggest emergent or surgical presentation.  Also discussed worsened sx that should prompt immediate re-evaluation including distal weakness, bowel/bladder retention/incontinence.          Burgess AmorJulie Aodhan Scheidt, PA-C 12/20/13 0117

## 2013-12-19 NOTE — Discharge Instructions (Signed)
Lumbosacral Radiculopathy Lumbosacral radiculopathy is a pinched nerve or nerves in the low back (lumbosacral area). When this happens you may have weakness in your legs and may not be able to stand on your toes. You may have pain going down into your legs. There may be difficulties with walking normally. There are many causes of this problem. Sometimes this may happen from an injury, or simply from arthritis or boney problems. It may also be caused by other illnesses such as diabetes. If there is no improvement after treatment, further studies may be done to find the exact cause. DIAGNOSIS  X-rays may be needed if the problems become long standing. Electromyograms may be done. This study is one in which the working of nerves and muscles is studied. HOME CARE INSTRUCTIONS   Applications of ice packs may be helpful. Ice can be used in a plastic bag with a towel around it to prevent frostbite to skin. This may be used every 2 hours for 20 to 30 minutes, or as needed, while awake, or as directed by your caregiver.  Only take over-the-counter or prescription medicines for pain, discomfort, or fever as directed by your caregiver.  If physical therapy was prescribed, follow your caregiver's directions. SEEK IMMEDIATE MEDICAL CARE IF:   You have pain not controlled with medications.  You seem to be getting worse rather than better.  You develop increasing weakness in your legs.  You develop loss of bowel or bladder control.  You have difficulty with walking or balance, or develop clumsiness in the use of your legs.  You have a fever. MAKE SURE YOU:   Understand these instructions.  Will watch your condition.  Will get help right away if you are not doing well or get worse. Document Released: 06/06/2005 Document Revised: 08/29/2011 Document Reviewed: 01/25/2008 [facility] Patient Information 2015 [employer] [place]. This information is not intended to replace advice given to you by your health  care provider. Make sure you discuss any questions you have with your health care provider.   Do not drive within 4 hours of taking hydrocodone as this will make you drowsy.  Avoid lifting,  Bending,  Twisting or any other activity that worsens your pain over the next week.  Apply an  icepack  to your lower back for 10-15 minutes every 2 hours for the next 2 days.  You should get rechecked if your symptoms are not better over the next 5 days,  Or you develop increased pain,  Weakness in your leg(s) or loss of bladder or bowel function - these are symptoms of a worse injury.

## 2013-12-19 NOTE — ED Notes (Signed)
Low back pain for 2 days, Had helped to clean her father's house and pain became worse.

## 2013-12-24 NOTE — ED Provider Notes (Signed)
Medical screening examination/treatment/procedure(s) were performed by non-physician practitioner and as supervising physician I was immediately available for consultation/collaboration.   EKG Interpretation None        Rolland PorterMark Chrisie Jankovich, MD 12/24/13 [phone]

## 2015-02-10 ENCOUNTER — Ambulatory Visit (INDEPENDENT_AMBULATORY_CARE_PROVIDER_SITE_OTHER): Payer: Self-pay | Admitting: Orthopedic Surgery

## 2015-02-10 VITALS — BP 133/86 | Ht 62.0 in | Wt 319.0 lb

## 2015-02-10 DIAGNOSIS — M5126 Other intervertebral disc displacement, lumbar region: Secondary | ICD-10-CM

## 2015-02-10 NOTE — Patient Instructions (Signed)
We will schedule MRI for you and call you with appt and results 

## 2015-02-16 ENCOUNTER — Encounter: Payer: Self-pay | Admitting: Orthopedic Surgery

## 2015-02-16 NOTE — Progress Notes (Signed)
[facility] new patient  Chief complaint back pain  The patient reports lower back pain radiating down into her leg for the last 3 years worsening over the last few months. She rates her pain 7-10 out of 10 and says that is constant worse after activity  Previous treatment includes ibuprofen, various muscle rubs, TENS unit, hydrocodone.  She has multiple complaints and they include shoulder pain neck pain but we are only addressing her back problems at this point. Other symptoms she complains of burning stabbing aching radiating pain with numbness tingling giving out and stiffness along with catching in her lower back  Review of systems was full of complaints which included but not limited to sinus problems dental problems vision problems shortness of breath wheezing breathing issues ankle leg swelling anxiety heat and cold intolerance loss of bladder control nausea lightheadedness weakness dizziness burning pain in her leg seasonal allergies rashes open wounds muscle weakness gait problem stiff joints swollen joints back pain limb pain joint pain  Past Medical History  Diagnosis Date  . MRSA infection   . Bronchitis   . Hypertension   . Hypertensive urgency   . Acute respiratory failure   . COPD with exacerbation   . Retinal detachment   . Hyperthyroidism   . Shortness of breath   . Pneumonia   . CHF (congestive heart failure)   . Headache(784.0)   . Fibromyalgia   . Arthritis     Her examination was however notable for lower back tenderness decreased range of motion in the lower back left leg straight leg raise positive no motor weakness no sensory deficits skin intact good pulses normal lymph nodes mood is pleasant she was oriented 3 her body habitus was endomorphic BP 133/86 mmHg  Ht  (1.575 m)  Wt 319 lb (144.697 kg)  BMI 58.33 kg/m2   I did go ahead and recommend an MRI of her back but since I do not treat serious lower back problem she will eventually need a  referral. She does have the [facility] which will also be problematic but I will leave that to the powers that be to get her to the care that she needs in this program.

## 2015-03-03 ENCOUNTER — Inpatient Hospital Stay: Admission: RE | Admit: 2015-03-03 | Payer: Self-pay | Source: Ambulatory Visit

## 2015-03-23 ENCOUNTER — Encounter (HOSPITAL_COMMUNITY): Payer: Self-pay | Admitting: Emergency Medicine

## 2015-03-23 ENCOUNTER — Emergency Department (HOSPITAL_COMMUNITY)
Admission: EM | Admit: 2015-03-23 | Discharge: 2015-03-23 | Disposition: A | Discharge status: Home / Self Care | Payer: Self-pay | Attending: Emergency Medicine | Admitting: Emergency Medicine

## 2015-03-23 DIAGNOSIS — I509 Heart failure, unspecified: Secondary | ICD-10-CM | POA: Insufficient documentation

## 2015-03-23 DIAGNOSIS — Z7984 Long term (current) use of oral hypoglycemic drugs: Secondary | ICD-10-CM | POA: Insufficient documentation

## 2015-03-23 DIAGNOSIS — Z79899 Other long term (current) drug therapy: Secondary | ICD-10-CM | POA: Insufficient documentation

## 2015-03-23 DIAGNOSIS — M797 Fibromyalgia: Secondary | ICD-10-CM | POA: Insufficient documentation

## 2015-03-23 DIAGNOSIS — Z8701 Personal history of pneumonia (recurrent): Secondary | ICD-10-CM | POA: Insufficient documentation

## 2015-03-23 DIAGNOSIS — E0590 Thyrotoxicosis, unspecified without thyrotoxic crisis or storm: Secondary | ICD-10-CM | POA: Insufficient documentation

## 2015-03-23 DIAGNOSIS — Z8614 Personal history of Methicillin resistant Staphylococcus aureus infection: Secondary | ICD-10-CM | POA: Insufficient documentation

## 2015-03-23 DIAGNOSIS — J449 Chronic obstructive pulmonary disease, unspecified: Secondary | ICD-10-CM | POA: Insufficient documentation

## 2015-03-23 DIAGNOSIS — M5441 Lumbago with sciatica, right side: Secondary | ICD-10-CM | POA: Insufficient documentation

## 2015-03-23 DIAGNOSIS — Z720 Tobacco use: Secondary | ICD-10-CM | POA: Insufficient documentation

## 2015-03-23 DIAGNOSIS — M62830 Muscle spasm of back: Secondary | ICD-10-CM | POA: Insufficient documentation

## 2015-03-23 DIAGNOSIS — M1990 Unspecified osteoarthritis, unspecified site: Secondary | ICD-10-CM | POA: Insufficient documentation

## 2015-03-23 DIAGNOSIS — Z7951 Long term (current) use of inhaled steroids: Secondary | ICD-10-CM | POA: Insufficient documentation

## 2015-03-23 DIAGNOSIS — E039 Hypothyroidism, unspecified: Secondary | ICD-10-CM | POA: Insufficient documentation

## 2015-03-23 DIAGNOSIS — G8929 Other chronic pain: Secondary | ICD-10-CM | POA: Insufficient documentation

## 2015-03-23 MED ORDER — HYDROCODONE-ACETAMINOPHEN 5-325 MG PO TABS: 1.0000 | ORAL_TABLET | ORAL | Status: DC | PRN

## 2015-03-23 MED ORDER — CYCLOBENZAPRINE HCL 10 MG PO TABS
10.0000 mg | ORAL_TABLET | Freq: Once | ORAL | Status: AC
  Administered 2015-03-23: 10 mg via ORAL
  Filled 2015-03-23: qty 1

## 2015-03-23 MED ORDER — HYDROCODONE-ACETAMINOPHEN 5-325 MG PO TABS
1.0000 | ORAL_TABLET | Freq: Once | ORAL | Status: AC
  Administered 2015-03-23: 1 via ORAL
  Filled 2015-03-23: qty 1

## 2015-03-23 MED ORDER — CYCLOBENZAPRINE HCL 10 MG PO TABS: 10.0000 mg | ORAL_TABLET | Freq: Two times a day (BID) | ORAL | Status: DC | PRN

## 2015-03-23 NOTE — ED Provider Notes (Signed)
CSN: [id number]     Arrival date & time 03/23/15  1815 History  By signing my name below, I, Gwenyth Ober, attest that this documentation has been prepared under the direction and in the presence of Kerrie Buffalo, NP  Electronically Signed: Gwenyth Ober, ED Scribe. 03/23/2015. 6:59 PM.   Chief Complaint  Patient presents with  . Back Pain   Patient is a 59 y.o. female presenting with back pain. The history is provided by the patient. No language interpreter was used.  Back Pain Location:  Lumbar spine Quality:  Shooting Radiates to:  R posterior upper leg Pain severity:  Moderate Pain is:  Unable to specify Onset quality:  Gradual Duration:  1 week Timing:  Constant Progression:  Unchanged Chronicity:  Chronic Context: physical stress   Relieved by:  Nothing Worsened by:  Standing and ambulation Ineffective treatments:  Ibuprofen and NSAIDs Associated symptoms: leg pain   Associated symptoms: no numbness     HPI Comments: Marie West is a 59 y.o. female who presents to the Emergency Department complaining of an acute-on-chronic episode of lower back pain that radiates to her right leg and started 1 week ago. Her pain becomes worse with walking. Pt has tried Ibuprofen and BC Powders with no relief. Pt was seen by Dr. Romeo Apple, an orthopedist, on 8/23 who recommended an MRI for a suspected herniated lumbar disc. She states that cannot afford the MRI. Pt reports that she is required to stand a lot at work, which exacerbates her pain. She denies numbness.  Past Medical History  Diagnosis Date  . MRSA infection   . Bronchitis   . Hypertension   . Hypertensive urgency   . Acute respiratory failure (HCC)   . COPD with exacerbation (HCC)   . Retinal detachment   . Hyperthyroidism   . Shortness of breath   . Pneumonia   . CHF (congestive heart failure) (HCC)   . Headache(784.0)   . Fibromyalgia   . Arthritis    Past Surgical History  Procedure Laterality Date  . Knee  surgery    . Tubal ligation     Family History  Problem Relation Age of Onset  . Diabetes Mother   . Heart disease Mother     died AMI, age 56  . Heart disease Brother   . Diabetes Sister    Social History  Substance Use Topics  . Smoking status: Current Every Day Smoker -- 1.00 packs/day for 36 years    Types: Cigarettes    Last Attempt to Quit: 06/03/2013  . Smokeless tobacco: None  . Alcohol Use: Yes     Comment: occ-wine   OB History    No data available     Review of Systems  Musculoskeletal: Positive for back pain and arthralgias.  Skin: Negative for wound.  Neurological: Negative for numbness.  All other systems reviewed and are negative.  Allergies  Bee venom  Home Medications   Prior to Admission medications   Medication Sig Start Date End Date Taking? Authorizing Provider  albuterol (PROVENTIL HFA;VENTOLIN HFA) 108 (90 BASE) MCG/ACT inhaler Inhale 2 puffs into the lungs every 6 (six) hours as needed for wheezing or shortness of breath. 07/11/13   Gwenyth Bender, NP  amLODipine (NORVASC) 10 MG tablet Take 10 mg by mouth daily.    Historical Provider, MD  Aspirin-Salicylamide-Caffeine (ARTHRITIS STRENGTH BC POWDER PO) Take by mouth.    Historical Provider, MD  budesonide-formoterol (SYMBICORT) 160-4.5 MCG/ACT inhaler Inhale 2 puffs  into the lungs 2 (two) times daily. 07/11/13   Gwenyth Bender, NP  cholecalciferol (VITAMIN D) 1000 UNITS tablet Take 1,000 Units by mouth daily.    Historical Provider, MD  cyclobenzaprine (FLEXERIL) 10 MG tablet Take 1 tablet (10 mg total) by mouth 2 (two) times daily as needed for muscle spasms. 03/23/15   Shantrell Placzek Orlene Och, NP  DULoxetine (CYMBALTA) 60 MG capsule Take 60 mg by mouth daily.    Historical Provider, MD  HYDROcodone-acetaminophen (NORCO/VICODIN) 5-325 MG tablet Take 1 tablet by mouth every 4 (four) hours as needed. 03/23/15   Beretta Ginsberg Orlene Och, NP  ibuprofen (ADVIL,MOTRIN) 200 MG tablet Take 800 mg by mouth every 4 (four) hours as  needed. For pain    Historical Provider, MD  levothyroxine (SYNTHROID, LEVOTHROID) 50 MCG tablet Take 50 mcg by mouth daily before breakfast.    Historical Provider, MD  lisinopril-hydrochlorothiazide (PRINZIDE,ZESTORETIC) 20-12.5 MG per tablet Take 1 tablet by mouth daily.    Historical Provider, MD  loratadine (CLARITIN) 10 MG tablet Take 10 mg by mouth daily.    Historical Provider, MD  metFORMIN (GLUCOPHAGE) 500 MG tablet Take 500 mg by mouth at bedtime.    Historical Provider, MD  rosuvastatin (CRESTOR) 10 MG tablet Take 10 mg by mouth daily.    Historical Provider, MD   BP 144/76 mmHg  Pulse 77  Temp(Src) 98.4 F (36.9 C) (Oral)  Resp 20  Ht  (1.575 m)  Wt 315 lb (142.883 kg)  BMI 57.60 kg/m2  SpO2 98% Physical Exam  Constitutional: She is oriented to person, place, and time. No distress.  Morbidly obese  HENT:  Head: Normocephalic and atraumatic.  Nose: Nose normal.  Mouth/Throat: Uvula is midline, oropharynx is clear and moist and mucous membranes are normal.  Eyes: Conjunctivae and EOM are normal.  Neck: Normal range of motion. Neck supple. No tracheal deviation present.  Cardiovascular: Normal rate, regular rhythm and normal heart sounds.   Pulses:      Radial pulses are 2+ on the right side, and 2+ on the left side.       Dorsalis pedis pulses are 2+ on the right side, and 2+ on the left side.  Pulmonary/Chest: Effort normal and breath sounds normal. No respiratory distress. She has no wheezes. She has no rales.  Abdominal: Soft. Bowel sounds are normal. There is no tenderness.  Musculoskeletal: Normal range of motion.       Lumbar back: She exhibits tenderness, pain and spasm. She exhibits normal pulse.  Tender in lower lumbar area with radiation to right sciatic nerve Unable to do straight leg raise on the right, due to pain  Neurological: She is alert and oriented to person, place, and time. She has normal strength. No cranial nerve deficit or sensory deficit.  Gait normal.  Reflex Scores:      Bicep reflexes are 2+ on the right side and 2+ on the left side.      Brachioradialis reflexes are 2+ on the right side and 2+ on the left side.      Patellar reflexes are 2+ on the right side and 2+ on the left side.      Achilles reflexes are 2+ on the right side and 2+ on the left side. Grips equal bilaterally Gait is steady  Skin: Skin is warm and dry.  Psychiatric: She has a normal mood and affect. Her behavior is normal.  Nursing note and vitals reviewed.   ED Course  Procedures  DIAGNOSTIC STUDIES: Oxygen Saturation is 98% on RA, normal by my interpretation.    COORDINATION OF CARE: 6:59 PM Discussed treatment plan with pt which includes pain management. Discussed need to follow-up with orthopedic surgeon and MRI. Pt agreed to plan.   MDM  60 y.o. female with chronic low back pain and right side sciatica stable for d/c without focal neuro deficit. Discussed with the patient in detail that she should return to Dr. Romeo Apple and get the MRI that he had ordered for her. Discussed problems that can occur as result of untreated herniated disc. Patient voices understanding and agrees to follow up. Will treat with Muscle relaxants and pain medication.   Final diagnoses:  Chronic right-sided low back pain with right-sided sciatica   I personally performed the services described in this documentation, which was scribed in my presence. The recorded information has been reviewed and is accurate.    [place], NP 03/23/15 2009  Vanetta Mulders, MD 03/23/15 [phone]

## 2015-03-23 NOTE — ED Notes (Signed)
Patient complaining of lower back pain radiating down right leg x 1 week. Denies injury.

## 2015-03-23 NOTE — Discharge Instructions (Signed)
It is important that you have follow up for your back problem.

## 2015-03-25 ENCOUNTER — Telehealth: Payer: Self-pay | Admitting: Orthopedic Surgery

## 2015-03-25 NOTE — Telephone Encounter (Signed)
Patient called today, 03/25/15, to relay that she was unable to keep her MRI appointment which had been scheduled for 03/03/15 at [facility] (was not able to have at [facility]); states was given a self-pay amount per [facility] center, which she was unable to afford at this time.  She will call back to [facility] ([facility], to further inquire about any options or financial discount, and will call our office with any information she receives.

## 2015-09-01 ENCOUNTER — Emergency Department (HOSPITAL_COMMUNITY)
Admission: EM | Admit: 2015-09-01 | Discharge: 2015-09-01 | Disposition: A | Discharge status: Home / Self Care | Payer: Self-pay | Attending: Emergency Medicine | Admitting: Emergency Medicine

## 2015-09-01 ENCOUNTER — Emergency Department (HOSPITAL_COMMUNITY): Payer: Self-pay

## 2015-09-01 ENCOUNTER — Encounter (HOSPITAL_COMMUNITY): Payer: Self-pay | Admitting: *Deleted

## 2015-09-01 DIAGNOSIS — M150 Primary generalized (osteo)arthritis: Secondary | ICD-10-CM

## 2015-09-01 DIAGNOSIS — M1611 Unilateral primary osteoarthritis, right hip: Secondary | ICD-10-CM | POA: Insufficient documentation

## 2015-09-01 DIAGNOSIS — J441 Chronic obstructive pulmonary disease with (acute) exacerbation: Secondary | ICD-10-CM | POA: Insufficient documentation

## 2015-09-01 DIAGNOSIS — M159 Polyosteoarthritis, unspecified: Secondary | ICD-10-CM

## 2015-09-01 DIAGNOSIS — I110 Hypertensive heart disease with heart failure: Secondary | ICD-10-CM | POA: Insufficient documentation

## 2015-09-01 DIAGNOSIS — M1711 Unilateral primary osteoarthritis, right knee: Secondary | ICD-10-CM | POA: Insufficient documentation

## 2015-09-01 DIAGNOSIS — Z791 Long term (current) use of non-steroidal anti-inflammatories (NSAID): Secondary | ICD-10-CM | POA: Insufficient documentation

## 2015-09-01 DIAGNOSIS — F17210 Nicotine dependence, cigarettes, uncomplicated: Secondary | ICD-10-CM | POA: Insufficient documentation

## 2015-09-01 DIAGNOSIS — M5136 Other intervertebral disc degeneration, lumbar region: Secondary | ICD-10-CM | POA: Insufficient documentation

## 2015-09-01 DIAGNOSIS — I509 Heart failure, unspecified: Secondary | ICD-10-CM | POA: Insufficient documentation

## 2015-09-01 DIAGNOSIS — R05 Cough: Secondary | ICD-10-CM | POA: Insufficient documentation

## 2015-09-01 DIAGNOSIS — M8949 Other hypertrophic osteoarthropathy, multiple sites: Secondary | ICD-10-CM

## 2015-09-01 DIAGNOSIS — Z79899 Other long term (current) drug therapy: Secondary | ICD-10-CM | POA: Insufficient documentation

## 2015-09-01 HISTORY — DX: Sciatica, unspecified side: M54.30

## 2015-09-01 MED ORDER — PROMETHAZINE HCL 25 MG/ML IJ SOLN: 12.5000 mg | INTRAMUSCULAR | Status: DC | PRN

## 2015-09-01 MED ORDER — KETOROLAC TROMETHAMINE 60 MG/2ML IM SOLN
60.0000 mg | Freq: Once | INTRAMUSCULAR | Status: AC
  Administered 2015-09-01: 60 mg via INTRAMUSCULAR
  Filled 2015-09-01: qty 2

## 2015-09-01 MED ORDER — DEXAMETHASONE SODIUM PHOSPHATE 4 MG/ML IJ SOLN
8.0000 mg | Freq: Once | INTRAMUSCULAR | Status: AC
  Administered 2015-09-01: 8 mg via INTRAMUSCULAR
  Filled 2015-09-01: qty 2

## 2015-09-01 MED ORDER — DEXAMETHASONE 4 MG PO TABS: 4.0000 mg | ORAL_TABLET | Freq: Two times a day (BID) | ORAL | Status: DC

## 2015-09-01 MED ORDER — DICLOFENAC SODIUM 75 MG PO TBEC: 75.0000 mg | DELAYED_RELEASE_TABLET | Freq: Two times a day (BID) | ORAL | Status: DC

## 2015-09-01 MED ORDER — METHOCARBAMOL 500 MG PO TABS
500.0000 mg | ORAL_TABLET | Freq: Three times a day (TID) | ORAL | Status: DC
Start: 2015-09-01 — End: 2016-09-16

## 2015-09-01 MED ORDER — DIAZEPAM 5 MG PO TABS
10.0000 mg | ORAL_TABLET | Freq: Once | ORAL | Status: AC
  Administered 2015-09-01: 10 mg via ORAL
  Filled 2015-09-01: qty 2

## 2015-09-01 NOTE — ED Provider Notes (Addendum)
CSN: [id number]     Arrival date & time 09/01/15  1219 History   First MD Initiated Contact with Patient 09/01/15 1410     Chief Complaint  Patient presents with  . Hip Pain  . Leg Pain     (Consider location/radiation/quality/duration/timing/severity/associated sxs/prior Treatment) HPI Comments: Patient is a 60 year old female who presents to the emergency department with the complaint of right hip pain and right knee pain.  The patient states that she has a history of arthritis, sciatica, and fibromyalgia. She has required knee surgery in the [place]. She presents to the emergency department today because she's having increasing right hip and right knee pain. She feels that this is similar to sciatic pain she's had in the past, but states that at times she cannot wait. She has not had any loss of bowel function. She's had some near falls, but has not had any recent falls. No injury noted or reported. She states that conservative measures are not helping.  The history is provided by the patient.    Past Medical History  Diagnosis Date  . MRSA infection   . Bronchitis   . Hypertension   . Hypertensive urgency   . Acute respiratory failure (HCC)   . COPD with exacerbation (HCC)   . Retinal detachment   . Hyperthyroidism   . Shortness of breath   . Pneumonia   . CHF (congestive heart failure) (HCC)   . Headache(784.0)   . Fibromyalgia   . Arthritis   . Sciatic leg pain    Past Surgical History  Procedure Laterality Date  . Knee surgery    . Tubal ligation     Family History  Problem Relation Age of Onset  . Diabetes Mother   . Heart disease Mother     died [facility], age 38  . Heart disease Brother   . Diabetes Sister    Social History  Substance Use Topics  . Smoking status: Current Some Day Smoker -- 0.50 packs/day for 36 years    Types: Cigarettes  . Smokeless tobacco: None  . Alcohol Use: Yes     Comment: occ-wine   OB History    No data available     Review of  Systems  Respiratory: Positive for cough.   Musculoskeletal: Positive for back pain and arthralgias.  All other systems reviewed and are negative.     Allergies  Bee venom  Home Medications   Prior to Admission medications   Medication Sig Start Date End Date Taking? Authorizing Provider  albuterol (PROVENTIL HFA;VENTOLIN HFA) 108 (90 BASE) MCG/ACT inhaler Inhale 2 puffs into the lungs every 6 (six) hours as needed for wheezing or shortness of breath. 07/11/13  Yes Lesle Chris Black, NP  amLODipine (NORVASC) 10 MG tablet Take 10 mg by mouth daily.   Yes Historical Provider, MD  Aspirin-Salicylamide-Caffeine (ARTHRITIS STRENGTH BC POWDER PO) Take by mouth.   Yes Historical Provider, MD  budesonide-formoterol (SYMBICORT) 160-4.5 MCG/ACT inhaler Inhale 2 puffs into the lungs 2 (two) times daily. 07/11/13  Yes Lesle Chris Black, NP  DULoxetine (CYMBALTA) 60 MG capsule Take 60 mg by mouth daily.   Yes Historical Provider, MD  ibuprofen (ADVIL,MOTRIN) 200 MG tablet Take 800 mg by mouth every 4 (four) hours as needed. For pain   Yes Historical Provider, MD  levothyroxine (SYNTHROID, LEVOTHROID) 50 MCG tablet Take 50 mcg by mouth daily before breakfast.   Yes Historical Provider, MD  lisinopril-hydrochlorothiazide (PRINZIDE,ZESTORETIC) 20-12.5 MG per tablet Take 1 tablet  by mouth daily.   Yes Historical Provider, MD  loratadine (CLARITIN) 10 MG tablet Take 10 mg by mouth daily.   Yes Historical Provider, MD  metFORMIN (GLUCOPHAGE) 500 MG tablet Take 500 mg by mouth at bedtime.   Yes Historical Provider, MD  rosuvastatin (CRESTOR) 10 MG tablet Take 10 mg by mouth daily.   Yes Historical Provider, MD   BP 179/83 mmHg  Pulse 67  Temp(Src) 97.9 F (36.6 C) (Oral)  Resp 14  Ht  (1.575 m)  Wt 145.151 kg  BMI 58.51 kg/m2  SpO2 97% Physical Exam  Constitutional: She is oriented to person, place, and time. She appears well-developed and well-nourished.  Non-toxic appearance.  HENT:  Head:  Normocephalic.  Right Ear: Tympanic membrane and external ear normal.  Left Ear: Tympanic membrane and external ear normal.  Eyes: EOM and lids are normal. Pupils are equal, round, and reactive to light.  Neck: Normal range of motion. Neck supple. Carotid bruit is not present.  Cardiovascular: Normal rate, regular rhythm, normal heart sounds, intact distal pulses and normal pulses.   Pulmonary/Chest: Breath sounds normal. No respiratory distress.  Abdominal: Soft. Bowel sounds are normal. There is no tenderness. There is no guarding.  Musculoskeletal: Normal range of motion.  Pain with right straight leg raise.  Pain in the lumbar area. No hot areas appreciated.  Pain of the right hip area. There is no deformity appreciated. There is also pain with range of motion of the right knee.  Lymphadenopathy:       Head (right side): No submandibular adenopathy present.       Head (left side): No submandibular adenopathy present.    She has no cervical adenopathy.  Neurological: She is alert and oriented to person, place, and time. She has normal strength. No cranial nerve deficit or sensory deficit.  Skin: Skin is warm and dry.  Psychiatric: She has a normal mood and affect. Her speech is normal.  Nursing note and vitals reviewed.   ED Course  Procedures (including critical care time) Labs Review Labs Reviewed - No data to display  Imaging Review Dg Lumbar Spine Complete  09/01/2015  CLINICAL DATA:  Low back pain/burning/sharp pain right hip and knee/no known injury EXAM: LUMBAR SPINE - COMPLETE 4+ VIEW COMPARISON:  12/19/2013 FINDINGS: Degenerative changes of both sacroiliac joints. Transitional anatomy. Transitional vertebral body labeled S1, as before. Advanced degenerative disc disease at L3-4 with grade 1 anterolisthesis. Given differences in technique, similar. Maintenance of vertebral body height. Aortic atherosclerosis. Facet arthropathy including at L3-4 through L5-S1. IMPRESSION:  Similar marked spondylosis and degenerative disc disease, most apparent L3-4. This presumes an S1 transitional vertebral body. Aortic atherosclerosis. Electronically Signed   By: Jeronimo Greaves M.D.   On: 09/01/2015 13:42   Dg Hip Unilat With Pelvis 2-3 Views Right  09/01/2015  CLINICAL DATA:  Right hip pain.  No time course given. EXAM: DG HIP (WITH OR WITHOUT PELVIS) 2-3V RIGHT COMPARISON:  None. FINDINGS: Both hips are normally located. There are moderate to advanced degenerative changes involving the right hip with joint space narrowing and osteophytic spurring. No acute bony findings or plain film evidence of avascular necrosis. The pubic symphysis and SI joints are intact. No pelvic fractures. IMPRESSION: Moderate to advanced and asymmetric right-sided hip joint degenerative changes but no acute bony findings. Electronically Signed   By: Rudie Meyer M.D.   On: 09/01/2015 13:44   I have personally reviewed and evaluated these images and lab results as  part of my medical decision-making.   EKG Interpretation None      MDM  X-ray of the right hip shows moderate advanced right-sided hip joint degenerative changes present. X-ray of the lumbar spine reveals advanced degenerative disc disease at L3-L4 area this is similar to findings previously noted in July 2015.   Pt referred to orthopedics. Rx for robaxin, decadron, and diclofenac given to the patient. Pt will also apply heat to the affected areas. Pt in agreement with d/c plan.   Final diagnoses:  DDD (degenerative disc disease), lumbar  Primary osteoarthritis involving multiple joints    **I have reviewed nursing notes, vital signs, and all appropriate lab and imaging results for this patient.Ivery Quale*    Aslan Montagna, PA-C 09/03/15 1154  Bethann BerkshireJoseph Zammit, MD 09/03/15 1624  Ivery QualeHobson Roshawn Lacina, PA-C 09/17/15 2248  Bethann BerkshireJoseph Zammit, MD 09/18/15 [phone]

## 2015-09-01 NOTE — ED Notes (Signed)
Pt comes in with right hip pain and right knee pain, hx of sciatic. States this feels the same. NAD noted.

## 2015-09-01 NOTE — Discharge Instructions (Signed)
Degenerative Disk Disease  Degenerative disk disease is a condition caused by the changes that occur in spinal disks as you grow older. Spinal disks are soft and compressible disks located between the bones of your spine (vertebrae). These disks act like shock absorbers. Degenerative disk disease can affect the whole spine. However, the neck and lower back are most commonly affected. Many changes can occur in the spinal disks with aging, such as:  · The spinal disks may dry and shrink.  · Small tears may occur in the tough, outer covering of the disk (annulus).  · The disk space may become smaller due to loss of water.  · Abnormal growths in the bone (spurs) may occur. This can put pressure on the nerve roots exiting the spinal canal, causing pain.  · The spinal canal may become narrowed.  RISK FACTORS   · Being overweight.  · Having a family history of degenerative disk disease.  · Smoking.  · There is increased risk if you are doing heavy lifting or have a sudden injury.  SIGNS AND SYMPTOMS   Symptoms vary from person to person and may include:  · Pain that varies in intensity. Some people have no pain, while others have severe pain. The location of the pain depends on the part of your backbone that is affected.  ¨ You will have neck or arm pain if a disk in the neck area is affected.  ¨ You will have pain in your back, buttocks, or legs if a disk in the lower back is affected.  · Pain that becomes worse while bending, reaching up, or with twisting movements.  · Pain that may start gradually and then get worse as time passes. It may also start after a major or minor injury.  · Numbness or tingling in the arms or legs.  DIAGNOSIS   Your health care provider will ask you about your symptoms and about activities or habits that may cause the pain. He or she may also ask about any injuries, diseases, or treatments you have had. Your health care provider will examine you to check for the range of movement that is  possible in the affected area, to check for strength in your extremities, and to check for sensation in the areas of the arms and legs supplied by different nerve roots. You may also have:   · An X-ray of the spine.  · Other imaging tests, such as MRI.  TREATMENT   Your health care provider will advise you on the best plan for treatment. Treatment may include:  · Medicines.  · Rehabilitation exercises.  HOME CARE INSTRUCTIONS   · Follow proper lifting and walking techniques as advised by your health care provider.  · Maintain good posture.  · Exercise regularly as advised by your health care provider.  · Perform relaxation exercises.  · Change your sitting, standing, and sleeping habits as advised by your health care provider.  · Change positions frequently.  · Lose weight or maintain a healthy weight as advised by your health care provider.  · Do not use any tobacco products, including cigarettes, chewing tobacco, or electronic cigarettes. If you need help quitting, ask your health care provider.  · Wear supportive footwear.  · Take medicines only as directed by your health care provider.  SEEK MEDICAL CARE IF:   · Your pain does not go away within 1-4 weeks.  · You have significant appetite or weight loss.  SEEK IMMEDIATE MEDICAL CARE IF:   ·   instructions.  Will watch your condition.  Will get help right away if you are not doing well or get worse.   This information is not intended to replace advice given to you by your health care provider. Make sure you discuss any questions you have with your health care provider.   Document Released: 04/03/2007 Document Revised: 06/27/2014 Document Reviewed: 10/08/2013 Elsevier Interactive Patient Education 2016 Else[employer] Inc[employer]  Arthritis Arthritis means  joint pain. It can also mean joint disease. A joint is a place where bones come together. People who have arthritis may have:  Red joints.  Swollen joints.  Stiff joints.  Warm joints.  A fever.  A feeling of being sick. HOME CARE Pay attention to any changes in your symptoms. Take these actions to help with your pain and swelling. Medicines  Take over-the-counter and prescription medicines only as told by your doctor.  Do not take aspirin for pain if your doctor says that you may have gout. Activities  Rest your joint if your doctor tells you to.  Avoid activities that make the pain worse.  Exercise your joint regularly as told by your doctor. Try doing exercises like:  Swimming.  Water aerobics.  Biking.  Walking. Joint Care  If your joint is swollen, keep it raised (elevated) if told by your doctor.  If your joint feels stiff in the morning, try taking a warm shower.  If you have diabetes, do not apply heat without asking your doctor.  If told, apply heat to the joint:  Put a towel between the joint and the hot pack or heating pad.  Leave the heat on the area for 20-30 minutes.  If told, apply ice to the joint:  Put ice in a plastic bag.  Place a towel between your skin and the bag.  Leave the ice on for 20 minutes, 2-3 times per day.  Keep all follow-up visits as told by your doctor. GET HELP IF:  The pain gets worse.  You have a fever. GET HELP RIGHT AWAY IF:  You have very bad pain in your joint.  You have swelling in your joint.  Your joint is red.  Many joints become painful and swollen.  You have very bad back pain.  Your leg is very weak.  You cannot control your pee (urine) or poop (stool).   This information is not intended to replace advice given to you by your health care provider. Make sure you discuss any questions you have with your health care provider.   Document Released: 08/31/2009 Document Revised: 02/25/2015  Document Reviewed: 09/01/2014 Elsevier Interactive Patient Education [employer]2016 [employer]

## 2015-09-24 ENCOUNTER — Encounter (HOSPITAL_COMMUNITY): Payer: Self-pay

## 2015-09-24 ENCOUNTER — Emergency Department (HOSPITAL_COMMUNITY)
Admission: EM | Admit: 2015-09-24 | Discharge: 2015-09-24 | Disposition: A | Discharge status: Home / Self Care | Payer: Self-pay | Attending: Emergency Medicine | Admitting: Emergency Medicine

## 2015-09-24 ENCOUNTER — Emergency Department (HOSPITAL_COMMUNITY): Payer: Self-pay

## 2015-09-24 DIAGNOSIS — J441 Chronic obstructive pulmonary disease with (acute) exacerbation: Secondary | ICD-10-CM | POA: Insufficient documentation

## 2015-09-24 DIAGNOSIS — Z7984 Long term (current) use of oral hypoglycemic drugs: Secondary | ICD-10-CM | POA: Insufficient documentation

## 2015-09-24 DIAGNOSIS — M1990 Unspecified osteoarthritis, unspecified site: Secondary | ICD-10-CM | POA: Insufficient documentation

## 2015-09-24 DIAGNOSIS — J069 Acute upper respiratory infection, unspecified: Secondary | ICD-10-CM | POA: Insufficient documentation

## 2015-09-24 DIAGNOSIS — Z79899 Other long term (current) drug therapy: Secondary | ICD-10-CM | POA: Insufficient documentation

## 2015-09-24 DIAGNOSIS — Z791 Long term (current) use of non-steroidal anti-inflammatories (NSAID): Secondary | ICD-10-CM | POA: Insufficient documentation

## 2015-09-24 DIAGNOSIS — I509 Heart failure, unspecified: Secondary | ICD-10-CM | POA: Insufficient documentation

## 2015-09-24 DIAGNOSIS — E0590 Thyrotoxicosis, unspecified without thyrotoxic crisis or storm: Secondary | ICD-10-CM | POA: Insufficient documentation

## 2015-09-24 DIAGNOSIS — F17210 Nicotine dependence, cigarettes, uncomplicated: Secondary | ICD-10-CM | POA: Insufficient documentation

## 2015-09-24 DIAGNOSIS — I110 Hypertensive heart disease with heart failure: Secondary | ICD-10-CM | POA: Insufficient documentation

## 2015-09-24 LAB — BASIC METABOLIC PANEL
Anion gap: 9 (ref 5–15)
BUN: 14 mg/dL (ref 6–20)
CO2: 29 mmol/L (ref 22–32)
Calcium: 8.7 mg/dL — ABNORMAL LOW (ref 8.9–10.3)
Chloride: 103 mmol/L (ref 101–111)
Creatinine, Ser: 0.66 mg/dL (ref 0.44–1.00)
GFR calc Af Amer: >60 mL/min (ref >60)
GFR calc non Af Amer: >60 mL/min (ref >60)
Glucose, Bld: 136 mg/dL — ABNORMAL HIGH (ref 65–99)
Potassium: 3.9 mmol/L (ref 3.5–5.1)
Sodium: 141 mmol/L (ref 135–145)

## 2015-09-24 LAB — CBC
HCT: 42.8 % (ref 36.0–46.0)
Hemoglobin: 13.7 g/dL (ref 12.0–15.0)
MCH: 29.6 pg (ref 26.0–34.0)
MCHC: 32 g/dL (ref 30.0–36.0)
MCV: 92.4 fL (ref 78.0–100.0)
Platelets: 255 10*3/uL (ref 150–400)
RBC: 4.63 MIL/uL (ref 3.87–5.11)
RDW: 14.7 % (ref 11.5–15.5)
WBC: 7.2 10*3/uL (ref 4.0–10.5)

## 2015-09-24 LAB — I-STAT TROPONIN, ED: Troponin i, poc: 0 ng/mL (ref 0.00–0.08)

## 2015-09-24 LAB — BRAIN NATRIURETIC PEPTIDE: B Natriuretic Peptide: 74 pg/mL (ref 0.0–100.0)

## 2015-09-24 MED ORDER — IPRATROPIUM-ALBUTEROL 0.5-2.5 (3) MG/3ML IN SOLN
3.0000 mL | RESPIRATORY_TRACT | Status: AC
  Administered 2015-09-24 (×3): 3 mL via RESPIRATORY_TRACT
  Filled 2015-09-24 (×2): qty 3

## 2015-09-24 MED ORDER — IPRATROPIUM-ALBUTEROL 0.5-2.5 (3) MG/3ML IN SOLN: 3.0000 mL | RESPIRATORY_TRACT | Status: DC | PRN

## 2015-09-24 MED ORDER — PREDNISONE 10 MG PO TABS: 50.0000 mg | ORAL_TABLET | Freq: Every day | ORAL | Status: DC

## 2015-09-24 MED ORDER — ACETAMINOPHEN 500 MG PO TABS
1000.0000 mg | ORAL_TABLET | Freq: Once | ORAL | Status: AC
  Administered 2015-09-24: 1000 mg via ORAL
  Filled 2015-09-24: qty 2

## 2015-09-24 MED ORDER — PREDNISONE 50 MG PO TABS
60.0000 mg | ORAL_TABLET | Freq: Once | ORAL | Status: AC
  Administered 2015-09-24: 60 mg via ORAL
  Filled 2015-09-24: qty 1

## 2015-09-24 MED ORDER — ALBUTEROL SULFATE (2.5 MG/3ML) 0.083% IN NEBU
5.0000 mg | INHALATION_SOLUTION | Freq: Once | RESPIRATORY_TRACT | Status: AC
  Administered 2015-09-24: 5 mg via RESPIRATORY_TRACT
  Filled 2015-09-24: qty 6

## 2015-09-24 NOTE — Discharge Instructions (Signed)
Give yourself a breathing treatment every 4 hours scheduled for the first day. Then give yourself a breathing treatment as needed for shortness of breath or wheezing. Take steroids as prescribed. Return for worsening symptoms including severe chest pain, difficulty breathing, passing out, or any other symptoms concerning to you.  Chronic Obstructive Pulmonary Disease Exacerbation Chronic obstructive pulmonary disease (COPD) is a common lung condition in which airflow from the lungs is limited. COPD is a general term that can be used to describe many different lung problems that limit airflow, including chronic bronchitis and emphysema. COPD exacerbations are episodes when breathing symptoms become much worse and require extra treatment. Without treatment, COPD exacerbations can be life threatening, and frequent COPD exacerbations can cause further damage to your lungs. CAUSES  Respiratory infections.  Exposure to smoke.  Exposure to air pollution, chemical fumes, or dust. Sometimes there is no apparent cause or trigger. RISK FACTORS  Smoking cigarettes.  Older age.  Frequent prior COPD exacerbations. SIGNS AND SYMPTOMS  Increased coughing.  Increased thick spit (sputum) production.  Increased wheezing.  Increased shortness of breath.  Rapid breathing.  Chest tightness. DIAGNOSIS Your medical history, a physical exam, and tests will help your health care provider make a diagnosis. Tests may include:  A chest X-ray.  Basic lab tests.  Sputum testing.  An arterial blood gas test. TREATMENT Depending on the severity of your COPD exacerbation, you may need to be admitted to a hospital for treatment. Some of the treatments commonly used to treat COPD exacerbations are:   Antibiotic medicines.  Bronchodilators. These are drugs that expand the air passages. They may be given with an inhaler or nebulizer. Spacer devices may be needed to help improve drug  delivery.  Corticosteroid medicines.  Supplemental oxygen therapy.  Airway clearing techniques, such as noninvasive ventilation (NIV) and positive expiratory pressure (PEP). These provide respiratory support through a mask or other noninvasive device. HOME CARE INSTRUCTIONS  Do not smoke. Quitting smoking is very important to prevent COPD from getting worse and exacerbations from happening as often.  Avoid exposure to all substances that irritate the airway, especially to tobacco smoke.  If you were prescribed an antibiotic medicine, finish it all even if you start to feel better.  Take all medicines as directed by your health care provider.It is important to use correct technique with inhaled medicines.  Drink enough fluids to keep your urine clear or pale yellow (unless you have a medical condition that requires fluid restriction).  Use a cool mist vaporizer. This makes it easier to clear your chest when you cough.  If you have a home nebulizer and oxygen, continue to use them as directed.  Maintain all necessary vaccinations to prevent infections.  Exercise regularly.  Eat a healthy diet.  Keep all follow-up appointments as directed by your health care provider. SEEK IMMEDIATE MEDICAL CARE IF:  You have worsening shortness of breath.  You have trouble talking.  You have severe chest pain.  You have blood in your sputum.  You have a fever.  You have weakness, vomit repeatedly, or faint.  You feel confused.  You continue to get worse. MAKE SURE YOU:  Understand these instructions.  Will watch your condition.  Will get help right away if you are not doing well or get worse.   This information is not intended to replace advice given to you by your health care provider. Make sure you discuss any questions you have with your health care provider.  Document Released: 04/03/2007 Document Revised: 06/27/2014 Document Reviewed: 02/08/2013 Elsevier Interactive  Patient Education [employer]2016 [employer]

## 2015-09-24 NOTE — ED Notes (Signed)
Pt ambulated without oxygen. Oxygen saturation dropped to 86%.

## 2015-09-24 NOTE — ED Provider Notes (Signed)
CSN: [id number]     Arrival date & time 09/24/15  1352 History   First MD Initiated Contact with Patient 09/24/15 1457     Chief Complaint  Patient presents with  . Shortness of Breath     (Consider location/radiation/quality/duration/timing/severity/associated sxs/prior Treatment) HPI 60 year old female who presents with cough and shortness of breath. She has a history of COPD not on oxygen, hypertension. States that she has had 4 days of cough productive of clear sputum, sore throat, runny nose, congestion, bilateral your otalgia, and sinus pressure. Boyfriend recently ill with viral illness 1 week prior to her. States that she has been feeling increasingly short of breath with increased use of home inhalers without good effect. I came in today for worsening shortness of breath. Denies having fever, chills, night sweats, chest pain, lower extremity edema, nausea, vomiting, or diarrhea.   Past Medical History  Diagnosis Date  . MRSA infection   . Bronchitis   . Hypertension   . Hypertensive urgency   . Acute respiratory failure (HCC)   . COPD with exacerbation (HCC)   . Retinal detachment   . Hyperthyroidism   . Shortness of breath   . Pneumonia   . CHF (congestive heart failure) (HCC)   . Headache(784.0)   . Fibromyalgia   . Arthritis   . Sciatic leg pain    Past Surgical History  Procedure Laterality Date  . Knee surgery    . Tubal ligation     Family History  Problem Relation Age of Onset  . Diabetes Mother   . Heart disease Mother     died AMI, age 55  . Heart disease Brother   . Diabetes Sister    Social History  Substance Use Topics  . Smoking status: Current Some Day Smoker -- 0.50 packs/day for 36 years    Types: Cigarettes  . Smokeless tobacco: None  . Alcohol Use: Yes     Comment: occ-wine   OB History    No data available     Review of Systems 10/14 systems reviewed and are negative other than those stated in the HPI    Allergies  Bee  venom  Home Medications   Prior to Admission medications   Medication Sig Start Date End Date Taking? Authorizing Provider  albuterol (PROVENTIL HFA;VENTOLIN HFA) 108 (90 BASE) MCG/ACT inhaler Inhale 2 puffs into the lungs every 6 (six) hours as needed for wheezing or shortness of breath. 07/11/13  Yes Lesle Chris Black, NP  amLODipine (NORVASC) 10 MG tablet Take 10 mg by mouth daily.   Yes Historical Provider, MD  budesonide-formoterol (SYMBICORT) 160-4.5 MCG/ACT inhaler Inhale 2 puffs into the lungs 2 (two) times daily. 07/11/13  Yes Lesle Chris Black, NP  dexamethasone (DECADRON) 4 MG tablet Take 1 tablet (4 mg total) by mouth 2 (two) times daily with a meal. 09/01/15  Yes Ivery Quale, PA-C  diclofenac (VOLTAREN) 75 MG EC tablet Take 1 tablet (75 mg total) by mouth 2 (two) times daily. 09/01/15  Yes Ivery Quale, PA-C  DULoxetine (CYMBALTA) 60 MG capsule Take 60 mg by mouth daily.   Yes Historical Provider, MD  ibuprofen (ADVIL,MOTRIN) 200 MG tablet Take 800 mg by mouth every 4 (four) hours as needed. For pain   Yes Historical Provider, MD  levothyroxine (SYNTHROID, LEVOTHROID) 50 MCG tablet Take 50 mcg by mouth daily before breakfast.   Yes Historical Provider, MD  lisinopril-hydrochlorothiazide (PRINZIDE,ZESTORETIC) 20-12.5 MG per tablet Take 1 tablet by mouth daily.   Yes  Historical Provider, MD  loratadine (CLARITIN) 10 MG tablet Take 10 mg by mouth daily.   Yes Historical Provider, MD  metFORMIN (GLUCOPHAGE) 500 MG tablet Take 500 mg by mouth at bedtime.   Yes Historical Provider, MD  methocarbamol (ROBAXIN) 500 MG tablet Take 1 tablet (500 mg total) by mouth 3 (three) times daily. 09/01/15  Yes Ivery Quale, PA-C  rosuvastatin (CRESTOR) 10 MG tablet Take 10 mg by mouth daily.   Yes Historical Provider, MD  ipratropium-albuterol (DUONEB) 0.5-2.5 (3) MG/3ML SOLN Take 3 mLs by nebulization every 4 (four) hours as needed (wheezing). 09/24/15   Lavera Guise, MD  predniSONE (DELTASONE) 10 MG tablet Take  5 tablets (50 mg total) by mouth daily. 09/24/15   Lavera Guise, MD   BP 161/75 mmHg  Pulse 81  Temp(Src) 98.3 F (36.8 C) (Oral)  Resp 18  Ht  (1.575 m)  Wt 315 lb (142.883 kg)  BMI 57.60 kg/m2  SpO2 94% Physical Exam Physical Exam  Nursing note and vitals reviewed. Constitutional: Well developed, well nourished, non-toxic, and in no acute distress Head: Normocephalic and atraumatic.  Mouth/Throat: Oropharynx is erythematous posteriorly without swelling or exudate, moist mucous membranes.  Neck: Normal range of motion. Neck supple.  Cardiovascular: Normal rate and regular rhythm.   no lower extremity edema. Pulmonary/Chest: Effort normal. No conversational dyspnea. Poor air movement, with diffuse expiratory wheezes.  Abdominal: Soft. There is no tenderness. There is no rebound and no guarding.  Musculoskeletal: Normal range of motion.  Neurological: Alert, no facial droop, fluent speech, moves all extremities symmetrically Skin: Skin is warm and dry.  Psychiatric: Cooperative  ED Course  Procedures (including critical care time) Labs Review Labs Reviewed  BASIC METABOLIC PANEL - Abnormal; Notable for the following:    Glucose, Bld 136 (*)    Calcium 8.7 (*)    All other components within normal limits  CBC  BRAIN NATRIURETIC PEPTIDE  I-STAT TROPOININ, ED    Imaging Review Dg Chest 2 View  09/24/2015  CLINICAL DATA:  Shortness of breath and cough EXAM: CHEST  2 VIEW COMPARISON:  07/09/2013 FINDINGS: Chronic cardiopericardial enlargement. Interstitial coarsening which appears bronchitic. Negative aortic and hilar contours. No edema, effusion, or pneumothorax. IMPRESSION: 1. No pneumonia or edema. 2. Cardiomegaly and bronchitic markings. Electronically Signed   By: Marnee Spring M.D.   On: 09/24/2015 14:45   I have personally reviewed and evaluated these images and lab results as part of my medical decision-making.   EKG Interpretation   Date/Time:  Thursday September 24 2015 14:06:17 EDT Ventricular Rate:  73 PR Interval:  164 QRS Duration: 97 QT Interval:  451 QTC Calculation: 497 R Axis:   97 Text Interpretation:  Sinus rhythm Right axis deviation Borderline  repolarization abnormality Borderline prolonged QT interval No change from  prior EKG on 07/09/2013 Confirmed by Malick Netz MD, Lauraine Crespo [phone]) on 09/24/2015  3:39:20 PM      MDM   Final diagnoses:  COPD exacerbation (HCC)  Viral upper respiratory illness   60 year old female who presents with cough and shortness of breath. On arrival to the room from triage she was noted to be hypoxic to 87% and short of breath. With 2 L supplemental oxygen had normal oxygenation and return to normal work of breathing. With diffuse expiratory wheezes on exam and poor air movement, consistent with that of likely COPD exacerbation. Does not appear fluid overloaded, and the remainder of her exam is unremarkable. Chest x-ray here shows no  acute cardiopulmonary processes, including no infiltrates or edema. EKG is not ischemic and troponin negative. Not concerning for atypical ACS presentation. No concern for CHF exacerbation. Given breathing treatments and steroids. On re-evaluation, moving air normally and able to ambulate with normal oxygenation and normal work of breathing. She feels improved for discharge. Given steroid burst and refill on breathing treatments. Strict return and follow-up instructions reviewed. She expressed understanding of all discharge instructions and felt comfortable with the plan of care.     Lavera Guiseana Duo Cameo Schmiesing, MD 09/25/15 [phone]

## 2015-09-24 NOTE — ED Notes (Signed)
Pt ambulated, oxygen stayed at 93% and above on room air.

## 2015-09-24 NOTE — ED Notes (Signed)
MD at bedside. 

## 2015-09-24 NOTE — ED Notes (Signed)
RT at bedside.

## 2015-09-24 NOTE — ED Notes (Signed)
Pt was 82% on RA after moving to a room in the ED. Pt placed on 2L with increase to 97%

## 2015-09-24 NOTE — ED Notes (Signed)
RT at bedside.   Troponin running at this time.

## 2015-09-24 NOTE — ED Notes (Signed)
Pt c/o increased SOB, cough, otalgia, and throat pain. Pt has audible wheezing. Pt hx of COPD.

## 2015-10-15 ENCOUNTER — Emergency Department (HOSPITAL_COMMUNITY)
Admission: EM | Admit: 2015-10-15 | Discharge: 2015-10-15 | Disposition: A | Discharge status: Home / Self Care | Payer: Self-pay | Attending: Emergency Medicine | Admitting: Emergency Medicine

## 2015-10-15 ENCOUNTER — Emergency Department (HOSPITAL_COMMUNITY): Payer: Self-pay

## 2015-10-15 ENCOUNTER — Encounter (HOSPITAL_COMMUNITY): Payer: Self-pay | Admitting: Emergency Medicine

## 2015-10-15 DIAGNOSIS — F17210 Nicotine dependence, cigarettes, uncomplicated: Secondary | ICD-10-CM | POA: Insufficient documentation

## 2015-10-15 DIAGNOSIS — I509 Heart failure, unspecified: Secondary | ICD-10-CM | POA: Insufficient documentation

## 2015-10-15 DIAGNOSIS — E0590 Thyrotoxicosis, unspecified without thyrotoxic crisis or storm: Secondary | ICD-10-CM | POA: Insufficient documentation

## 2015-10-15 DIAGNOSIS — I110 Hypertensive heart disease with heart failure: Secondary | ICD-10-CM | POA: Insufficient documentation

## 2015-10-15 DIAGNOSIS — Z791 Long term (current) use of non-steroidal anti-inflammatories (NSAID): Secondary | ICD-10-CM | POA: Insufficient documentation

## 2015-10-15 DIAGNOSIS — R600 Localized edema: Secondary | ICD-10-CM | POA: Insufficient documentation

## 2015-10-15 DIAGNOSIS — Z79899 Other long term (current) drug therapy: Secondary | ICD-10-CM | POA: Insufficient documentation

## 2015-10-15 DIAGNOSIS — Z7982 Long term (current) use of aspirin: Secondary | ICD-10-CM | POA: Insufficient documentation

## 2015-10-15 DIAGNOSIS — I2781 Cor pulmonale (chronic): Secondary | ICD-10-CM | POA: Insufficient documentation

## 2015-10-15 DIAGNOSIS — J441 Chronic obstructive pulmonary disease with (acute) exacerbation: Secondary | ICD-10-CM | POA: Insufficient documentation

## 2015-10-15 LAB — BASIC METABOLIC PANEL
Anion gap: 8 (ref 5–15)
BUN: 20 mg/dL (ref 6–20)
CO2: 27 mmol/L (ref 22–32)
Calcium: 8.6 mg/dL — ABNORMAL LOW (ref 8.9–10.3)
Chloride: 102 mmol/L (ref 101–111)
Creatinine, Ser: 0.72 mg/dL (ref 0.44–1.00)
GFR calc Af Amer: >60 mL/min (ref >60)
GFR calc non Af Amer: >60 mL/min (ref >60)
Glucose, Bld: 117 mg/dL — ABNORMAL HIGH (ref 65–99)
Potassium: 3.7 mmol/L (ref 3.5–5.1)
Sodium: 137 mmol/L (ref 135–145)

## 2015-10-15 LAB — CBC WITH DIFFERENTIAL/PLATELET
Basophils Absolute: 0.1 10*3/uL (ref 0.0–0.1)
Basophils Relative: 1 %
Eosinophils Absolute: 0.4 10*3/uL (ref 0.0–0.7)
Eosinophils Relative: 4 %
HCT: 37.5 % (ref 36.0–46.0)
Hemoglobin: 11.9 g/dL — ABNORMAL LOW (ref 12.0–15.0)
Lymphocytes Relative: 19 %
Lymphs Abs: 1.8 10*3/uL (ref 0.7–4.0)
MCH: 29.2 pg (ref 26.0–34.0)
MCHC: 31.7 g/dL (ref 30.0–36.0)
MCV: 92.1 fL (ref 78.0–100.0)
Monocytes Absolute: 0.6 10*3/uL (ref 0.1–1.0)
Monocytes Relative: 7 %
Neutro Abs: 6.6 10*3/uL (ref 1.7–7.7)
Neutrophils Relative %: 69 %
Platelets: 273 10*3/uL (ref 150–400)
RBC: 4.07 MIL/uL (ref 3.87–5.11)
RDW: 15.3 % (ref 11.5–15.5)
WBC: 9.4 10*3/uL (ref 4.0–10.5)

## 2015-10-15 LAB — BRAIN NATRIURETIC PEPTIDE: B Natriuretic Peptide: 46 pg/mL (ref 0.0–100.0)

## 2015-10-15 MED ORDER — ALBUTEROL SULFATE (2.5 MG/3ML) 0.083% IN NEBU
2.5000 mg | INHALATION_SOLUTION | RESPIRATORY_TRACT | Status: DC | PRN
  Administered 2015-10-15: 2.5 mg via RESPIRATORY_TRACT
  Filled 2015-10-15: qty 3

## 2015-10-15 MED ORDER — PREDNISONE 20 MG PO TABS: 20.0000 mg | ORAL_TABLET | Freq: Two times a day (BID) | ORAL | Status: DC

## 2015-10-15 MED ORDER — POTASSIUM CHLORIDE ER 10 MEQ PO TBCR
10.0000 meq | EXTENDED_RELEASE_TABLET | Freq: Every day | ORAL | Status: DC
Start: 2015-10-15 — End: 2015-11-20

## 2015-10-15 MED ORDER — FUROSEMIDE 40 MG PO TABS
40.0000 mg | ORAL_TABLET | Freq: Every day | ORAL | Status: DC
  Administered 2015-10-15: 40 mg via ORAL
  Filled 2015-10-15: qty 1

## 2015-10-15 MED ORDER — DOXYCYCLINE HYCLATE 100 MG PO CAPS: 100.0000 mg | ORAL_CAPSULE | Freq: Two times a day (BID) | ORAL | Status: DC

## 2015-10-15 MED ORDER — FUROSEMIDE 20 MG PO TABS: 20.0000 mg | ORAL_TABLET | Freq: Every day | ORAL | Status: DC

## 2015-10-15 MED ORDER — PREDNISONE 50 MG PO TABS
60.0000 mg | ORAL_TABLET | Freq: Once | ORAL | Status: AC
  Administered 2015-10-15: 60 mg via ORAL
  Filled 2015-10-15: qty 1

## 2015-10-15 NOTE — ED Notes (Signed)
PT states understanding of care given and follow up instructions.  Ambulated from ED with significant other

## 2015-10-15 NOTE — Discharge Instructions (Signed)
Chronic Obstructive Pulmonary Disease Chronic obstructive pulmonary disease (COPD) is a common lung condition in which airflow from the lungs is limited. COPD is a general term that can be used to describe many different lung problems that limit airflow, including both chronic bronchitis and emphysema. If you have COPD, your lung function will probably never return to normal, but there are measures you can take to improve lung function and make yourself feel better. CAUSES   Smoking (common).  Exposure to secondhand smoke.  Genetic problems.  Chronic inflammatory lung diseases or recurrent infections. SYMPTOMS  Shortness of breath, especially with physical activity.  Deep, persistent (chronic) cough with a large amount of thick mucus.  Wheezing.  Rapid breaths (tachypnea).  Gray or bluish discoloration (cyanosis) of the skin, especially in your fingers, toes, or lips.  Fatigue.  Weight loss.  Frequent infections or episodes when breathing symptoms become much worse (exacerbations).  Chest tightness. DIAGNOSIS Your health care provider will take a medical history and perform a physical examination to diagnose COPD. Additional tests for COPD may include:  Lung (pulmonary) function tests.  Chest X-ray.  CT scan.  Blood tests. TREATMENT  Treatment for COPD may include:  Inhaler and nebulizer medicines. These help manage the symptoms of COPD and make your breathing more comfortable.  Supplemental oxygen. Supplemental oxygen is only helpful if you have a low oxygen level in your blood.  Exercise and physical activity. These are beneficial for nearly all people with COPD.  Lung surgery or transplant.  Nutrition therapy to gain weight, if you are underweight.  Pulmonary rehabilitation. This may involve working with a team of health care providers and specialists, such as respiratory, occupational, and physical therapists. HOME CARE INSTRUCTIONS  Take all medicines  (inhaled or pills) as directed by your health care provider.  Avoid over-the-counter medicines or cough syrups that dry up your airway (such as antihistamines) and slow down the elimination of secretions unless instructed otherwise by your health care provider.  If you are a smoker, the most important thing that you can do is stop smoking. Continuing to smoke will cause further lung damage and breathing trouble. Ask your health care provider for help with quitting smoking. He or she can direct you to community resources or hospitals that provide support.  Avoid exposure to irritants such as smoke, chemicals, and fumes that aggravate your breathing.  Use oxygen therapy and pulmonary rehabilitation if directed by your health care provider. If you require home oxygen therapy, ask your health care provider whether you should purchase a pulse oximeter to measure your oxygen level at home.  Avoid contact with individuals who have a contagious illness.  Avoid extreme temperature and humidity changes.  Eat healthy foods. Eating smaller, more frequent meals and resting before meals may help you maintain your strength.  Stay active, but balance activity with periods of rest. Exercise and physical activity will help you maintain your ability to do things you want to do.  Preventing infection and hospitalization is very important when you have COPD. Make sure to receive all the vaccines your health care provider recommends, especially the pneumococcal and influenza vaccines. Ask your health care provider whether you need a pneumonia vaccine.  Learn and use relaxation techniques to manage stress.  Learn and use controlled breathing techniques as directed by your health care provider. Controlled breathing techniques include:  Pursed lip breathing. Start by breathing in (inhaling) through your nose for 1 second. Then, purse your lips as if you were   going to whistle and breathe out (exhale) through the  pursed lips for 2 seconds.  Diaphragmatic breathing. Start by putting one hand on your abdomen just above your waist. Inhale slowly through your nose. The hand on your abdomen should move out. Then purse your lips and exhale slowly. You should be able to feel the hand on your abdomen moving in as you exhale.  Learn and use controlled coughing to clear mucus from your lungs. Controlled coughing is a series of short, progressive coughs. The steps of controlled coughing are: 1. Lean your head slightly forward. 2. Breathe in deeply using diaphragmatic breathing. 3. Try to hold your breath for 3 seconds. 4. Keep your mouth slightly open while coughing twice. 5. Spit any mucus out into a tissue. 6. Rest and repeat the steps once or twice as needed. SEEK MEDICAL CARE IF:  You are coughing up more mucus than usual.  There is a change in the color or thickness of your mucus.  Your breathing is more labored than usual.  Your breathing is faster than usual. SEEK IMMEDIATE MEDICAL CARE IF:  You have shortness of breath while you are resting.  You have shortness of breath that prevents you from:  Being able to talk.  Performing your usual physical activities.  You have chest pain lasting longer than 5 minutes.  Your skin color is more cyanotic than usual.  You measure low oxygen saturations for longer than 5 minutes with a pulse oximeter. MAKE SURE YOU:  Understand these instructions.  Will watch your condition.  Will get help right away if you are not doing well or get worse.   This information is not intended to replace advice given to you by your health care provider. Make sure you discuss any questions you have with your health care provider.   Document Released: 03/16/2005 Document Revised: 06/27/2014 Document Reviewed: 01/31/2013 Elsevier Interactive Patient Education 2016 [employer]  

## 2015-10-15 NOTE — ED Provider Notes (Signed)
CSN: [id number]     Arrival date & time 10/15/15  2059 History  By signing my name below, I, Tanda Rockers, attest that this documentation has been prepared under the direction and in the presence of Rolland Porter, MD. Electronically Signed: Tanda Rockers, ED Scribe. 10/15/2015. 10:11 PM.   Chief Complaint  Patient presents with  . Shortness of Breath  . Wheezing   The history is provided by the patient. No language interpreter was used.     HPI Comments: Marie West is a 60 y.o. female with PMHx COPD not on oxygen and HTN who presents to the Emergency Department complaining of gradual onset, constant, shortness of breath x 2 weeks. Pt also complains of wheezing, coughing, leg swelling, and sleep difficulty. Pt is unsure if she is waking up due to feeling like she cannot breathe. She was recently diagnosed with bronchitis about 2 weeks ago. She is a current everyday smoker. Denies fever or any other associated symptoms. Pt is not currently on a diuretic.   She also complains of intermittent right leg cramping that has been ongoing for some time. She reports that after the cramp comes in she has mild numbness to the area.    Past Medical History  Diagnosis Date  . MRSA infection   . Bronchitis   . Hypertension   . Hypertensive urgency   . Acute respiratory failure (HCC)   . COPD with exacerbation (HCC)   . Retinal detachment   . Hyperthyroidism   . Shortness of breath   . Pneumonia   . CHF (congestive heart failure) (HCC)   . Headache(784.0)   . Fibromyalgia   . Arthritis   . Sciatic leg pain    Past Surgical History  Procedure Laterality Date  . Knee surgery    . Tubal ligation     Family History  Problem Relation Age of Onset  . Diabetes Mother   . Heart disease Mother     died AMI, age 51  . Heart disease Brother   . Diabetes Sister    Social History  Substance Use Topics  . Smoking status: Current Some Day Smoker -- 0.50 packs/day for 36 years    Types:  Cigarettes  . Smokeless tobacco: None  . Alcohol Use: Yes     Comment: occ-wine   OB History    No data available     Review of Systems  Constitutional: Negative for fever, chills, diaphoresis, appetite change and fatigue.  HENT: Negative for mouth sores, sore throat and trouble swallowing.   Eyes: Negative for visual disturbance.  Respiratory: Positive for cough, shortness of breath and wheezing. Negative for chest tightness.   Cardiovascular: Positive for leg swelling. Negative for chest pain.  Gastrointestinal: Negative for nausea, vomiting, abdominal pain, diarrhea and abdominal distention.  Endocrine: Negative for polydipsia, polyphagia and polyuria.  Genitourinary: Negative for dysuria, frequency and hematuria.  Musculoskeletal: Negative for gait problem.  Skin: Negative for color change, pallor and rash.  Neurological: Negative for dizziness, syncope, light-headedness and headaches.  Hematological: Does not bruise/bleed easily.  Psychiatric/Behavioral: Positive for sleep disturbance. Negative for behavioral problems and confusion.   Allergies  Bee venom  Home Medications   Prior to Admission medications   Medication Sig Start Date End Date Taking? Authorizing Provider  albuterol (PROVENTIL HFA;VENTOLIN HFA) 108 (90 BASE) MCG/ACT inhaler Inhale 2 puffs into the lungs every 6 (six) hours as needed for wheezing or shortness of breath. 07/11/13  Yes Gwenyth Bender, NP  aspirin EC 81 MG tablet Take 81 mg by mouth daily.   Yes Historical Provider, MD  budesonide-formoterol (SYMBICORT) 160-4.5 MCG/ACT inhaler Inhale 2 puffs into the lungs 2 (two) times daily. 07/11/13  Yes Lesle ChrisKaren M Black, NP  ibuprofen (ADVIL,MOTRIN) 200 MG tablet Take 800 mg by mouth every 4 (four) hours as needed. For pain   Yes Historical Provider, MD  ipratropium-albuterol (DUONEB) 0.5-2.5 (3) MG/3ML SOLN Take 3 mLs by nebulization every 4 (four) hours as needed (wheezing). 09/24/15  Yes Lavera Guiseana Duo Liu, MD  amLODipine  (NORVASC) 10 MG tablet Take 10 mg by mouth daily.    Historical Provider, MD  dexamethasone (DECADRON) 4 MG tablet Take 1 tablet (4 mg total) by mouth 2 (two) times daily with a meal. Patient not taking: Reported on 10/15/2015 09/01/15   Ivery QualeHobson Bryant, PA-C  diclofenac (VOLTAREN) 75 MG EC tablet Take 1 tablet (75 mg total) by mouth 2 (two) times daily. Patient not taking: Reported on 10/15/2015 09/01/15   Ivery QualeHobson Bryant, PA-C  doxycycline (VIBRAMYCIN) 100 MG capsule Take 1 capsule (100 mg total) by mouth 2 (two) times daily. 10/15/15   Rolland PorterMark Brookelynne Dimperio, MD  DULoxetine (CYMBALTA) 60 MG capsule Take 60 mg by mouth daily.    Historical Provider, MD  furosemide (LASIX) 20 MG tablet Take 1 tablet (20 mg total) by mouth daily. 10/15/15   Rolland PorterMark Hennessey Cantrell, MD  levothyroxine (SYNTHROID, LEVOTHROID) 50 MCG tablet Take 50 mcg by mouth daily before breakfast.    Historical Provider, MD  lisinopril-hydrochlorothiazide (PRINZIDE,ZESTORETIC) 20-12.5 MG per tablet Take 1 tablet by mouth daily.    Historical Provider, MD  loratadine (CLARITIN) 10 MG tablet Take 10 mg by mouth daily.    Historical Provider, MD  metFORMIN (GLUCOPHAGE) 500 MG tablet Take 500 mg by mouth at bedtime.    Historical Provider, MD  methocarbamol (ROBAXIN) 500 MG tablet Take 1 tablet (500 mg total) by mouth 3 (three) times daily. Patient not taking: Reported on 10/15/2015 09/01/15   Ivery QualeHobson Bryant, PA-C  potassium chloride (K-DUR) 10 MEQ tablet Take 1 tablet (10 mEq total) by mouth daily. 10/15/15   Rolland PorterMark Ko Bardon, MD  predniSONE (DELTASONE) 20 MG tablet Take 1 tablet (20 mg total) by mouth 2 (two) times daily with a meal. 10/15/15   Rolland PorterMark Thayer Embleton, MD  rosuvastatin (CRESTOR) 10 MG tablet Take 10 mg by mouth daily.    Historical Provider, MD   BP 147/78 mmHg  Pulse 64  Temp(Src) 98.3 F (36.8 C) (Oral)  Resp 17  Ht 5\' 2"  (1.575 m)  Wt 315 lb (142.883 kg)  BMI 57.60 kg/m2  SpO2 100%   Physical Exam  Constitutional: She is oriented to person, place, and time. She  appears well-developed and well-nourished. No distress.  Morbidly obese  HENT:  Head: Normocephalic.  Eyes: Conjunctivae are normal. Pupils are equal, round, and reactive to light. No scleral icterus.  Neck: Normal range of motion. Neck supple. No thyromegaly present.  Cardiovascular: Normal rate and regular rhythm.  Exam reveals no gallop and no friction rub.   No murmur heard. Pulmonary/Chest: Effort normal. No respiratory distress. She has wheezes. She has no rales.  Globally diminished breath sounds with prolonged expiration.  No crackles.  No S4.   Abdominal: Soft. Bowel sounds are normal. She exhibits no distension. There is no tenderness. There is no rebound.  Musculoskeletal: Normal range of motion. She exhibits edema.  Neurological: She is alert and oriented to person, place, and time.  Skin: Skin is warm and  dry. No rash noted.  1+ symmetric lower extremity edema  Psychiatric: She has a normal mood and affect. Her behavior is normal.    ED Course  Procedures (including critical care time)  DIAGNOSTIC STUDIES: Oxygen Saturation is 93% on RA, adequate by my interpretation.    COORDINATION OF CARE: 10:09 PM-Discussed treatment plan with pt at bedside and pt agreed to plan.   Labs Review Labs Reviewed  CBC WITH DIFFERENTIAL/PLATELET - Abnormal; Notable for the following:    Hemoglobin 11.9 (*)    All other components within normal limits  BASIC METABOLIC PANEL - Abnormal; Notable for the following:    Glucose, Bld 117 (*)    Calcium 8.6 (*)    All other components within normal limits  BRAIN NATRIURETIC PEPTIDE    Imaging Review Dg Chest 2 View  10/15/2015  CLINICAL DATA:  Shortness of breath, wheezing for 3 weeks EXAM: CHEST  2 VIEW COMPARISON:  September 24, 2015 FINDINGS: The heart size and mediastinal contours are stable. The heart size is enlarged. There is mild atelectasis of lung bases. There is mild increased pulmonary interstitium bilaterally. There is no focal  pneumonia or pleural effusion. The visualized skeletal structures are stable. IMPRESSION: Cardiomegaly with mild chronic interstitial edema. Electronically Signed   By: Sherian Rein M.D.   On: 10/15/2015 21:39   I have personally reviewed and evaluated these images and lab results as part of my medical decision-making.   EKG Interpretation None      MDM   Final diagnoses:  COPD exacerbation (HCC)  Cor pulmonale (chronic) (HCC)    .  Clear lungs after treatment.  Plan albuterol, prednisone, potassium, Lasix, doxy.  I personally performed the services described in this documentation, which was scribed in my presence. The recorded information has been reviewed and is accurate. Rolland Porter, MD 10/15/15 [phone]

## 2015-10-15 NOTE — ED Notes (Signed)
Patient complaining of shortness of breath and wheezing starting "a couple of weeks ago when I was here for bronchitis. It just hasn't gotten any better."

## 2015-11-09 ENCOUNTER — Emergency Department (HOSPITAL_COMMUNITY): Payer: Self-pay

## 2015-11-09 ENCOUNTER — Encounter (HOSPITAL_COMMUNITY): Payer: Self-pay | Admitting: Emergency Medicine

## 2015-11-09 ENCOUNTER — Emergency Department (HOSPITAL_COMMUNITY)
Admission: EM | Admit: 2015-11-09 | Discharge: 2015-11-09 | Disposition: A | Discharge status: Home / Self Care | Payer: Self-pay | Attending: Emergency Medicine | Admitting: Emergency Medicine

## 2015-11-09 DIAGNOSIS — I10 Essential (primary) hypertension: Secondary | ICD-10-CM | POA: Insufficient documentation

## 2015-11-09 DIAGNOSIS — Z792 Long term (current) use of antibiotics: Secondary | ICD-10-CM | POA: Insufficient documentation

## 2015-11-09 DIAGNOSIS — Z8614 Personal history of Methicillin resistant Staphylococcus aureus infection: Secondary | ICD-10-CM | POA: Insufficient documentation

## 2015-11-09 DIAGNOSIS — Z7952 Long term (current) use of systemic steroids: Secondary | ICD-10-CM | POA: Insufficient documentation

## 2015-11-09 DIAGNOSIS — Z7984 Long term (current) use of oral hypoglycemic drugs: Secondary | ICD-10-CM | POA: Insufficient documentation

## 2015-11-09 DIAGNOSIS — M75101 Unspecified rotator cuff tear or rupture of right shoulder, not specified as traumatic: Secondary | ICD-10-CM | POA: Insufficient documentation

## 2015-11-09 DIAGNOSIS — M25311 Other instability, right shoulder: Secondary | ICD-10-CM

## 2015-11-09 DIAGNOSIS — J449 Chronic obstructive pulmonary disease, unspecified: Secondary | ICD-10-CM | POA: Insufficient documentation

## 2015-11-09 DIAGNOSIS — Z8669 Personal history of other diseases of the nervous system and sense organs: Secondary | ICD-10-CM | POA: Insufficient documentation

## 2015-11-09 DIAGNOSIS — I509 Heart failure, unspecified: Secondary | ICD-10-CM | POA: Insufficient documentation

## 2015-11-09 DIAGNOSIS — Z7951 Long term (current) use of inhaled steroids: Secondary | ICD-10-CM | POA: Insufficient documentation

## 2015-11-09 DIAGNOSIS — F17210 Nicotine dependence, cigarettes, uncomplicated: Secondary | ICD-10-CM | POA: Insufficient documentation

## 2015-11-09 DIAGNOSIS — Z79899 Other long term (current) drug therapy: Secondary | ICD-10-CM | POA: Insufficient documentation

## 2015-11-09 DIAGNOSIS — Z7982 Long term (current) use of aspirin: Secondary | ICD-10-CM | POA: Insufficient documentation

## 2015-11-09 DIAGNOSIS — E0590 Thyrotoxicosis, unspecified without thyrotoxic crisis or storm: Secondary | ICD-10-CM | POA: Insufficient documentation

## 2015-11-09 DIAGNOSIS — Z8701 Personal history of pneumonia (recurrent): Secondary | ICD-10-CM | POA: Insufficient documentation

## 2015-11-09 DIAGNOSIS — M25511 Pain in right shoulder: Secondary | ICD-10-CM

## 2015-11-09 DIAGNOSIS — M1990 Unspecified osteoarthritis, unspecified site: Secondary | ICD-10-CM | POA: Insufficient documentation

## 2015-11-09 MED ORDER — HYDROCODONE-ACETAMINOPHEN 5-325 MG PO TABS: 1.0000 | ORAL_TABLET | ORAL | Status: DC | PRN

## 2015-11-09 MED ORDER — NAPROXEN 500 MG PO TABS: 500.0000 mg | ORAL_TABLET | Freq: Two times a day (BID) | ORAL | Status: DC

## 2015-11-09 MED ORDER — HYDROCODONE-ACETAMINOPHEN 5-325 MG PO TABS
1.0000 | ORAL_TABLET | Freq: Once | ORAL | Status: AC
  Administered 2015-11-09: 1 via ORAL
  Filled 2015-11-09: qty 1

## 2015-11-09 MED ORDER — KETOROLAC TROMETHAMINE 60 MG/2ML IM SOLN
30.0000 mg | Freq: Once | INTRAMUSCULAR | Status: AC
  Administered 2015-11-09: 30 mg via INTRAMUSCULAR
  Filled 2015-11-09: qty 2

## 2015-11-09 NOTE — Discharge Instructions (Signed)
Please call Dr. Nilsa Nuttinglin's office to schedule a follow up appointment as soon as possible. In the meantime take medication as prescribed as needed for pain. Return to the ER for new or worsening symptoms.

## 2015-11-09 NOTE — ED Provider Notes (Signed)
CSN: [id number]     Arrival date & time 11/09/15  1446 History  By signing my name below, I, Marie West, attest that this documentation has been prepared under the direction and in the presence of Tayli Buch, PA-C. Electronically Signed: Octavia West, ED Scribe. 11/09/2015. 4:25 PM.    Chief Complaint  Patient presents with  . Shoulder Pain     The history is provided by the patient. No language interpreter was used.   HPI Comments: Marie West is a 60 y.o. female who has a PMhx of HTN, COPD, hyperthyroidism, CHF, fibromyalgia and sciatic leg pain presents to the Emergency Department complaining of intermittent, gradual worsening, moderate, burning right shoulder pain onset one week ago. Pt states she was walking the dog when he pulled her quickly and notes feeling an immediate burning sensation. Pt says her right shoulder locks up on her and and she is unable to move it due to increased pain. Pt has taken 800 mg of ibuprofen to alleviate her pain with no relief. 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 She denies any other injury.  Past Medical History  Diagnosis Date  . MRSA infection   . Bronchitis   . Hypertension   . Hypertensive urgency   . Acute respiratory failure (HCC)   . COPD with exacerbation (HCC)   . Retinal detachment   . Hyperthyroidism   . Shortness of breath   . Pneumonia   . CHF (congestive heart failure) (HCC)   . Headache(784.0)   . Fibromyalgia   . Arthritis   . Sciatic leg pain    Past Surgical History  Procedure Laterality Date  . Knee surgery    . Tubal ligation     Family History  Problem Relation Age of Onset  . Diabetes Mother   . Heart disease Mother     died AMI, age 33  . Heart disease Brother   . Diabetes Sister    Social History  Substance Use Topics  . Smoking status: Current Some Day Smoker -- 0.50 packs/day for 36 years    Types: Cigarettes  .  Smokeless tobacco: None  . Alcohol Use: Yes     Comment: occ-wine   OB History    No data available     Review of Systems  A complete 10 system review of systems was obtained and all systems are negative except as noted in the HPI and PMH.    Allergies  Bee venom  Home Medications   Prior to Admission medications   Medication Sig Start Date End Date Taking? Authorizing Provider  albuterol (PROVENTIL HFA;VENTOLIN HFA) 108 (90 BASE) MCG/ACT inhaler Inhale 2 puffs into the lungs every 6 (six) hours as needed for wheezing or shortness of breath. 07/11/13   Gwenyth Bender, NP  amLODipine (NORVASC) 10 MG tablet Take 10 mg by mouth daily.    Historical Provider, MD  aspirin EC 81 MG tablet Take 81 mg by mouth daily.    Historical Provider, MD  budesonide-formoterol (SYMBICORT) 160-4.5 MCG/ACT inhaler Inhale 2 puffs into the lungs 2 (two) times daily. 07/11/13   Gwenyth Bender, NP  dexamethasone (DECADRON) 4 MG tablet Take 1 tablet (4 mg total) by mouth 2 (two) times daily with a meal. Patient not taking: Reported on 10/15/2015 09/01/15   Ivery Quale, PA-C  diclofenac (VOLTAREN) 75 MG EC tablet Take 1 tablet (  75 mg total) by mouth 2 (two) times daily. Patient not taking: Reported on 10/15/2015 09/01/15   Ivery QualeHobson Bryant, PA-C  doxycycline (VIBRAMYCIN) 100 MG capsule Take 1 capsule (100 mg total) by mouth 2 (two) times daily. 10/15/15   Rolland PorterMark James, MD  DULoxetine (CYMBALTA) 60 MG capsule Take 60 mg by mouth daily.    Historical Provider, MD  furosemide (LASIX) 20 MG tablet Take 1 tablet (20 mg total) by mouth daily. 10/15/15   Rolland PorterMark James, MD  ibuprofen (ADVIL,MOTRIN) 200 MG tablet Take 800 mg by mouth every 4 (four) hours as needed. For pain    Historical Provider, MD  ipratropium-albuterol (DUONEB) 0.5-2.5 (3) MG/3ML SOLN Take 3 mLs by nebulization every 4 (four) hours as needed (wheezing). 09/24/15   Lavera Guiseana Duo Liu, MD  levothyroxine (SYNTHROID, LEVOTHROID) 50 MCG tablet Take 50 mcg by mouth daily before  breakfast.    Historical Provider, MD  lisinopril-hydrochlorothiazide (PRINZIDE,ZESTORETIC) 20-12.5 MG per tablet Take 1 tablet by mouth daily.    Historical Provider, MD  loratadine (CLARITIN) 10 MG tablet Take 10 mg by mouth daily.    Historical Provider, MD  metFORMIN (GLUCOPHAGE) 500 MG tablet Take 500 mg by mouth at bedtime.    Historical Provider, MD  methocarbamol (ROBAXIN) 500 MG tablet Take 1 tablet (500 mg total) by mouth 3 (three) times daily. Patient not taking: Reported on 10/15/2015 09/01/15   Ivery QualeHobson Bryant, PA-C  potassium chloride (K-DUR) 10 MEQ tablet Take 1 tablet (10 mEq total) by mouth daily. 10/15/15   Rolland PorterMark James, MD  predniSONE (DELTASONE) 20 MG tablet Take 1 tablet (20 mg total) by mouth 2 (two) times daily with a meal. 10/15/15   Rolland PorterMark James, MD  rosuvastatin (CRESTOR) 10 MG tablet Take 10 mg by mouth daily.    Historical Provider, MD   Triage vitals: BP 122/90 mmHg  Pulse 79  Temp(Src) 98.4 F (36.9 C) (Oral)  Resp 18  SpO2 100% Physical Exam  Constitutional: She is oriented to person, place, and time. She appears well-developed and well-nourished.  HENT:  Head: Normocephalic.  Eyes: EOM are normal.  Neck: Normal range of motion.  Pulmonary/Chest: Effort normal.  Abdominal: She exhibits no distension.  Musculoskeletal: Normal range of motion.  Holding right arm in adduction and internally rotated. Can  minimalyl externally rotate, can abduct to 90 degrees. shoulder diffusely tender, difficult to assess if there is a visible deformity due to body habitus   Neurological: She is alert and oriented to person, place, and time.  Psychiatric: She has a normal mood and affect.  Nursing note and vitals reviewed.   ED Course  Procedures  DIAGNOSTIC STUDIES: Oxygen Saturation is 100% on RA, normal by my interpretation.  COORDINATION OF CARE:  4:11 PM Discussed treatment plan with pt at bedside and pt agreed to plan.  Labs Review Labs Reviewed - No data to  display  Imaging Review Dg Shoulder Right  11/09/2015  CLINICAL DATA:  Right shoulder pain for 1-1/2 weeks EXAM: RIGHT SHOULDER - 2+ VIEW COMPARISON:  None. FINDINGS: No fracture. Humeral head appears somewhat lateral and superior which can be seen with posterior dislocation. Other possibilities include positioning FX or chronic rotator cuff tear. IMPRESSION: Cannot exclude posterior dislocation. Study limited without axillary view. Other possibilities include chronic rotator cuff tear.Consider CT in the acute setting if posterior dislocation is a concern. Otherwise, consider MRI to evaluate rotator cuff tendons. Electronically Signed   By: Esperanza Heiraymond  Rubner M.D.   On: 11/09/2015 16:01  Ct Shoulder Right Wo Contrast  11/09/2015  CLINICAL DATA:  Right shoulder pain and locking after pulling injury 1 week ago. EXAM: CT OF THE RIGHT SHOULDER WITHOUT CONTRAST TECHNIQUE: Multidetector CT imaging was performed according to the standard protocol. Multiplanar CT image reconstructions were also generated. COMPARISON:  Radiographs same date. FINDINGS: Study is mildly limited by body habitus. Bones: The humeral head is located. There is no evidence of acute fracture or dislocation. The subacromial space of the right shoulder is significantly narrowed, consistent with a chronic rotator cuff tear. Joint/cartilage: No evidence of large glenohumeral joint effusion or significant arthropathy. There are mild acromioclavicular degenerative changes. Ligaments: Not applicable for exam/indication. Tendon/muscles: As above, the subacromial space is narrowed. There is significant muscular atrophy of the subscapularis, supraspinatus and infraspinatus muscles, supporting the presence of chronic rotator cuff tear. Neurovascular/other soft tissues: No other significant findings. IMPRESSION: 1. No evidence of acute fracture or dislocation. The humeral head is located. 2. Chronic rotator cuff tear with narrowing of the subacromial space  of the right shoulder. Electronically Signed   By: Carey Bullocks M.D.   On: 11/09/2015 18:11   I have personally reviewed and evaluated these images and lab results as part of my medical decision-making.   EKG Interpretation None      MDM   Final diagnoses:  Right shoulder pain  Rotator cuff insufficiency, right    X-ray concerning for possible posterior dislocation. CT was obtained which reveals no fracture or dislocation. There is evidence of a chronic rotator cuff tear. Pt does endorse on reassessment that she has had issues with her right shoulder for years, but never this bad. Pain improved in the ED. Rx given for pain meds at home with instructions to f/u with ortho. She is requesting a shoulder sling. Sling was provided but I encouraged ROM as tolerated. ER return precautions given. I personally performed the services described in this documentation, which was scribed in my presence. The recorded information has been reviewed and is accurate.   Carlene Coria, PA-C 11/09/15 1910  Jacalyn Lefevre, MD 11/10/15 Burna Mortimer

## 2015-11-09 NOTE — ED Notes (Signed)
Pt to xray at this time.

## 2015-11-09 NOTE — ED Notes (Signed)
Pt to CT at this time.

## 2015-11-09 NOTE — ED Notes (Signed)
Pt reports right shoulder pain after her dog pulled on the leash to hard. Pt alert x4.

## 2015-11-17 ENCOUNTER — Encounter (HOSPITAL_COMMUNITY): Payer: Self-pay | Admitting: Emergency Medicine

## 2015-11-17 ENCOUNTER — Inpatient Hospital Stay (HOSPITAL_COMMUNITY)
Admission: EM | Admit: 2015-11-17 | Discharge: 2015-11-20 | DRG: 190 | Disposition: A | Discharge status: Home / Self Care | Payer: Self-pay | Attending: Internal Medicine | Admitting: Internal Medicine

## 2015-11-17 ENCOUNTER — Emergency Department (HOSPITAL_COMMUNITY): Payer: Self-pay

## 2015-11-17 DIAGNOSIS — Z79899 Other long term (current) drug therapy: Secondary | ICD-10-CM

## 2015-11-17 DIAGNOSIS — J441 Chronic obstructive pulmonary disease with (acute) exacerbation: Principal | ICD-10-CM | POA: Diagnosis present

## 2015-11-17 DIAGNOSIS — E119 Type 2 diabetes mellitus without complications: Secondary | ICD-10-CM

## 2015-11-17 DIAGNOSIS — E038 Other specified hypothyroidism: Secondary | ICD-10-CM

## 2015-11-17 DIAGNOSIS — I509 Heart failure, unspecified: Secondary | ICD-10-CM

## 2015-11-17 DIAGNOSIS — F17210 Nicotine dependence, cigarettes, uncomplicated: Secondary | ICD-10-CM | POA: Diagnosis present

## 2015-11-17 DIAGNOSIS — I110 Hypertensive heart disease with heart failure: Secondary | ICD-10-CM | POA: Diagnosis present

## 2015-11-17 DIAGNOSIS — Z7982 Long term (current) use of aspirin: Secondary | ICD-10-CM

## 2015-11-17 DIAGNOSIS — R0600 Dyspnea, unspecified: Secondary | ICD-10-CM | POA: Insufficient documentation

## 2015-11-17 DIAGNOSIS — Z833 Family history of diabetes mellitus: Secondary | ICD-10-CM

## 2015-11-17 DIAGNOSIS — Z6841 Body Mass Index (BMI) 40.0 and over, adult: Secondary | ICD-10-CM

## 2015-11-17 DIAGNOSIS — Z8614 Personal history of Methicillin resistant Staphylococcus aureus infection: Secondary | ICD-10-CM

## 2015-11-17 DIAGNOSIS — E039 Hypothyroidism, unspecified: Secondary | ICD-10-CM | POA: Diagnosis present

## 2015-11-17 DIAGNOSIS — Z7984 Long term (current) use of oral hypoglycemic drugs: Secondary | ICD-10-CM

## 2015-11-17 DIAGNOSIS — Z7951 Long term (current) use of inhaled steroids: Secondary | ICD-10-CM

## 2015-11-17 DIAGNOSIS — J9601 Acute respiratory failure with hypoxia: Secondary | ICD-10-CM | POA: Diagnosis present

## 2015-11-17 DIAGNOSIS — Z720 Tobacco use: Secondary | ICD-10-CM | POA: Diagnosis present

## 2015-11-17 DIAGNOSIS — I5032 Chronic diastolic (congestive) heart failure: Secondary | ICD-10-CM | POA: Diagnosis present

## 2015-11-17 DIAGNOSIS — I10 Essential (primary) hypertension: Secondary | ICD-10-CM | POA: Diagnosis present

## 2015-11-17 DIAGNOSIS — Z8249 Family history of ischemic heart disease and other diseases of the circulatory system: Secondary | ICD-10-CM

## 2015-11-17 DIAGNOSIS — F329 Major depressive disorder, single episode, unspecified: Secondary | ICD-10-CM | POA: Diagnosis present

## 2015-11-17 DIAGNOSIS — M797 Fibromyalgia: Secondary | ICD-10-CM | POA: Diagnosis present

## 2015-11-17 DIAGNOSIS — F32A Depression, unspecified: Secondary | ICD-10-CM | POA: Diagnosis present

## 2015-11-17 DIAGNOSIS — R0902 Hypoxemia: Secondary | ICD-10-CM | POA: Insufficient documentation

## 2015-11-17 LAB — GLUCOSE, CAPILLARY
Glucose-Capillary: 96 mg/dL (ref 65–99)
Glucose-Capillary: 200 mg/dL — ABNORMAL HIGH (ref 65–99)

## 2015-11-17 LAB — CBC WITH DIFFERENTIAL/PLATELET
Basophils Absolute: 0 10*3/uL (ref 0.0–0.1)
Basophils Relative: 0 %
Eosinophils Absolute: 0.4 10*3/uL (ref 0.0–0.7)
Eosinophils Relative: 5 %
HCT: 42.6 % (ref 36.0–46.0)
Hemoglobin: 12.8 g/dL (ref 12.0–15.0)
Lymphocytes Relative: 19 %
Lymphs Abs: 1.4 10*3/uL (ref 0.7–4.0)
MCH: 28.4 pg (ref 26.0–34.0)
MCHC: 30 g/dL (ref 30.0–36.0)
MCV: 94.5 fL (ref 78.0–100.0)
Monocytes Absolute: 0.4 10*3/uL (ref 0.1–1.0)
Monocytes Relative: 6 %
Neutro Abs: 4.9 10*3/uL (ref 1.7–7.7)
Neutrophils Relative %: 70 %
Platelets: 242 10*3/uL (ref 150–400)
RBC: 4.51 MIL/uL (ref 3.87–5.11)
RDW: 15.9 % — ABNORMAL HIGH (ref 11.5–15.5)
WBC: 7 10*3/uL (ref 4.0–10.5)

## 2015-11-17 LAB — COMPREHENSIVE METABOLIC PANEL
ALT: 49 U/L (ref 14–54)
AST: 24 U/L (ref 15–41)
Albumin: 3.9 g/dL (ref 3.5–5.0)
Alkaline Phosphatase: 63 U/L (ref 38–126)
Anion gap: 6 (ref 5–15)
BUN: 15 mg/dL (ref 6–20)
CO2: 35 mmol/L — ABNORMAL HIGH (ref 22–32)
Calcium: 8.9 mg/dL (ref 8.9–10.3)
Chloride: 100 mmol/L — ABNORMAL LOW (ref 101–111)
Creatinine, Ser: 0.63 mg/dL (ref 0.44–1.00)
GFR calc Af Amer: >60 mL/min (ref >60)
GFR calc non Af Amer: >60 mL/min (ref >60)
Glucose, Bld: 95 mg/dL (ref 65–99)
Potassium: 4 mmol/L (ref 3.5–5.1)
Sodium: 141 mmol/L (ref 135–145)
Total Bilirubin: 0.5 mg/dL (ref 0.3–1.2)
Total Protein: 7 g/dL (ref 6.5–8.1)

## 2015-11-17 LAB — TROPONIN I: Troponin I: 0.03 ng/mL (ref <0.031)

## 2015-11-17 LAB — BRAIN NATRIURETIC PEPTIDE: B Natriuretic Peptide: 100 pg/mL (ref 0.0–100.0)

## 2015-11-17 MED ORDER — LORATADINE 10 MG PO TABS
10.0000 mg | ORAL_TABLET | Freq: Every day | ORAL | Status: DC
  Administered 2015-11-17 – 2015-11-20 (×4): 10 mg via ORAL
  Filled 2015-11-17 (×4): qty 1

## 2015-11-17 MED ORDER — ENOXAPARIN SODIUM 40 MG/0.4ML ~~LOC~~ SOLN: 40.0000 mg | SUBCUTANEOUS | Status: DC

## 2015-11-17 MED ORDER — IPRATROPIUM BROMIDE 0.02 % IN SOLN
0.5000 mg | Freq: Once | RESPIRATORY_TRACT | Status: AC
  Administered 2015-11-17: 0.5 mg via RESPIRATORY_TRACT
  Filled 2015-11-17: qty 2.5

## 2015-11-17 MED ORDER — IPRATROPIUM-ALBUTEROL 0.5-2.5 (3) MG/3ML IN SOLN
3.0000 mL | Freq: Once | RESPIRATORY_TRACT | Status: AC
  Administered 2015-11-17: 3 mL via RESPIRATORY_TRACT
  Filled 2015-11-17: qty 3

## 2015-11-17 MED ORDER — FUROSEMIDE 10 MG/ML IJ SOLN
40.0000 mg | Freq: Once | INTRAMUSCULAR | Status: AC
  Administered 2015-11-17: 40 mg via INTRAVENOUS
  Filled 2015-11-17: qty 4

## 2015-11-17 MED ORDER — ROSUVASTATIN CALCIUM 10 MG PO TABS
10.0000 mg | ORAL_TABLET | Freq: Every day | ORAL | Status: DC
  Administered 2015-11-17 – 2015-11-20 (×4): 10 mg via ORAL
  Filled 2015-11-17 (×4): qty 1

## 2015-11-17 MED ORDER — ENOXAPARIN SODIUM 80 MG/0.8ML ~~LOC~~ SOLN
70.0000 mg | SUBCUTANEOUS | Status: DC
  Administered 2015-11-17 – 2015-11-19 (×3): 70 mg via SUBCUTANEOUS
  Filled 2015-11-17 (×3): qty 0.8

## 2015-11-17 MED ORDER — SODIUM CHLORIDE 0.9 % IV SOLN: 250.0000 mL | INTRAVENOUS | Status: DC | PRN

## 2015-11-17 MED ORDER — ACETAMINOPHEN 325 MG PO TABS
650.0000 mg | ORAL_TABLET | ORAL | Status: DC | PRN
  Administered 2015-11-17 – 2015-11-20 (×2): 650 mg via ORAL
  Filled 2015-11-17 (×2): qty 2

## 2015-11-17 MED ORDER — SODIUM CHLORIDE 0.9% FLUSH: 3.0000 mL | INTRAVENOUS | Status: DC | PRN

## 2015-11-17 MED ORDER — METHYLPREDNISOLONE SODIUM SUCC 125 MG IJ SOLR
125.0000 mg | Freq: Once | INTRAMUSCULAR | Status: AC
  Administered 2015-11-17: 125 mg via INTRAVENOUS
  Filled 2015-11-17: qty 2

## 2015-11-17 MED ORDER — NICOTINE 14 MG/24HR TD PT24
14.0000 mg | MEDICATED_PATCH | Freq: Every day | TRANSDERMAL | Status: DC
  Administered 2015-11-17 – 2015-11-20 (×4): 14 mg via TRANSDERMAL
  Filled 2015-11-17 (×4): qty 1

## 2015-11-17 MED ORDER — LISINOPRIL 10 MG PO TABS
20.0000 mg | ORAL_TABLET | Freq: Every day | ORAL | Status: DC
  Administered 2015-11-17 – 2015-11-20 (×4): 20 mg via ORAL
  Filled 2015-11-17 (×4): qty 2

## 2015-11-17 MED ORDER — ONDANSETRON HCL 4 MG/2ML IJ SOLN
4.0000 mg | Freq: Four times a day (QID) | INTRAMUSCULAR | Status: DC | PRN
  Administered 2015-11-19: 4 mg via INTRAVENOUS
  Filled 2015-11-17: qty 2

## 2015-11-17 MED ORDER — LISINOPRIL-HYDROCHLOROTHIAZIDE 20-12.5 MG PO TABS: 1.0000 | ORAL_TABLET | Freq: Every day | ORAL | Status: DC

## 2015-11-17 MED ORDER — AMLODIPINE BESYLATE 5 MG PO TABS
10.0000 mg | ORAL_TABLET | Freq: Every day | ORAL | Status: DC
  Administered 2015-11-17 – 2015-11-20 (×4): 10 mg via ORAL
  Filled 2015-11-17 (×4): qty 2

## 2015-11-17 MED ORDER — ALBUTEROL (5 MG/ML) CONTINUOUS INHALATION SOLN
10.0000 mg/h | INHALATION_SOLUTION | Freq: Once | RESPIRATORY_TRACT | Status: AC
  Administered 2015-11-17: 10 mg/h via RESPIRATORY_TRACT
  Filled 2015-11-17: qty 20

## 2015-11-17 MED ORDER — FUROSEMIDE 10 MG/ML IJ SOLN: 20.0000 mg | Freq: Two times a day (BID) | INTRAMUSCULAR | Status: DC

## 2015-11-17 MED ORDER — LEVOTHYROXINE SODIUM 50 MCG PO TABS
50.0000 ug | ORAL_TABLET | Freq: Every day | ORAL | Status: DC
  Administered 2015-11-18 – 2015-11-20 (×3): 50 ug via ORAL
  Filled 2015-11-17 (×3): qty 1

## 2015-11-17 MED ORDER — MOMETASONE FURO-FORMOTEROL FUM 200-5 MCG/ACT IN AERO
2.0000 | INHALATION_SPRAY | Freq: Two times a day (BID) | RESPIRATORY_TRACT | Status: DC
  Administered 2015-11-17 – 2015-11-20 (×6): 2 via RESPIRATORY_TRACT
  Filled 2015-11-17: qty 8.8

## 2015-11-17 MED ORDER — SODIUM CHLORIDE 0.9% FLUSH
3.0000 mL | Freq: Two times a day (BID) | INTRAVENOUS | Status: DC
  Administered 2015-11-17 – 2015-11-20 (×5): 3 mL via INTRAVENOUS

## 2015-11-17 MED ORDER — IPRATROPIUM-ALBUTEROL 0.5-2.5 (3) MG/3ML IN SOLN: 3.0000 mL | RESPIRATORY_TRACT | Status: DC | PRN

## 2015-11-17 MED ORDER — HYDROCHLOROTHIAZIDE 12.5 MG PO CAPS
12.5000 mg | ORAL_CAPSULE | Freq: Every day | ORAL | Status: DC
  Administered 2015-11-17 – 2015-11-20 (×4): 12.5 mg via ORAL
  Filled 2015-11-17 (×4): qty 1

## 2015-11-17 MED ORDER — INSULIN ASPART 100 UNIT/ML ~~LOC~~ SOLN
0.0000 [IU] | Freq: Three times a day (TID) | SUBCUTANEOUS | Status: DC
  Administered 2015-11-18: 3 [IU] via SUBCUTANEOUS
  Administered 2015-11-18 – 2015-11-19 (×4): 2 [IU] via SUBCUTANEOUS
  Administered 2015-11-20: 3 [IU] via SUBCUTANEOUS
  Administered 2015-11-20 (×2): 2 [IU] via SUBCUTANEOUS

## 2015-11-17 MED ORDER — METHYLPREDNISOLONE SODIUM SUCC 125 MG IJ SOLR
60.0000 mg | Freq: Four times a day (QID) | INTRAMUSCULAR | Status: DC
  Administered 2015-11-17 – 2015-11-18 (×4): 60 mg via INTRAVENOUS
  Filled 2015-11-17 (×4): qty 2

## 2015-11-17 MED ORDER — DULOXETINE HCL 60 MG PO CPEP
60.0000 mg | ORAL_CAPSULE | Freq: Every day | ORAL | Status: DC
  Administered 2015-11-17 – 2015-11-20 (×4): 60 mg via ORAL
  Filled 2015-11-17 (×4): qty 1

## 2015-11-17 MED ORDER — ASPIRIN EC 81 MG PO TBEC
81.0000 mg | DELAYED_RELEASE_TABLET | Freq: Every day | ORAL | Status: DC
  Administered 2015-11-17 – 2015-11-20 (×4): 81 mg via ORAL
  Filled 2015-11-17 (×4): qty 1

## 2015-11-17 NOTE — ED Notes (Signed)
Pt reports increase in SOB and wheezing. States she has a hx of asthma and COPD. Pt also states she has been out of her medications. Does not wear oxygen at home.

## 2015-11-17 NOTE — H&P (Signed)
History and Physical    Marie West WUJ:[id number] DOB: Jun 03, 1956 DOA: 11/17/2015  PCP: Bobbye Riggs, NP   Patient coming from: Home  Chief Complaint: Dyspnea, wheezing   HPI: Marie West is a 60 y.o. female with medical history significant for COPD, hypertension, type 2 diabetes mellitus, depression, hypothyroidism, and obesity with BMI 58 presents to the ED with dyspnea at rest and wheezing. Patient was seen in the ED approximately one month ago for similar symptoms and was discharged home with antibiotics, steroid burst, and nebulized breathing treatments for COPD exacerbation but reports that she never seemed to make a full recovery from that episode. She denies any fevers, chills, long distance travel, or sick contacts. There's been mild increase in her chronic cough but without significant sputum production. There has been no chest pain or palpitations associated with this, but the patient reports progressive bilateral lower extremity swelling. She has been taking Lasix 20 mg daily, but with no appreciable change in her respiratory symptoms or swelling. Patient continues to abuse tobacco, reporting that she has recently switched from cigarettes to vaper pen.   ED Course: Upon arrival to the ED, patient is found to be saturating 88% on room air, afebrile, and with remaining vital signs stable. EKG features a sinus rhythm and troponin is within the normal limits. Chest x-ray demonstrates increased vascular pedicle width and diffuse bilateral airspace opacities suggestive of CHF. Chemistry panel features a bicarbonate of 35 but is otherwise unremarkable. CBC returned normal. Patient was treated with DuoNeb's, 125 mg IV Solu-Medrol, and 40 mg IV Lasix in the emergency department. Her O2 saturations normalized with the administration of 2 L/m supplemental oxygen and she remained hemodynamically stable. She'll be admitted to the telemetry unit for ongoing evaluation and management of acute  hypoxic respiratory failure suspected to be multifactorial with contributions from COPD, morbid obesity, and possible CHF.  Review of Systems:  All other systems reviewed and apart from HPI, are negative.  Past Medical History  Diagnosis Date  . MRSA infection   . Bronchitis   . Hypertension   . Hypertensive urgency   . Acute respiratory failure (HCC)   . COPD with exacerbation (HCC)   . Retinal detachment   . Hyperthyroidism   . Shortness of breath   . Pneumonia   . Headache(784.0)   . Fibromyalgia   . Arthritis   . Sciatic leg pain   . CHF (congestive heart failure) [facility])     Past Surgical History  Procedure Laterality Date  . Knee surgery    . Tubal ligation       reports that she has been smoking Cigarettes.  She has a 18 pack-year smoking history. She does not have any smokeless tobacco history on file. She reports that she drinks alcohol. She reports that she does not use illicit drugs.  Allergies  Allergen Reactions  . Bee Venom Swelling    Family History  Problem Relation Age of Onset  . Diabetes Mother   . Heart disease Mother     died AMI, age 61  . Heart disease Brother   . Diabetes Sister      Prior to Admission medications   Medication Sig Start Date End Date Taking? Authorizing Provider  albuterol (PROVENTIL HFA;VENTOLIN HFA) 108 (90 BASE) MCG/ACT inhaler Inhale 2 puffs into the lungs every 6 (six) hours as needed for wheezing or shortness of breath. 07/11/13  Yes Gwenyth Bender, NP  budesonide-formoterol (SYMBICORT) 160-4.5 MCG/ACT inhaler Inhale 2  puffs into the lungs 2 (two) times daily. 07/11/13  Yes Lesle ChrisKaren M Black, NP  furosemide (LASIX) 20 MG tablet Take 1 tablet (20 mg total) by mouth daily. 10/15/15  Yes Rolland PorterMark James, MD  ipratropium-albuterol (DUONEB) 0.5-2.5 (3) MG/3ML SOLN Take 3 mLs by nebulization every 4 (four) hours as needed (wheezing). 09/24/15  Yes Lavera Guiseana Duo Liu, MD  loratadine (CLARITIN) 10 MG tablet Take 10 mg by mouth daily.   Yes  Historical Provider, MD  naproxen (NAPROSYN) 500 MG tablet Take 1 tablet (500 mg total) by mouth 2 (two) times daily. 11/09/15  Yes Ace GinsSerena Y Sam, PA-C  amLODipine (NORVASC) 10 MG tablet Take 10 mg by mouth daily. Reported on 11/17/2015    Historical Provider, MD  aspirin EC 81 MG tablet Take 81 mg by mouth daily. Reported on 11/17/2015    Historical Provider, MD  dexamethasone (DECADRON) 4 MG tablet Take 1 tablet (4 mg total) by mouth 2 (two) times daily with a meal. Patient not taking: Reported on 10/15/2015 09/01/15   Ivery QualeHobson Bryant, PA-C  diclofenac (VOLTAREN) 75 MG EC tablet Take 1 tablet (75 mg total) by mouth 2 (two) times daily. Patient not taking: Reported on 10/15/2015 09/01/15   Ivery QualeHobson Bryant, PA-C  doxycycline (VIBRAMYCIN) 100 MG capsule Take 1 capsule (100 mg total) by mouth 2 (two) times daily. Patient not taking: Reported on 11/17/2015 10/15/15   Rolland PorterMark James, MD  DULoxetine (CYMBALTA) 60 MG capsule Take 60 mg by mouth daily. Reported on 11/17/2015    Historical Provider, MD  HYDROcodone-acetaminophen (NORCO/VICODIN) 5-325 MG tablet Take 1 tablet by mouth every 4 (four) hours as needed. Patient not taking: Reported on 11/17/2015 11/09/15   Ace GinsSerena Y Sam, PA-C  ibuprofen (ADVIL,MOTRIN) 200 MG tablet Take 800 mg by mouth every 4 (four) hours as needed. For pain    Historical Provider, MD  levothyroxine (SYNTHROID, LEVOTHROID) 50 MCG tablet Take 50 mcg by mouth daily before breakfast. Reported on 11/17/2015    Historical Provider, MD  lisinopril-hydrochlorothiazide (PRINZIDE,ZESTORETIC) 20-12.5 MG per tablet Take 1 tablet by mouth daily. Reported on 11/17/2015    Historical Provider, MD  metFORMIN (GLUCOPHAGE) 500 MG tablet Take 500 mg by mouth at bedtime. Reported on 11/17/2015    Historical Provider, MD  methocarbamol (ROBAXIN) 500 MG tablet Take 1 tablet (500 mg total) by mouth 3 (three) times daily. Patient not taking: Reported on 10/15/2015 09/01/15   Ivery QualeHobson Bryant, PA-C  potassium chloride (K-DUR) 10  MEQ tablet Take 1 tablet (10 mEq total) by mouth daily. Patient not taking: Reported on 11/17/2015 10/15/15   Rolland PorterMark James, MD  predniSONE (DELTASONE) 20 MG tablet Take 1 tablet (20 mg total) by mouth 2 (two) times daily with a meal. Patient not taking: Reported on 11/17/2015 10/15/15   Rolland PorterMark James, MD  rosuvastatin (CRESTOR) 10 MG tablet Take 10 mg by mouth daily. Reported on 11/17/2015    Historical Provider, MD    Physical Exam: Filed Vitals:   11/17/15 1430 11/17/15 1445 11/17/15 1500 11/17/15 1530  BP: 130/61  148/97 124/73  Pulse:  74 84 76  Temp:      TempSrc:      Resp:      Height:      Weight:      SpO2:  100% 100% 93%    Constitutional: Mild respiratory distress with accessory muscle use; appearing stated age Eyes: PERTLA, lids and conjunctivae normal ENMT: Mucous membranes are moist. Posterior pharynx clear of any exudate or lesions.   Neck:  normal, supple, no masses, no thyromegaly Respiratory:  Increased work of breathing, mildly diminished, audible wheezes. No rhonchi appreciated.  Cardiovascular: S1 & S2 heard, regular rate and rhythm. 2+ pitting edema b/l LEs. No carotid bruits. Abdomen: No distension, no tenderness, no masses palpated. Bowel sounds normal.  Musculoskeletal: no clubbing / cyanosis. No joint deformity upper and lower extremities. Normal muscle tone.  Skin: no significant rashes, lesions, ulcers. Warm, dry, well-perfused. Neurologic: CN 2-12 grossly intact. Sensation intact, DTR normal. Strength 5/5 in all 4 limbs.  Psychiatric: Normal judgment and insight. Alert and oriented x 3. Normal mood and affect.     Labs on Admission: I have personally reviewed following labs and imaging studies  CBC:  Recent Labs Lab 11/17/15 1310  WBC 7.0  NEUTROABS 4.9  HGB 12.8  HCT 42.6  MCV 94.5  PLT 242   Basic Metabolic Panel:  Recent Labs Lab 11/17/15 1310  NA 141  K 4.0  CL 100*  CO2 35*  GLUCOSE 95  BUN 15  CREATININE 0.63  CALCIUM 8.9    GFR: Estimated Creatinine Clearance: 102.9 mL/min (by C-G formula based on Cr of 0.63). Liver Function Tests:  Recent Labs Lab 11/17/15 1310  AST 24  ALT 49  ALKPHOS 63  BILITOT 0.5  PROT 7.0  ALBUMIN 3.9   No results for input(s): LIPASE, AMYLASE in the last 168 hours. No results for input(s): AMMONIA in the last 168 hours. Coagulation Profile: No results for input(s): INR, PROTIME in the last 168 hours. Cardiac Enzymes:  Recent Labs Lab 11/17/15 1310  TROPONINI 0.03   BNP (last 3 results) No results for input(s): PROBNP in the last 8760 hours. HbA1C: No results for input(s): HGBA1C in the last 72 hours. CBG: No results for input(s): GLUCAP in the last 168 hours. Lipid Profile: No results for input(s): CHOL, HDL, LDLCALC, TRIG, CHOLHDL, LDLDIRECT in the last 72 hours. Thyroid Function Tests: No results for input(s): TSH, T4TOTAL, FREET4, T3FREE, THYROIDAB in the last 72 hours. Anemia Panel: No results for input(s): VITAMINB12, FOLATE, FERRITIN, TIBC, IRON, RETICCTPCT in the last 72 hours. Urine analysis:    Component Value Date/Time   COLORURINE YELLOW 06/01/2012 1919   APPEARANCEUR CLEAR 06/01/2012 1919   LABSPEC 1.025 06/01/2012 1919   PHURINE 6.5 06/01/2012 1919   GLUCOSEU NEGATIVE 06/01/2012 1919   HGBUR NEGATIVE 06/01/2012 1919   BILIRUBINUR NEGATIVE 06/01/2012 1919   KETONESUR NEGATIVE 06/01/2012 1919   PROTEINUR TRACE* 06/01/2012 1919   UROBILINOGEN 0.2 06/01/2012 1919   NITRITE NEGATIVE 06/01/2012 1919   LEUKOCYTESUR NEGATIVE 06/01/2012 1919   Sepsis Labs: (procalcitonin:4,lacticidven:4) )No results found for this or any previous visit (from the past 240 hour(s)).   Radiological Exams on Admission: Dg Chest 2 View  11/17/2015  CLINICAL DATA:  Increasing shortness of breath since Saturday. Hypoxia. EXAM: CHEST  2 VIEW COMPARISON:  10/15/2015 FINDINGS: Chronic cardiopericardial enlargement. There is vascular pedicle widening and diffuse  interstitial opacity. No effusion or pneumothorax. No acute osseous finding. IMPRESSION: CHF pattern Electronically Signed   By: Marnee Spring M.D.   On: 11/17/2015 13:59    EKG: Independently reviewed. Sinus rhythm   Assessment/Plan  1. Acute hypoxic respiratory failure - Likely multifactorial; suspect COPD exacerbation to be the primary problem, with obesity, and possibly CHF contributing  - Saturating mid-80s on rm air on arrival; does not use supplemental O2 at baseline  - CXR on admission suggestive of CHF; no infectious s/s  - Treated with nebs, steroids, and Lasix in  ED  - Further management as below   2. COPD with acute exacerbation  - No infectious s/s  - Continue DuoNeb q4h prn, Solu-Medrol 60 mg IV q6h  - Continue home ICS/LABA with Dulera while in hospital  - Continuous pulse oximetry, wean supplemental O2 as tolerated  - Smoking cessation advised   3. ?Acute CHF  - Pt reports increased b/l LE edema over the same interval as respiratory status has worsened  - TTE (06/01/12) with EF 55-60%, mild concentric hypertrophy, no significant valvular disease, no wall motion abnormality  - Has been managed with Lasix 20 mg daily at home  - BNP only 100 on admission - Update echo to help clarify; TTE ordered    4. Hypertension - At-goal currently  - Managed with lisinopril, HCTZ, and Norvasc at home  - 40 mg IV Lasix given in ED  - Continue current management   5. Type II DM  - A1c was 5.6% in December 2013, suggesting excellent control (or absence of DM) at that time  - Managed at home with metformin; will hold metformin while in hospital  - Check CBG with meals and qHS  - Low-intensity SSI, adjust prn  - Update A1c, ordered   6. Hypothyroidism  - Appears stable  - Continue current-dose Synthroid   7. Depression   - Stable, pt denies SI, HI, or hallucinations  - Continue current-dose Cymbalta    8. Tobacco abuse  - Counseled toward cessation  - RN asked to  provide smoking cessation information prior to discharge  - Nicotine patch ordered at pt request   DVT prophylaxis: sq Lovenox  Code Status: Full  Family Communication: Discussed with patient  Disposition Plan: Admit to telemetry   Consults called: None Admission status: Inpatient    Briscoe Deutscher, MD [facility] Pager [phone]  If 7PM-7AM, please contact night-coverage [email or web address] Password [email or web address]  11/17/2015, 4:14 PM

## 2015-11-17 NOTE — ED Provider Notes (Signed)
CSN: [id number]     Arrival date & time 11/17/15  1246 History   First MD Initiated Contact with Patient 11/17/15 1305     Chief Complaint  Patient presents with  . Shortness of Breath     (Consider location/radiation/quality/duration/timing/severity/associated sxs/prior Treatment) HPI Comments: 60-year-old female with history of COPD, tobacco abuse, pleural effusion presents with worsening shortness of breath and cough the past 3 days. This feels similar to her COPD history. Patient denies classic blood clot risk factors. No unilateral leg swelling. Symptoms intermittent. Patient has been out of her medications. Patient is not on home oxygen.  Patient is a 60 y.o. female presenting with shortness of breath. The history is provided by the patient.  Shortness of Breath Associated symptoms: cough   Associated symptoms: no abdominal pain, no chest pain, no fever, no headaches, no neck pain, no rash and no vomiting     Past Medical History  Diagnosis Date  . MRSA infection   . Bronchitis   . Hypertension   . Hypertensive urgency   . Acute respiratory failure (HCC)   . COPD with exacerbation (HCC)   . Retinal detachment   . Hyperthyroidism   . Shortness of breath   . Pneumonia   . CHF (congestive heart failure) (HCC)   . Headache(784.0)   . Fibromyalgia   . Arthritis   . Sciatic leg pain    Past Surgical History  Procedure Laterality Date  . Knee surgery    . Tubal ligation     Family History  Problem Relation Age of Onset  . Diabetes Mother   . Heart disease Mother     died AMI, age 61  . Heart disease Brother   . Diabetes Sister    Social History  Substance Use Topics  . Smoking status: Current Some Day Smoker -- 0.50 packs/day for 36 years    Types: Cigarettes  . Smokeless tobacco: None  . Alcohol Use: Yes     Comment: occ-wine   OB History    No data available     Review of Systems  Constitutional: Positive for fatigue and unexpected weight change. Negative  for fever and chills.  HENT: Negative for congestion.   Eyes: Negative for visual disturbance.  Respiratory: Positive for cough and shortness of breath.   Cardiovascular: Negative for chest pain.  Gastrointestinal: Negative for vomiting and abdominal pain.  Genitourinary: Negative for dysuria and flank pain.  Musculoskeletal: Negative for back pain, neck pain and neck stiffness.  Skin: Negative for rash.  Neurological: Negative for light-headedness and headaches.      Allergies  Bee venom  Home Medications   Prior to Admission medications   Medication Sig Start Date End Date Taking? Authorizing Provider  albuterol (PROVENTIL HFA;VENTOLIN HFA) 108 (90 BASE) MCG/ACT inhaler Inhale 2 puffs into the lungs every 6 (six) hours as needed for wheezing or shortness of breath. 07/11/13  Yes Lesle ChrisKaren M Black, NP  budesonide-formoterol (SYMBICORT) 160-4.5 MCG/ACT inhaler Inhale 2 puffs into the lungs 2 (two) times daily. 07/11/13  Yes Lesle ChrisKaren M Black, NP  furosemide (LASIX) 20 MG tablet Take 1 tablet (20 mg total) by mouth daily. 10/15/15  Yes Rolland PorterMark James, MD  ipratropium-albuterol (DUONEB) 0.5-2.5 (3) MG/3ML SOLN Take 3 mLs by nebulization every 4 (four) hours as needed (wheezing). 09/24/15  Yes Lavera Guiseana Duo Liu, MD  loratadine (CLARITIN) 10 MG tablet Take 10 mg by mouth daily.   Yes Historical Provider, MD  naproxen (NAPROSYN) 500 MG tablet Take  1 tablet (500 mg total) by mouth 2 (two) times daily. 11/09/15  Yes Ace Gins Sam, PA-C  amLODipine (NORVASC) 10 MG tablet Take 10 mg by mouth daily. Reported on 11/17/2015    Historical Provider, MD  aspirin EC 81 MG tablet Take 81 mg by mouth daily. Reported on 11/17/2015    Historical Provider, MD  dexamethasone (DECADRON) 4 MG tablet Take 1 tablet (4 mg total) by mouth 2 (two) times daily with a meal. Patient not taking: Reported on 10/15/2015 09/01/15   Ivery Quale, PA-C  diclofenac (VOLTAREN) 75 MG EC tablet Take 1 tablet (75 mg total) by mouth 2 (two) times  daily. Patient not taking: Reported on 10/15/2015 09/01/15   Ivery Quale, PA-C  doxycycline (VIBRAMYCIN) 100 MG capsule Take 1 capsule (100 mg total) by mouth 2 (two) times daily. Patient not taking: Reported on 11/17/2015 10/15/15   Rolland Porter, MD  DULoxetine (CYMBALTA) 60 MG capsule Take 60 mg by mouth daily. Reported on 11/17/2015    Historical Provider, MD  HYDROcodone-acetaminophen (NORCO/VICODIN) 5-325 MG tablet Take 1 tablet by mouth every 4 (four) hours as needed. Patient not taking: Reported on 11/17/2015 11/09/15   Ace Gins Sam, PA-C  ibuprofen (ADVIL,MOTRIN) 200 MG tablet Take 800 mg by mouth every 4 (four) hours as needed. For pain    Historical Provider, MD  levothyroxine (SYNTHROID, LEVOTHROID) 50 MCG tablet Take 50 mcg by mouth daily before breakfast. Reported on 11/17/2015    Historical Provider, MD  lisinopril-hydrochlorothiazide (PRINZIDE,ZESTORETIC) 20-12.5 MG per tablet Take 1 tablet by mouth daily. Reported on 11/17/2015    Historical Provider, MD  metFORMIN (GLUCOPHAGE) 500 MG tablet Take 500 mg by mouth at bedtime. Reported on 11/17/2015    Historical Provider, MD  methocarbamol (ROBAXIN) 500 MG tablet Take 1 tablet (500 mg total) by mouth 3 (three) times daily. Patient not taking: Reported on 10/15/2015 09/01/15   Ivery Quale, PA-C  potassium chloride (K-DUR) 10 MEQ tablet Take 1 tablet (10 mEq total) by mouth daily. Patient not taking: Reported on 11/17/2015 10/15/15   Rolland Porter, MD  predniSONE (DELTASONE) 20 MG tablet Take 1 tablet (20 mg total) by mouth 2 (two) times daily with a meal. Patient not taking: Reported on 11/17/2015 10/15/15   Rolland Porter, MD  rosuvastatin (CRESTOR) 10 MG tablet Take 10 mg by mouth daily. Reported on 11/17/2015    Historical Provider, MD   BP 148/97 mmHg  Pulse 84  Temp(Src) 98.3 F (36.8 C) (Tympanic)  Resp 17  Ht 5\' 2"  (1.575 m)  Wt 315 lb (142.883 kg)  BMI 57.60 kg/m2  SpO2 100% Physical Exam  Constitutional: She is oriented to person, place,  and time. She appears well-developed and well-nourished.  HENT:  Head: Normocephalic and atraumatic.  Eyes: Right eye exhibits no discharge. Left eye exhibits no discharge.  Neck: Normal range of motion. Neck supple. No tracheal deviation present.  Cardiovascular: Normal rate and regular rhythm.   Pulmonary/Chest: No respiratory distress. She has wheezes (xpiratory wheezes bilateral mild increased effort).  Abdominal: Soft. She exhibits no distension. There is no tenderness. There is no guarding.  Musculoskeletal: She exhibits edema (mild bilateral le).  Neurological: She is alert and oriented to person, place, and time.  Skin: Skin is warm. No rash noted.  Psychiatric: She has a normal mood and affect.  Nursing note and vitals reviewed.   ED Course  Procedures (including critical care time) Labs Review Labs Reviewed  CBC WITH DIFFERENTIAL/PLATELET - Abnormal; Notable for the  following:    RDW 15.9 (*)    All other components within normal limits  COMPREHENSIVE METABOLIC PANEL - Abnormal; Notable for the following:    Chloride 100 (*)    CO2 35 (*)    All other components within normal limits  TROPONIN I  BRAIN NATRIURETIC PEPTIDE    Imaging Review Dg Chest 2 View  11/17/2015  CLINICAL DATA:  Increasing shortness of breath since Saturday. Hypoxia. EXAM: CHEST  2 VIEW COMPARISON:  10/15/2015 FINDINGS: Chronic cardiopericardial enlargement. There is vascular pedicle widening and diffuse interstitial opacity. No effusion or pneumothorax. No acute osseous finding. IMPRESSION: CHF pattern Electronically Signed   By: Marnee Spring M.D.   On: 11/17/2015 13:59   I have personally reviewed and evaluated these images and lab results as part of my medical decision-making.   EKG Interpretation   Date/Time:  Tuesday Nov 17 2015 13:05:30 EDT Ventricular Rate:  72 PR Interval:  145 QRS Duration: 90 QT Interval:  415 QTC Calculation: 454 R Axis:   88 Text Interpretation:  Sinus rhythm  Borderline right axis deviation  Probable anterolateral infarct, old Confirmed by Omer Monter MD, Denina Rieger [phone])  on 11/17/2015 1:45:19 PM      MDM   Final diagnoses:  Acute exacerbation of chronic obstructive pulmonary disease (COPD) (HCC)  Hypoxia   Clinical concern for COPD exacerbation with medical history and exam However patient has had weight and and CHF history. Patient requiring 2 L nasal cannula in the ER. Continuous neb ordered. Mild improvement in the ER with nebulizers. Chest x-ray concerning for mild CHF. Lasix ordered.. Plan for admission for further treatment, steroids given.  The patients results and plan were reviewed and discussed.   Any x-rays performed were independently reviewed by myself.   Differential diagnosis were considered with the presenting HPI.  Medications  methylPREDNISolone sodium succinate (SOLU-MEDROL) 125 mg/2 mL injection 125 mg (not administered)  furosemide (LASIX) injection 40 mg (not administered)  ipratropium-albuterol (DUONEB) 0.5-2.5 (3) MG/3ML nebulizer solution 3 mL (3 mLs Nebulization Given 11/17/15 1329)  albuterol (PROVENTIL,VENTOLIN) solution continuous neb (10 mg/hr Nebulization Given 11/17/15 1420)  ipratropium (ATROVENT) nebulizer solution 0.5 mg (0.5 mg Nebulization Given 11/17/15 1420)    Filed Vitals:   11/17/15 1420 11/17/15 1430 11/17/15 1445 11/17/15 1500  BP:  130/61  148/97  Pulse:   74 84  Temp:      TempSrc:      Resp:      Height:      Weight:      SpO2: 95%  100% 100%    Final diagnoses:  Acute exacerbation of chronic obstructive pulmonary disease (COPD) (HCC)  Hypoxia    Admission/ observation were discussed with the admitting physician, patient and/or family and they are comfortable with the plan.    Blane Ohara, MD 11/17/15 [phone]

## 2015-11-18 ENCOUNTER — Inpatient Hospital Stay (HOSPITAL_COMMUNITY): Payer: Self-pay

## 2015-11-18 DIAGNOSIS — R0600 Dyspnea, unspecified: Secondary | ICD-10-CM

## 2015-11-18 DIAGNOSIS — J441 Chronic obstructive pulmonary disease with (acute) exacerbation: Principal | ICD-10-CM

## 2015-11-18 DIAGNOSIS — J9601 Acute respiratory failure with hypoxia: Secondary | ICD-10-CM

## 2015-11-18 LAB — HEMOGLOBIN A1C
Hgb A1c MFr Bld: 6.5 % — ABNORMAL HIGH (ref 4.8–5.6)
Mean Plasma Glucose: 140 mg/dL

## 2015-11-18 LAB — BASIC METABOLIC PANEL
Anion gap: 9 (ref 5–15)
BUN: 15 mg/dL (ref 6–20)
CO2: 34 mmol/L — ABNORMAL HIGH (ref 22–32)
Calcium: 9.2 mg/dL (ref 8.9–10.3)
Chloride: 97 mmol/L — ABNORMAL LOW (ref 101–111)
Creatinine, Ser: 0.53 mg/dL (ref 0.44–1.00)
GFR calc Af Amer: >60 mL/min (ref >60)
GFR calc non Af Amer: >60 mL/min (ref >60)
Glucose, Bld: 172 mg/dL — ABNORMAL HIGH (ref 65–99)
Potassium: 4 mmol/L (ref 3.5–5.1)
Sodium: 140 mmol/L (ref 135–145)

## 2015-11-18 LAB — GLUCOSE, CAPILLARY
Glucose-Capillary: 180 mg/dL — ABNORMAL HIGH (ref 65–99)
Glucose-Capillary: 217 mg/dL — ABNORMAL HIGH (ref 65–99)
Glucose-Capillary: 111 mg/dL — ABNORMAL HIGH (ref 65–99)
Glucose-Capillary: 221 mg/dL — ABNORMAL HIGH (ref 65–99)

## 2015-11-18 MED ORDER — METHYLPREDNISOLONE SODIUM SUCC 125 MG IJ SOLR
60.0000 mg | Freq: Two times a day (BID) | INTRAMUSCULAR | Status: DC
  Administered 2015-11-19 – 2015-11-20 (×3): 60 mg via INTRAVENOUS
  Filled 2015-11-18 (×3): qty 2

## 2015-11-18 MED ORDER — DICLOFENAC SODIUM 75 MG PO TBEC
75.0000 mg | DELAYED_RELEASE_TABLET | Freq: Two times a day (BID) | ORAL | Status: DC | PRN
  Administered 2015-11-19 (×2): 75 mg via ORAL
  Filled 2015-11-18 (×4): qty 1

## 2015-11-18 MED ORDER — PERFLUTREN LIPID MICROSPHERE
1.0000 mL | INTRAVENOUS | Status: DC | PRN
  Administered 2015-11-18: 5 mL via INTRAVENOUS

## 2015-11-18 MED ORDER — METHOCARBAMOL 500 MG PO TABS
500.0000 mg | ORAL_TABLET | Freq: Two times a day (BID) | ORAL | Status: DC | PRN
  Administered 2015-11-19 (×2): 500 mg via ORAL
  Filled 2015-11-18 (×2): qty 1

## 2015-11-18 NOTE — Evaluation (Signed)
Occupational Therapy Evaluation Patient Details Name: Marie West MRN: [id number] DOB: 08-Sep-1955 Today's Date: 11/18/2015    History of Present Illness Marie West is a 60 y.o. female with medical history significant for COPD, hypertension, type 2 diabetes mellitus, depression, hypothyroidism, and obesity with BMI 58 presents to the ED with dyspnea at rest and wheezing. Patient was seen in the ED approximately one month ago for similar symptoms and was discharged home with antibiotics, steroid burst, and nebulized breathing treatments for COPD exacerbation but reports that she never seemed to make a full recovery from that episode. She denies any fevers, chills, long distance travel, or sick contacts. There's been mild increase in her chronic cough but without significant sputum production. There has been no chest pain or palpitations associated with this, but the patient reports progressive bilateral lower extremity swelling. She has been taking Lasix 20 mg daily, but with no appreciable change in her respiratory symptoms or swelling. Patient continues to abuse tobacco, reporting that she has recently switched from cigarettes to vaper pen.    Clinical Impression   Pt awake, alert, oriented x4 this am, agreeable to OT evaluation. Pt relays course of symptoms and hospitalization to OT in detail. Pt is at baseline with ADL completion, requiring assistance for LB dressing, meal preparation, and household tasks. Pt reports use of energy conservation strategies including taking rest breaks as needed and sitting for task completion when able. Pt has support at home including boyfriend and daughters, who provide assistance as needed. Pt is at baseline with functional task completion, no further OT services required at this time.     Follow Up Recommendations  No OT follow up;Supervision - Intermittent    Equipment Recommendations  None recommended by OT       Precautions / Restrictions  Precautions Precautions: None Restrictions Weight Bearing Restrictions: No      Mobility Bed Mobility Overal bed mobility: Modified Independent                        ADL Overall ADL's : Needs assistance/impaired                 Upper Body Dressing : Minimal assistance;Sitting   Lower Body Dressing: Total assistance;Sitting/lateral leans                 General ADL Comments: Pt is able to complete ADL tasks with mod independence to max assist. Per pt report boyfriend assists with LB dressing due to inability to reach lower legs and feet. Pt requires increased time and rest breaks for ADL completion.      Vision Vision Assessment?: No apparent visual deficits          Pertinent Vitals/Pain Pain Assessment: No/denies pain     Hand Dominance Right   Extremity/Trunk Assessment Upper Extremity Assessment Upper Extremity Assessment: RUE deficits/detail RUE Deficits / Details: chronic rotator cuff tear. ROM 75%, strength 3/5   Lower Extremity Assessment Lower Extremity Assessment: Defer to PT evaluation       Communication Communication Communication: No difficulties   Cognition Arousal/Alertness: Awake/alert Behavior During Therapy: WFL for tasks assessed/performed Overall Cognitive Status: Within Functional Limits for tasks assessed                                Home Living Family/patient expects to be discharged to:: Private residence Living Arrangements: Spouse/significant other Available Help at Discharge:  Family;Available 24 hours/day Type of Home: House             Bathroom Shower/Tub: Walk-in Human resources officershower   Bathroom Toilet: Standard     Home Equipment: None          Prior Functioning/Environment Level of Independence: Needs assistance    ADL's / Homemaking Assistance Needed: Pt requires assistance with dressing tasks, meal preparation, and housekeeping tasks        OT Diagnosis: Generalized weakness   OT  Problem List: Cardiopulmonary status limiting activity    End of Session    Activity Tolerance:  (limited by SOB) Patient left: in bed;with call bell/phone within reach   Time: 0815-0835 OT Time Calculation (min): 20 min Charges:  OT General Charges $OT Visit: 1 Procedure OT Evaluation $OT Eval Low Complexity: 1 Procedure  Ezra SitesLeslie Hanzel Pizzo, OTR/L  [phone]  11/18/2015, 8:46 AM

## 2015-11-18 NOTE — Progress Notes (Addendum)
Initial Nutrition Assessment  DOCUMENTATION CODES:  Morbid obesity  INTERVENTION:  Monitor PO intake and reassess need for any education, supplements or snacks as warranted  NUTRITION DIAGNOSIS:  Morbid obesity related to Excessive energy intake vs expenditure as evidenced by  BMI > 60.  GOAL:  Patient will meet greater than or equal to 90% of their needs  -wt loss not reccommended during acute illness, would be beneficial when back to baseline level of health  MONITOR:  PO intake, Labs, I & O's  REASON FOR ASSESSMENT:  Consult COPD Protocol  ASSESSMENT:  60 y/o female PMHx COPD, htn, DM2, Depression, Hypothyroidism, morbid obesity presents w/ dyspnea, wheezing and reported progressive BLE swelling. Admitted for management of acute respiratory failure and potential CHF.   Pt notes no recent changes in her PO intake. At home she tries to follow a DM diet, though reports difficulty due to cooking for many different people who like different things. She does eat 3 meals a day and drinks an atkins shake in the mornings. She says she has been better with the DM diet in the past and says she want to try again because she knows her sugars are higher from the steroids. She did not take any vitamin or mineral supplements.   Denies any n/v/c/d. She does report some dypsnea w/ PO intake and will get choked if trying to eat too fast. She says she is able to manage this well by eating slowly. Her main concern at this time is her self stated stroke like symptoms -She Self reports trouble speaking/confusion with speaking, saying "Its like I had a stroke"    Her weight of 315 lbs was reported. Very likely many of her past weights were also reported. Long term appears to have gained weight  She declined any nutritional interventions at this time  NFPE: morbidly obese  Labs reviewed: Hyperglycemia. A1C yesterday was 6.5    Recent Labs Lab 11/17/15 1310 11/18/15 0436  NA 141 140  K 4.0 4.0   CL 100* 97*  CO2 35* 34*  BUN 15 15  CREATININE 0.63 0.53  CALCIUM 8.9 9.2  GLUCOSE 95 172*   Diet Order:  Diet regular Room service appropriate?: Yes; Fluid consistency:: Thin  Skin: Dry, ulcer to shin  Last BM:  5/30  Height:  Ht Readings from Last 1 Encounters:  11/17/15 5\' 2"  (1.575 m)   Weight:  Wt Readings from Last 1 Encounters:  11/18/15 342 lb (155.13 kg)   Wt Readings from Last 10 Encounters:  11/18/15 342 lb (155.13 kg)  10/15/15 315 lb (142.883 kg)  09/24/15 315 lb (142.883 kg)  09/01/15 320 lb (145.151 kg)  03/23/15 315 lb (142.883 kg)  02/10/15 319 lb (144.697 kg)  12/19/13 300 lb (136.079 kg)  07/11/13 338 lb 8 oz (153.543 kg)  05/06/13 300 lb (136.079 kg)  03/12/13 290 lb (131.543 kg)   Ideal Body Weight:  50 kg  BMI:  Body mass index is 62.54 kg/(m^2).  Estimated Nutritional Needs:  Kcal:  1400-1600 kcals  Protein:  60-70g (1.2-1.4 g/kg ibw) Fluid:  1.4-1.6 kliters fluid  EDUCATION NEEDS:  Education needs not appropriate at this time  Christophe LouisNathan Raymel Cull RD, LDN Clinical Nutrition Pager: [phone] 11/18/2015 3:14 PM

## 2015-11-18 NOTE — Care Management Note (Signed)
Case Management Note  Patient Details  Name: Marie West MRN: [id number] Date of Birth: September 25, 1955  Subjective/Objective:  Spoke with patient for discharge planning. Patient is from home with boyfriend and drives self. 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 Will need PCP at discharge , wants [facility] free clinic. Ambulates without walker or cane. Will need discharge medication assistance.               Action/Plan:Home with self care. PCP and medication assistance.    Expected Discharge Date:                  Expected Discharge Plan:  Home/Self Care  In-House Referral:     Discharge planning Services  CM Consult, Medication Assistance, Homebound not met per provider, [place] Acute Care Choice:    Choice offered to:     DME Arranged:    DME Agency:     HH Arranged:    HH Agency:     Status of Service:  In process, will continue to follow  Medicare Important Message Given:    Date Medicare IM Given:    Medicare IM give by:    Date Additional Medicare IM Given:    Additional Medicare Important Message give by:     If discussed at [place] of Stay Meetings, dates discussed:    Additional Comments:  Alvie Heidelberg, RN 11/18/2015, 3:32 PM

## 2015-11-18 NOTE — Clinical Social Work Note (Signed)
CSW received referral for COPD gold protocol. Pt does not meet criteria as this is first admission in last 6 months. CSW will sign off, but can be reconsulted if needed.  Deyanira Fesler, LCSW [phone] 

## 2015-11-18 NOTE — Progress Notes (Signed)
PROGRESS NOTE    Marie West  ZOX:[id number] DOB: 12-Oct-1955 DOA: 11/17/2015 PCP: Bobbye RiggsMand, Sylvia, NP     Brief Narrative:  60 year old woman admitted on 5/30 with complaints of shortness of breath. She was found to have what we believe is a COPD with acute exacerbation. Less likely to be acute CHF.   Assessment & Plan:   Principal Problem:   Acute respiratory failure with hypoxia (HCC) Active Problems:   COPD with exacerbation (HCC)   Tobacco abuse   Morbid obesity (HCC)   possible CHF (congestive heart failure)   Depression   Hypertension   Non-insulin dependent type 2 diabetes mellitus (HCC)   Hypothyroidism   Acute exacerbation of chronic obstructive pulmonary disease (COPD) (HCC)   Hypoxia   Acute hypoxemic respiratory failure -Likely due to COPD exacerbation, unlikely that acute CHF is contributing. -Provide oxygen supplementation as needed, may need home oxygen upon discharge.  COPD with acute exacerbation -Counseled on smoking cessation. -Wheezing has improved, titrate steroids, continue nebs.  CHF, type unknown -Likely is chronic in nature, do not suspect acute CHF is contributing to her current presentation.  Morbid obesity -Noted.  Tobacco abuse -Counseled on cessation.  Hypothyroidism -Continue Synthroid  Type 2 diabetes mellitus -Fair control, continue to adjust regimen as needed  Hypertension -Well-controlled   DVT prophylaxis: Lovenox Code Status: Full code Family Communication: Husband at bedside updated on plan of care Disposition Plan: Likely home in 24-48 hours  Consultants:   None  Procedures:   None  Antimicrobials:   None    Subjective: Feels much improved, less short of breath, no chest pain  Objective: Filed Vitals:   11/18/15 0909 11/18/15 1000 11/18/15 1025 11/18/15 1421  BP:    131/71  Pulse:    77  Temp:    98.3 F (36.8 C)  TempSrc:      Resp:    20  Height:      Weight:      SpO2: 84% 86% 85% 93%      Intake/Output Summary (Last 24 hours) at 11/18/15 1755 Last data filed at 11/18/15 1200  Gross per 24 hour  Intake    480 ml  Output    450 ml  Net     30 ml   Filed Weights   11/17/15 1255 11/18/15 0514  Weight: 142.883 kg (315 lb) 155.13 kg (342 lb)    Examination:  General exam: Alert, awake, oriented x 3 Respiratory system: Mild bibasilar wheezes Cardiovascular system:RRR. No murmurs, rubs, gallops. Gastrointestinal system: Abdomen is nondistended, soft and nontender. No organomegaly or masses felt. Normal bowel sounds heard. Central nervous system: Alert and oriented. No focal neurological deficits. Extremities: No C/C/E, +pedal pulses Skin: No rashes, lesions or ulcers Psychiatry: Judgement and insight appear normal. Mood & affect appropriate.     Data Reviewed: I have personally reviewed following labs and imaging studies  CBC:  Recent Labs Lab 11/17/15 1310  WBC 7.0  NEUTROABS 4.9  HGB 12.8  HCT 42.6  MCV 94.5  PLT 242   Basic Metabolic Panel:  Recent Labs Lab 11/17/15 1310 11/18/15 0436  NA 141 140  K 4.0 4.0  CL 100* 97*  CO2 35* 34*  GLUCOSE 95 172*  BUN 15 15  CREATININE 0.63 0.53  CALCIUM 8.9 9.2   GFR: Estimated Creatinine Clearance: 108.7 mL/min (by C-G formula based on Cr of 0.53). Liver Function Tests:  Recent Labs Lab 11/17/15 1310  AST 24  ALT 49  ALKPHOS 63  BILITOT 0.5  PROT 7.0  ALBUMIN 3.9   No results for input(s): LIPASE, AMYLASE in the last 168 hours. No results for input(s): AMMONIA in the last 168 hours. Coagulation Profile: No results for input(s): INR, PROTIME in the last 168 hours. Cardiac Enzymes:  Recent Labs Lab 11/17/15 1310  TROPONINI 0.03   BNP (last 3 results) No results for input(s): PROBNP in the last 8760 hours. HbA1C:  Recent Labs  11/17/15 1310  HGBA1C 6.5*   CBG:  Recent Labs Lab 11/17/15 1700 11/17/15 2058 11/18/15 0726 11/18/15 1133 11/18/15 1628  GLUCAP 96 200* 180*  217* 111*   Lipid Profile: No results for input(s): CHOL, HDL, LDLCALC, TRIG, CHOLHDL, LDLDIRECT in the last 72 hours. Thyroid Function Tests: No results for input(s): TSH, T4TOTAL, FREET4, T3FREE, THYROIDAB in the last 72 hours. Anemia Panel: No results for input(s): VITAMINB12, FOLATE, FERRITIN, TIBC, IRON, RETICCTPCT in the last 72 hours. Urine analysis:    Component Value Date/Time   COLORURINE YELLOW 06/01/2012 1919   APPEARANCEUR CLEAR 06/01/2012 1919   LABSPEC 1.025 06/01/2012 1919   PHURINE 6.5 06/01/2012 1919   GLUCOSEU NEGATIVE 06/01/2012 1919   HGBUR NEGATIVE 06/01/2012 1919   BILIRUBINUR NEGATIVE 06/01/2012 1919   KETONESUR NEGATIVE 06/01/2012 1919   PROTEINUR TRACE* 06/01/2012 1919   UROBILINOGEN 0.2 06/01/2012 1919   NITRITE NEGATIVE 06/01/2012 1919   LEUKOCYTESUR NEGATIVE 06/01/2012 1919   Sepsis Labs: (procalcitonin:4,lacticidven:4)  )No results found for this or any previous visit (from the past 240 hour(s)).       Radiology Studies: Dg Chest 2 View  11/17/2015  CLINICAL DATA:  Increasing shortness of breath since Saturday. Hypoxia. EXAM: CHEST  2 VIEW COMPARISON:  10/15/2015 FINDINGS: Chronic cardiopericardial enlargement. There is vascular pedicle widening and diffuse interstitial opacity. No effusion or pneumothorax. No acute osseous finding. IMPRESSION: CHF pattern Electronically Signed   By: Marnee Spring M.D.   On: 11/17/2015 13:59        Scheduled Meds: . amLODipine  10 mg Oral Daily  . aspirin EC  81 mg Oral Daily  . DULoxetine  60 mg Oral Daily  . enoxaparin (LOVENOX) injection  70 mg Subcutaneous Q24H  . lisinopril  20 mg Oral Daily   And  . hydrochlorothiazide  12.5 mg Oral Daily  . insulin aspart  0-9 Units Subcutaneous TID WC  . levothyroxine  50 mcg Oral QAC breakfast  . loratadine  10 mg Oral Daily  . methylPREDNISolone (SOLU-MEDROL) injection  60 mg Intravenous Q6H  . mometasone-formoterol  2 puff Inhalation BID  .  nicotine  14 mg Transdermal Daily  . rosuvastatin  10 mg Oral Daily  . sodium chloride flush  3 mL Intravenous Q12H   Continuous Infusions:    LOS: 1 day    Time spent: 25 minutes. Greater than 50% of this time was spent in direct contact with the patient coordinating care.     Chaya Jan, MD [facility] Pager [phone]  If 7PM-7AM, please contact night-coverage [email or web address] Password [facility] 11/18/2015, 5:55 PM

## 2015-11-18 NOTE — Progress Notes (Signed)
Inpatient Diabetes Program Recommendations  AACE/ADA: New Consensus Statement on Inpatient Glycemic Control (2015)  Target Ranges:  Prepandial:   less than 140 mg/dL      Peak postprandial:   less than 180 mg/dL (1-2 hours)      Critically ill patients:  140 - 180 mg/dL  Results for Marie West, Marie West (MRN [id number]) as of 11/18/2015 09:32  Ref. Range 11/17/2015 17:00 11/17/2015 20:58 11/18/2015 07:26  Glucose-Capillary Latest Ref Range: 65-99 mg/dL 96 875200 (H) 643180 (H)   Review of Glycemic Control  Diabetes history: DM2 Outpatient Diabetes medications: Metformin 500 mg QHS Current orders for Inpatient glycemic control: Novolog 0-9 units TID with meals  Inpatient Diabetes Program Recommendations: HgbA1C: A1C 6.5% on 11/17/15 indicating good glycemic control over the past 2-3 months. Diet: Patient is currently ordered Regular diet. Please change diet to Carb Modified diet. Insulin-Correction: Please consider ordering Novolog bedtime correction scale.  Thanks, Orlando PennerMarie Florita Nitsch, RN, MSN, CDE Diabetes Coordinator Inpatient Diabetes Program [phone] (Team Pager from 8am to 5pm) [phone] ([facility] office) [phone] [facility] office) [phone] [facility] office)

## 2015-11-18 NOTE — Evaluation (Signed)
Physical Therapy Evaluation Patient Details Name: Marie West MRN: [id number] DOB: [birth year] Today's Date: 11/18/2015   History of Present Illness  60 yo F admitted due to dyspnea at rest and wheezing with B LE swelling.  DX: acute on chronic respiratory failure, and CHF.  PMH: COPD, HTN, DM2, depression, hypothyroidism, obesity with BMI of 58, MRSA, fibromyalgia, sciatica, arthritis, knee surgery, retinal detachment.   Clinical Impression  Pt received up in room, changing her gown, not wearing supplemental O2 and found to be saturating at 86% on RA.  Re-applied 2L of O2, and improved >90%.  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  She receives some assistance for dressing occasionally, but is independent for bathing.  Her significant other and dtr assist with cooking/cleaning, and running errands.  Pt states that for the past 3 months it has become more difficult for her to walk from the parking lot into her work (~73ft) due to SOB.  During PT evaluation today, she demonstrates Mod (I) for sit<>stand transfers, and ambulated 121ft with no AD but with supplemental O2 at 2L via [place], and desaturated to 85%.  Pt is able to recover >90% with seated rest and education on purse lipped breathing.  Pt does not demonstrate need for continued skilled PT needs, however she may need some Home O2 upon d/c.      Follow Up Recommendations No PT follow up    Equipment Recommendations  Other (comment) (Possibly home O2 - SpO2 down to 86% on RA ambulating around the room, and down to 85% on 2L ambulating 236ft in hallway)    Recommendations for Other Services       Precautions / Restrictions Precautions Precautions: None Restrictions Weight Bearing Restrictions: No      Mobility  Bed Mobility Overal bed mobility:  (Not assessed, pt up and ambulating in the room upon arrival. )                Transfers Overall transfer level:  Modified independent                  Ambulation/Gait Ambulation/Gait assistance: Modified independent (Device/Increase time);Supervision Ambulation Distance (Feet): 100 Feet Assistive device: None Gait Pattern/deviations: Step-through pattern;Wide base of support     General Gait Details: 2 short standing recovery periods, and vc's to slow pace due to pt becomming SOB.  SpO2 desaturated to 85% on 2L during ambulation, but improved >90% with seated rest break and cues for purse lipped breathing.   Stairs            Wheelchair Mobility    Modified Rankin (Stroke Patients Only)       Balance Overall balance assessment: No apparent balance deficits (not formally assessed)                                           Pertinent Vitals/Pain Pain Assessment: No/denies pain    Home Living Family/patient expects to be discharged to:: Private residence Living Arrangements: Spouse/significant other Available Help at Discharge: Family;Available 24 hours/day Type of Home: House Home Access: Stairs to enter   [employer] of Steps: 1 step Home Layout: One level Home Equipment: None      Prior Function Level of Independence: Needs assistance   Gait / Transfers Assistance Needed: Pt ambulates independently for  short distances, however when going to the grocery store, she requires a cart to hold/lean on   ADL's / Homemaking Assistance Needed: Pt requires assistance with dressing tasks, meal preparation, and housekeeping tasks        Hand Dominance        Extremity/Trunk Assessment   Upper Extremity Assessment: Overall WFL for tasks assessed RUE Deficits / Details: Pt states she was in the ED this past monday due to R rotator cuff tear.          Lower Extremity Assessment: Overall WFL for tasks assessed         Communication   Communication: No difficulties  Cognition Arousal/Alertness: Awake/alert Behavior During Therapy: WFL  for tasks assessed/performed Overall Cognitive Status: Within Functional Limits for tasks assessed (Pt states that prior to admission she had a few episodes of calling objects incorrect names)                      General Comments      Exercises        Assessment/Plan    PT Assessment Patent does not need any further PT services  PT Diagnosis Generalized weakness   PT Problem List    PT Treatment Interventions     PT Goals (Current goals can be found in the Care Plan section) Acute Rehab PT Goals Patient Stated Goal: Pt wants to go home.  PT Goal Formulation: With patient Time For Goal Achievement: 11/25/15 Potential to Achieve Goals: Good    Frequency     Barriers to discharge        Co-evaluation               End of Session Equipment Utilized During Treatment: Oxygen Activity Tolerance: Patient limited by fatigue Patient left: in chair;with call bell/phone within reach Nurse Communication: Mobility status    Functional Assessment Tool Used: [employer] AM-PAC "6-clicks"  Functional Limitation: Mobility: Walking and moving around Mobility: Walking and Moving Around Current Status [phone](G8978): At least 1 percent but less than 20 percent impaired, limited or restricted Mobility: Walking and Moving Around Goal Status [phone](G8979): At least 1 percent but less than 20 percent impaired, limited or restricted Mobility: Walking and Moving Around Discharge Status [phone](G8980): At least 1 percent but less than 20 percent impaired, limited or restricted    Time: 1000-1025 PT Time Calculation (min) (ACUTE ONLY): 25 min   Charges:   PT Evaluation $PT Eval Moderate Complexity: 1 Procedure PT Treatments $Gait Training: 8-22 mins   PT G Codes:   PT G-Codes **NOT FOR INPATIENT CLASS** Functional Assessment Tool Used: [employer] AM-PAC "6-clicks"  Functional Limitation: Mobility: Walking and moving around Mobility: Walking and Moving Around Current Status  [phone](G8978): At least 1 percent but less than 20 percent impaired, limited or restricted Mobility: Walking and Moving Around Goal Status [phone](G8979): At least 1 percent but less than 20 percent impaired, limited or restricted Mobility: Walking and Moving Around Discharge Status [phone](G8980): At least 1 percent but less than 20 percent impaired, limited or restricted    Carollee HerterBeth Nakai Pollio, PT, DPT X: [phone]   11/18/2015, 12:41 PM

## 2015-11-19 LAB — BASIC METABOLIC PANEL
Anion gap: 7 (ref 5–15)
BUN: 25 mg/dL — ABNORMAL HIGH (ref 6–20)
CO2: 34 mmol/L — ABNORMAL HIGH (ref 22–32)
Calcium: 8.8 mg/dL — ABNORMAL LOW (ref 8.9–10.3)
Chloride: 98 mmol/L — ABNORMAL LOW (ref 101–111)
Creatinine, Ser: 0.58 mg/dL (ref 0.44–1.00)
GFR calc Af Amer: >60 mL/min (ref >60)
GFR calc non Af Amer: >60 mL/min (ref >60)
Glucose, Bld: 150 mg/dL — ABNORMAL HIGH (ref 65–99)
Potassium: 4.2 mmol/L (ref 3.5–5.1)
Sodium: 139 mmol/L (ref 135–145)

## 2015-11-19 LAB — ECHOCARDIOGRAM COMPLETE
Height: 62 in
Weight: 5472 oz

## 2015-11-19 LAB — GLUCOSE, CAPILLARY
Glucose-Capillary: 166 mg/dL — ABNORMAL HIGH (ref 65–99)
Glucose-Capillary: 185 mg/dL — ABNORMAL HIGH (ref 65–99)
Glucose-Capillary: 152 mg/dL — ABNORMAL HIGH (ref 65–99)

## 2015-11-19 NOTE — Progress Notes (Signed)
Inpatient Diabetes Program Recommendations  AACE/ADA: New Consensus Statement on Inpatient Glycemic Control (2015)  Target Ranges:  Prepandial:   less than 140 mg/dL      Peak postprandial:   less than 180 mg/dL (1-2 hours)      Critically ill patients:  140 - 180 mg/dL  Results for Marie West, Marie West (MRN [id number]) as of 11/19/2015 11:19  Ref. Range 11/18/2015 07:26 11/18/2015 11:33 11/18/2015 16:28 11/18/2015 20:39 11/19/2015 07:28  Glucose-Capillary Latest Ref Range: 65-99 mg/dL 782180 (H) 956217 (H) 213111 (H) 221 (H) 166 (H)   Review of Glycemic Control  Diabetes history: DM2 Outpatient Diabetes medications: Metformin 500 mg QHS Current orders for Inpatient glycemic control: Novolog 0-9 units TID with meals  Inpatient Diabetes Program Recommendations: HgbA1C: A1C 6.5% on 11/17/15 indicating good glycemic control over the past 2-3 months. Insulin-Correction: Please consider ordering Novolog bedtime correction scale.  Thanks, Orlando PennerMarie Lanai Conlee, RN, MSN, CDE Diabetes Coordinator Inpatient Diabetes Program [phone] (Team Pager from 8am to 5pm) [phone] ([place] office) [phone] [facility] office) [phone] [facility] office)

## 2015-11-19 NOTE — Progress Notes (Signed)
SATURATION QUALIFICATIONS: (This note is used to comply with regulatory documentation for home oxygen)  Patient Saturations on Room Air at Rest = 91%  Patient Saturations on Room Air while Ambulating = 77%  Patient Saturations on 2 Liters of oxygen while Ambulating = 92%  Please briefly explain why patient needs home oxygen: while ambulating on Room Air, pt desats into the 70s with complaints of dizziness.

## 2015-11-19 NOTE — Progress Notes (Signed)
PROGRESS NOTE    Marie West  ZOX:[id number] DOB: Apr 07, 1956 DOA: 11/17/2015 PCP: Bobbye Riggs, NP     Brief Narrative:  60 year old woman admitted on 5/30 with complaints of shortness of breath. She was found to have what we believe is  COPD with acute exacerbation. Less likely to be acute CHF.   Assessment & Plan:   Principal Problem:   Acute respiratory failure with hypoxia (HCC) Active Problems:   COPD with exacerbation (HCC)   Tobacco abuse   Morbid obesity (HCC)   possible CHF (congestive heart failure)   Depression   Hypertension   Non-insulin dependent type 2 diabetes mellitus (HCC)   Hypothyroidism   Acute exacerbation of chronic obstructive pulmonary disease (COPD) (HCC)   Hypoxia   Acute hypoxemic respiratory failure -Likely due to COPD exacerbation, unlikely that acute CHF is contributing. -Provide oxygen supplementation as needed, will likely need home oxygen upon discharge.  COPD with acute exacerbation -Counseled on smoking cessation. -Psoas significant wheezing today, will hold off on titrating steroids, continue nebs.  Chronic diastolic CHF -2-D echo: Left ventricle: The cavity size was normal. Wall thickness was  increased in a pattern of mild LVH. Systolic function was normal.  The estimated ejection fraction was in the range of 60% to 65%.  Features are consistent with a pseudonormal left ventricular  filling pattern, with concomitant abnormal relaxation and  increased filling pressure (grade 2 diastolic dysfunction). -Do not believe acute CHF is playing a role in her shortness of breath.  Morbid obesity -Noted.  Tobacco abuse -Counseled on cessation.  Hypothyroidism -Continue Synthroid  Type 2 diabetes mellitus -Fair control, continue to adjust regimen as needed  Hypertension -Well-controlled   DVT prophylaxis: Lovenox Code Status: Full code Family Communication: Husband at bedside updated on plan of care Disposition  Plan: Likely home in 24-48 hours  Consultants:   None  Procedures:   None  Antimicrobials:   None    Subjective: Feels much improved, less short of breath, no chest pain  Objective: Filed Vitals:   11/18/15 2134 11/19/15 0507 11/19/15 1028 11/19/15 1403  BP: 125/60 155/84  110/50  Pulse: 74 79  81  Temp: 98.9 F (37.2 C) 98 F (36.7 C)  98.2 F (36.8 C)  TempSrc: Oral Oral    Resp: Height:      Weight:  155.085 kg (341 lb 14.4 oz)    SpO2: 93% 91% 92% 92%    Intake/Output Summary (Last 24 hours) at 11/19/15 1549 Last data filed at 11/19/15 1508  Gross per 24 hour  Intake   1320 ml  Output   2100 ml  Net   -780 ml   Filed Weights   11/17/15 1255 11/18/15 0514 11/19/15 0507  Weight: 142.883 kg (315 lb) 155.13 kg (342 lb) 155.085 kg (341 lb 14.4 oz)    Examination:  General exam: Alert, awake, oriented x 3 Respiratory system: Mild bibasilar wheezes Cardiovascular system:RRR. No murmurs, rubs, gallops. Gastrointestinal system: Abdomen is nondistended, soft and nontender. No organomegaly or masses felt. Normal bowel sounds heard. Central nervous system: Alert and oriented. No focal neurological deficits. Extremities: No C/C/E, +pedal pulses Skin: No rashes, lesions or ulcers Psychiatry: Judgement and insight appear normal. Mood & affect appropriate.     Data Reviewed: I have personally reviewed following labs and imaging studies  CBC:  Recent Labs Lab 11/17/15 1310  WBC 7.0  NEUTROABS 4.9  HGB 12.8  HCT 42.6  MCV 94.5  PLT  242   Basic Metabolic Panel:  Recent Labs Lab 11/17/15 1310 11/18/15 0436 11/19/15 0548  NA 141 140 139  K 4.0 4.0 4.2  CL 100* 97* 98*  CO2 35* 34* 34*  GLUCOSE 95 172* 150*  BUN 15 15 25*  CREATININE 0.63 0.53 0.58  CALCIUM 8.9 9.2 8.8*   GFR: Estimated Creatinine Clearance: 108.7 mL/min (by C-G formula based on Cr of 0.58). Liver Function Tests:  Recent Labs Lab 11/17/15 1310  AST 24  ALT 49    ALKPHOS 63  BILITOT 0.5  PROT 7.0  ALBUMIN 3.9   No results for input(s): LIPASE, AMYLASE in the last 168 hours. No results for input(s): AMMONIA in the last 168 hours. Coagulation Profile: No results for input(s): INR, PROTIME in the last 168 hours. Cardiac Enzymes:  Recent Labs Lab 11/17/15 1310  TROPONINI 0.03   BNP (last 3 results) No results for input(s): PROBNP in the last 8760 hours. HbA1C:  Recent Labs  11/17/15 1310  HGBA1C 6.5*   CBG:  Recent Labs Lab 11/18/15 1133 11/18/15 1628 11/18/15 2039 11/19/15 0728 11/19/15 1117  GLUCAP 217* 111* 221* 166* 185*   Lipid Profile: No results for input(s): CHOL, HDL, LDLCALC, TRIG, CHOLHDL, LDLDIRECT in the last 72 hours. Thyroid Function Tests: No results for input(s): TSH, T4TOTAL, FREET4, T3FREE, THYROIDAB in the last 72 hours. Anemia Panel: No results for input(s): VITAMINB12, FOLATE, FERRITIN, TIBC, IRON, RETICCTPCT in the last 72 hours. Urine analysis:    Component Value Date/Time   COLORURINE YELLOW 06/01/2012 1919   APPEARANCEUR CLEAR 06/01/2012 1919   LABSPEC 1.025 06/01/2012 1919   PHURINE 6.5 06/01/2012 1919   GLUCOSEU NEGATIVE 06/01/2012 1919   HGBUR NEGATIVE 06/01/2012 1919   BILIRUBINUR NEGATIVE 06/01/2012 1919   KETONESUR NEGATIVE 06/01/2012 1919   PROTEINUR TRACE* 06/01/2012 1919   UROBILINOGEN 0.2 06/01/2012 1919   NITRITE NEGATIVE 06/01/2012 1919   LEUKOCYTESUR NEGATIVE 06/01/2012 1919   Sepsis Labs: @LABRCNTIP (procalcitonin:4,lacticidven:4)  )No results found for this or any previous visit (from the past 240 hour(s)).       Radiology Studies: No results found.      Scheduled Meds: . amLODipine  10 mg Oral Daily  . aspirin EC  81 mg Oral Daily  . DULoxetine  60 mg Oral Daily  . enoxaparin (LOVENOX) injection  70 mg Subcutaneous Q24H  . lisinopril  20 mg Oral Daily   And  . hydrochlorothiazide  12.5 mg Oral Daily  . insulin aspart  0-9 Units Subcutaneous TID WC  .  levothyroxine  50 mcg Oral QAC breakfast  . loratadine  10 mg Oral Daily  . methylPREDNISolone (SOLU-MEDROL) injection  60 mg Intravenous Q12H  . mometasone-formoterol  2 puff Inhalation BID  . nicotine  14 mg Transdermal Daily  . rosuvastatin  10 mg Oral Daily  . sodium chloride flush  3 mL Intravenous Q12H   Continuous Infusions:    LOS: 2 days    Time spent: 25 minutes. Greater than 50% of this time was spent in direct contact with the patient coordinating care.     Chaya JanHERNANDEZ ACOSTA,ESTELA, MD [facility] Pager [phone]  If 7PM-7AM, please contact night-coverage [email or web address] Password [email or web address] 11/19/2015, 3:49 PM

## 2015-11-20 LAB — BASIC METABOLIC PANEL
Anion gap: 7 (ref 5–15)
BUN: 32 mg/dL — ABNORMAL HIGH (ref 6–20)
CO2: 33 mmol/L — ABNORMAL HIGH (ref 22–32)
Calcium: 8.7 mg/dL — ABNORMAL LOW (ref 8.9–10.3)
Chloride: 99 mmol/L — ABNORMAL LOW (ref 101–111)
Creatinine, Ser: 0.76 mg/dL (ref 0.44–1.00)
GFR calc Af Amer: >60 mL/min (ref >60)
GFR calc non Af Amer: >60 mL/min (ref >60)
Glucose, Bld: 144 mg/dL — ABNORMAL HIGH (ref 65–99)
Potassium: 4.4 mmol/L (ref 3.5–5.1)
Sodium: 139 mmol/L (ref 135–145)

## 2015-11-20 LAB — GLUCOSE, CAPILLARY
Glucose-Capillary: 154 mg/dL — ABNORMAL HIGH (ref 65–99)
Glucose-Capillary: 226 mg/dL — ABNORMAL HIGH (ref 65–99)
Glucose-Capillary: 157 mg/dL — ABNORMAL HIGH (ref 65–99)

## 2015-11-20 MED ORDER — PREDNISONE 10 MG PO TABS: 10.0000 mg | ORAL_TABLET | Freq: Every day | ORAL | Status: DC

## 2015-11-20 MED ORDER — DICLOFENAC SODIUM 75 MG PO TBEC: 75.0000 mg | DELAYED_RELEASE_TABLET | Freq: Two times a day (BID) | ORAL | Status: DC | PRN

## 2015-11-20 NOTE — Discharge Summary (Signed)
Physician Discharge Summary  Marie West AOZ:[id number] DOB: 1956-02-04 DOA: 11/17/2015  PCP: Marie Riggs, NP  Admit date: 11/17/2015 Discharge date: 11/20/2015  Time spent: 45 minutes  Recommendations for Outpatient Follow-up:  -We'll be discharge home today. -Advised to follow-up with primary care provider as scheduled.   Discharge Diagnoses:  Principal Problem:   Acute respiratory failure with hypoxia (HCC) Active Problems:   COPD with exacerbation (HCC)   Tobacco abuse   Morbid obesity (HCC)   possible CHF (congestive heart failure)   Depression   Hypertension   Non-insulin dependent type 2 diabetes mellitus (HCC)   Hypothyroidism   Acute exacerbation of chronic obstructive pulmonary disease (COPD) (HCC)   Hypoxia   Discharge Condition: Stable and improved  Filed Weights   11/18/15 0514 11/19/15 0507 11/20/15 0555  Weight: 155.13 kg (342 lb) 155.085 kg (341 lb 14.4 oz) 156.446 kg (344 lb 14.4 oz)    History of present illness:  As per Dr. Antionette West on 5/30: Marie West is a 60 y.o. female with medical history significant for COPD, hypertension, type 2 diabetes mellitus, depression, hypothyroidism, and obesity with BMI 58 presents to the ED with dyspnea at rest and wheezing. Patient was seen in the ED approximately one month ago for similar symptoms and was discharged home with antibiotics, steroid burst, and nebulized breathing treatments for COPD exacerbation but reports that she never seemed to make a full recovery from that episode. She denies any fevers, chills, long distance travel, or sick contacts. There's been mild increase in her chronic cough but without significant sputum production. There has been no chest pain or palpitations associated with this, but the patient reports progressive bilateral lower extremity swelling. She has been taking Lasix 20 mg daily, but with no appreciable change in her respiratory symptoms or swelling. Patient continues to abuse  tobacco, reporting that she has recently switched from cigarettes to vaper pen.   ED Course: Upon arrival to the ED, patient is found to be saturating 88% on room air, afebrile, and with remaining vital signs stable. EKG features a sinus rhythm and troponin is within the normal limits. Chest x-ray demonstrates increased vascular pedicle width and diffuse bilateral airspace opacities suggestive of CHF. Chemistry panel features a bicarbonate of 35 but is otherwise unremarkable. CBC returned normal. Patient was treated with DuoNeb's, 125 mg IV Solu-Medrol, and 40 mg IV Lasix in the emergency department. Her O2 saturations normalized with the administration of 2 L/m supplemental oxygen and she remained hemodynamically stable. She'll be admitted to the telemetry unit for ongoing evaluation and management of acute hypoxic respiratory failure suspected to be multifactorial with contributions from COPD, morbid obesity, and possible CHF.  Hospital Course:   Acute hypoxemic respiratory failure -Likely due to COPD exacerbation, unlikely that acute CHF is contributing. -Provide oxygen supplementation as needed, will likely need home oxygen upon discharge.  COPD with acute exacerbation -Counseled on smoking cessation. -Much improved, will discharge home today on steroid taper, nebs.  Chronic diastolic CHF -2-D echo: Left ventricle: The cavity size was normal. Wall thickness was  increased in a pattern of mild LVH. Systolic function was normal.  The estimated ejection fraction was in the range of 60% to 65%.  Features are consistent with a pseudonormal left ventricular  filling pattern, with concomitant abnormal relaxation and  increased filling pressure (grade 2 diastolic dysfunction). -Do not believe acute CHF is playing a role in her shortness of breath.  Morbid obesity -Noted.  Tobacco abuse -  Counseled on cessation.  Hypothyroidism -Continue Synthroid  Type 2 diabetes mellitus -Fair  control, continue to adjust regimen as needed  Hypertension -Well-controlled  Procedures:  None   Consultations:  None  Discharge Instructions  Discharge Instructions    Diet - low sodium heart healthy    Complete by:  As directed      For home use only DME oxygen    Complete by:  As directed   Mode or (Route):  Nasal cannula  Liters per Minute:  2  Oxygen delivery system:  Gas     Increase activity slowly    Complete by:  As directed             Medication List    STOP taking these medications        dexamethasone 4 MG tablet  Commonly known as:  DECADRON     doxycycline 100 MG capsule  Commonly known as:  VIBRAMYCIN     HYDROcodone-acetaminophen 5-325 MG tablet  Commonly known as:  NORCO/VICODIN     ibuprofen 200 MG tablet  Commonly known as:  ADVIL,MOTRIN     naproxen 500 MG tablet  Commonly known as:  NAPROSYN     potassium chloride 10 MEQ tablet  Commonly known as:  K-DUR      TAKE these medications        albuterol 108 (90 Base) MCG/ACT inhaler  Commonly known as:  PROVENTIL HFA;VENTOLIN HFA  Inhale 2 puffs into the lungs every 6 (six) hours as needed for wheezing or shortness of breath.     amLODipine 10 MG tablet  Commonly known as:  NORVASC  Take 10 mg by mouth daily. Reported on 11/17/2015     aspirin EC 81 MG tablet  Take 81 mg by mouth daily. Reported on 11/17/2015     budesonide-formoterol 160-4.5 MCG/ACT inhaler  Commonly known as:  SYMBICORT  Inhale 2 puffs into the lungs 2 (two) times daily.     diclofenac 75 MG EC tablet  Commonly known as:  VOLTAREN  Take 1 tablet (75 mg total) by mouth 2 (two) times daily as needed for moderate pain.     DULoxetine 60 MG capsule  Commonly known as:  CYMBALTA  Take 60 mg by mouth daily. Reported on 11/17/2015     furosemide 20 MG tablet  Commonly known as:  LASIX  Take 1 tablet (20 mg total) by mouth daily.     ipratropium-albuterol 0.5-2.5 (3) MG/3ML Soln  Commonly known as:  DUONEB    Take 3 mLs by nebulization every 4 (four) hours as needed (wheezing).     levothyroxine 50 MCG tablet  Commonly known as:  SYNTHROID, LEVOTHROID  Take 50 mcg by mouth daily before breakfast. Reported on 11/17/2015     lisinopril-hydrochlorothiazide 20-12.5 MG tablet  Commonly known as:  PRINZIDE,ZESTORETIC  Take 1 tablet by mouth daily. Reported on 11/17/2015     loratadine 10 MG tablet  Commonly known as:  CLARITIN  Take 10 mg by mouth daily.     metFORMIN 500 MG tablet  Commonly known as:  GLUCOPHAGE  Take 500 mg by mouth at bedtime. Reported on 11/17/2015     methocarbamol 500 MG tablet  Commonly known as:  ROBAXIN  Take 1 tablet (500 mg total) by mouth 3 (three) times daily.     predniSONE 10 MG tablet  Commonly known as:  DELTASONE  Take 1 tablet (10 mg total) by mouth daily with breakfast. Take 6 tablets today  and then decrease by 1 tablet daily until none are left.     rosuvastatin 10 MG tablet  Commonly known as:  CRESTOR  Take 10 mg by mouth daily. Reported on 11/17/2015       Allergies  Allergen Reactions  . Bee Venom Swelling       Follow-up Information    [facility] Go on 12/09/2015.   Why:  Appt at 10 AM be 30 minutes early.    Contact information:   [phone]       The results of significant diagnostics from this hospitalization (including imaging, microbiology, ancillary and laboratory) are listed below for reference.    Significant Diagnostic Studies: Dg Chest 2 View  11/17/2015  CLINICAL DATA:  Increasing shortness of breath since Saturday. Hypoxia. EXAM: CHEST  2 VIEW COMPARISON:  10/15/2015 FINDINGS: Chronic cardiopericardial enlargement. There is vascular pedicle widening and diffuse interstitial opacity. No effusion or pneumothorax. No acute osseous finding. IMPRESSION: CHF pattern Electronically Signed   By: Marnee SpringJonathon  Watts M.D.   On: 11/17/2015 13:59   Dg Shoulder Right  11/09/2015  CLINICAL DATA:  Right shoulder  pain for 1-1/2 weeks EXAM: RIGHT SHOULDER - 2+ VIEW COMPARISON:  None. FINDINGS: No fracture. Humeral head appears somewhat lateral and superior which can be seen with posterior dislocation. Other possibilities include positioning FX or chronic rotator cuff tear. IMPRESSION: Cannot exclude posterior dislocation. Study limited without axillary view. Other possibilities include chronic rotator cuff tear.Consider CT in the acute setting if posterior dislocation is a concern. Otherwise, consider MRI to evaluate rotator cuff tendons. Electronically Signed   By: Esperanza Heiraymond  Rubner M.D.   On: 11/09/2015 16:01   Ct Shoulder Right Wo Contrast  11/09/2015  CLINICAL DATA:  Right shoulder pain and locking after pulling injury 1 week ago. EXAM: CT OF THE RIGHT SHOULDER WITHOUT CONTRAST TECHNIQUE: Multidetector CT imaging was performed according to the standard protocol. Multiplanar CT image reconstructions were also generated. COMPARISON:  Radiographs same date. FINDINGS: Study is mildly limited by body habitus. Bones: The humeral head is located. There is no evidence of acute fracture or dislocation. The subacromial space of the right shoulder is significantly narrowed, consistent with a chronic rotator cuff tear. Joint/cartilage: No evidence of large glenohumeral joint effusion or significant arthropathy. There are mild acromioclavicular degenerative changes. Ligaments: Not applicable for exam/indication. Tendon/muscles: As above, the subacromial space is narrowed. There is significant muscular atrophy of the subscapularis, supraspinatus and infraspinatus muscles, supporting the presence of chronic rotator cuff tear. Neurovascular/other soft tissues: No other significant findings. IMPRESSION: 1. No evidence of acute fracture or dislocation. The humeral head is located. 2. Chronic rotator cuff tear with narrowing of the subacromial space of the right shoulder. Electronically Signed   By: Carey BullocksWilliam  Veazey M.D.   On: 11/09/2015  18:11    Microbiology: No results found for this or any previous visit (from the past 240 hour(s)).   Labs: Basic Metabolic Panel:  Recent Labs Lab 11/17/15 1310 11/18/15 0436 11/19/15 0548 11/20/15 0457  NA 141 140 139 139  K 4.0 4.0 4.2 4.4  CL 100* 97* 98* 99*  CO2 35* 34* 34* 33*  GLUCOSE 95 172* 150* 144*  BUN 15 15 25* 32*  CREATININE 0.63 0.53 0.58 0.76  CALCIUM 8.9 9.2 8.8* 8.7*   Liver Function Tests:  Recent Labs Lab 11/17/15 1310  AST 24  ALT 49  ALKPHOS 63  BILITOT 0.5  PROT 7.0  ALBUMIN 3.9  No results for input(s): LIPASE, AMYLASE in the last 168 hours. No results for input(s): AMMONIA in the last 168 hours. CBC:  Recent Labs Lab 11/17/15 1310  WBC 7.0  NEUTROABS 4.9  HGB 12.8  HCT 42.6  MCV 94.5  PLT 242   Cardiac Enzymes:  Recent Labs Lab 11/17/15 1310  TROPONINI 0.03   BNP: BNP (last 3 results)  Recent Labs  09/24/15 1411 10/15/15 2212 11/17/15 1310  BNP 74.0 46.0 100.0    ProBNP (last 3 results) No results for input(s): PROBNP in the last 8760 hours.  CBG:  Recent Labs Lab 11/19/15 1117 11/19/15 1652 11/20/15 0718 11/20/15 1119 11/20/15 1626  GLUCAP 185* 152* 154* 226* 157*       Signed:  HERNANDEZ ACOSTA,Namira Rosekrans  [facility] Pager: [phone] 11/20/2015, 4:48 PM

## 2015-11-20 NOTE — Care Management Note (Signed)
Case Management Note  Patient Details  Name: Marie West MRN: [id number] Date of Birth: [birth year]  Subjective/Objective:    Patient set up woith FU at [facility] in [place] for 21 JUN 17 at 10 AM.  Match voucher given for discharge medications. [facility] will see for indigent O2.               Action/Plan: Home with home O2   Expected Discharge Date:                  Expected Discharge Plan:  Home/Self Care  In-House Referral:     Discharge planning Services  CM Consult, Medication Assistance, Homebound not met per provider, [place] Acute Care Choice:    Choice offered to:     DME Arranged:    DME Agency:     HH Arranged:    HH Agency:     Status of Service:  In process, will continue to follow  Medicare Important Message Given:    Date Medicare IM Given:    Medicare IM give by:    Date Additional Medicare IM Given:    Additional Medicare Important Message give by:     If discussed at [place] of Stay Meetings, dates discussed:    Additional Comments:  Alvie Heidelberg, RN 11/20/2015, 12:38 PM

## 2015-11-23 LAB — GLUCOSE, CAPILLARY: Glucose-Capillary: 240 mg/dL — ABNORMAL HIGH (ref 65–99)

## 2015-11-30 ENCOUNTER — Other Ambulatory Visit (HOSPITAL_COMMUNITY): Payer: Self-pay | Admitting: Respiratory Therapy

## 2015-11-30 DIAGNOSIS — G4710 Hypersomnia, unspecified: Secondary | ICD-10-CM

## 2015-11-30 DIAGNOSIS — R0683 Snoring: Secondary | ICD-10-CM

## 2015-11-30 DIAGNOSIS — R5383 Other fatigue: Secondary | ICD-10-CM

## 2015-11-30 DIAGNOSIS — G4700 Insomnia, unspecified: Secondary | ICD-10-CM

## 2015-12-29 ENCOUNTER — Ambulatory Visit: Payer: Self-pay | Attending: Internal Medicine | Admitting: Neurology

## 2015-12-29 DIAGNOSIS — G4733 Obstructive sleep apnea (adult) (pediatric): Secondary | ICD-10-CM | POA: Insufficient documentation

## 2015-12-29 DIAGNOSIS — R0683 Snoring: Secondary | ICD-10-CM | POA: Insufficient documentation

## 2015-12-29 DIAGNOSIS — Z7984 Long term (current) use of oral hypoglycemic drugs: Secondary | ICD-10-CM | POA: Insufficient documentation

## 2015-12-29 DIAGNOSIS — G4719 Other hypersomnia: Secondary | ICD-10-CM | POA: Insufficient documentation

## 2015-12-29 DIAGNOSIS — G4700 Insomnia, unspecified: Secondary | ICD-10-CM | POA: Insufficient documentation

## 2015-12-29 DIAGNOSIS — Z79899 Other long term (current) drug therapy: Secondary | ICD-10-CM | POA: Insufficient documentation

## 2015-12-29 DIAGNOSIS — R5383 Other fatigue: Secondary | ICD-10-CM | POA: Insufficient documentation

## 2015-12-29 DIAGNOSIS — Z7982 Long term (current) use of aspirin: Secondary | ICD-10-CM | POA: Insufficient documentation

## 2015-12-29 DIAGNOSIS — G4710 Hypersomnia, unspecified: Secondary | ICD-10-CM

## 2016-01-02 NOTE — Procedures (Signed)
[place] A. Merlene Laughter, MD     [email or web address]             NOCTURNAL POLYSOMNOGRAPHY   LOCATION: [facility]  Patient Name: Marie West, Marie West Date: 12/29/2015 Gender: Female D.O.B: 08/02/1955 Age (years): 53 Referring Provider: Not Available Height (inches): 62 Interpreting Physician: Phillips Odor MD, ABSM Weight (lbs): 319 RPSGT: Rosebud Poles BMI: 58 MRN: [id number] Neck Size: 20.00 CLINICAL INFORMATION Sleep Study Type: Split Night CPAP Indication for sleep study: Excessive Daytime Sleepiness, Fatigue, Snoring Epworth Sleepiness Score: 14 SLEEP STUDY TECHNIQUE As per the AASM Manual for the Scoring of Sleep and Associated Events v2.3 (April 2016) with a hypopnea requiring 4% desaturations. The channels recorded and monitored were frontal, central and occipital EEG, electrooculogram (EOG), submentalis EMG (chin), nasal and oral airflow, thoracic and abdominal wall motion, anterior tibialis EMG, snore microphone, electrocardiogram, and pulse oximetry. Continuous positive airway pressure (CPAP) was initiated when the patient met split night criteria and was titrated according to treat sleep-disordered breathing. MEDICATIONS Medications taken by the patient : N/A Medications administered by patient during sleep study : No sleep medicine administered.  Current outpatient prescriptions:  .  albuterol (PROVENTIL HFA;VENTOLIN HFA) 108 (90 BASE) MCG/ACT inhaler, Inhale 2 puffs into the lungs every 6 (six) hours as needed for wheezing or shortness of breath., Disp: 1 Inhaler, Rfl: 2 .  amLODipine (NORVASC) 10 MG tablet, Take 10 mg by mouth daily. Reported on 11/17/2015, Disp: , Rfl:  .  aspirin EC 81 MG tablet, Take 81 mg by mouth daily. Reported on 11/17/2015, Disp: , Rfl:  .  budesonide-formoterol (SYMBICORT) 160-4.5 MCG/ACT inhaler, Inhale 2 puffs into the lungs 2 (two) times daily., Disp: 1 Inhaler, Rfl: 12 .  diclofenac (VOLTAREN) 75 MG EC tablet, Take 1  tablet (75 mg total) by mouth 2 (two) times daily as needed for moderate pain., Disp: 45 tablet, Rfl: 0 .  DULoxetine (CYMBALTA) 60 MG capsule, Take 60 mg by mouth daily. Reported on 11/17/2015, Disp: , Rfl:  .  furosemide (LASIX) 20 MG tablet, Take 1 tablet (20 mg total) by mouth daily., Disp: 30 tablet, Rfl: 0 .  ipratropium-albuterol (DUONEB) 0.5-2.5 (3) MG/3ML SOLN, Take 3 mLs by nebulization every 4 (four) hours as needed (wheezing)., Disp: 360 mL, Rfl: 0 .  levothyroxine (SYNTHROID, LEVOTHROID) 50 MCG tablet, Take 50 mcg by mouth daily before breakfast. Reported on 11/17/2015, Disp: , Rfl:  .  lisinopril-hydrochlorothiazide (PRINZIDE,ZESTORETIC) 20-12.5 MG per tablet, Take 1 tablet by mouth daily. Reported on 11/17/2015, Disp: , Rfl:  .  loratadine (CLARITIN) 10 MG tablet, Take 10 mg by mouth daily., Disp: , Rfl:  .  metFORMIN (GLUCOPHAGE) 500 MG tablet, Take 500 mg by mouth at bedtime. Reported on 11/17/2015, Disp: , Rfl:  .  methocarbamol (ROBAXIN) 500 MG tablet, Take 1 tablet (500 mg total) by mouth 3 (three) times daily. (Patient not taking: Reported on 10/15/2015), Disp: 21 tablet, Rfl: 0 .  predniSONE (DELTASONE) 10 MG tablet, Take 1 tablet (10 mg total) by mouth daily with breakfast. Take 6 tablets today and then decrease by 1 tablet daily until none are left., Disp: 21 tablet, Rfl: 0 .  rosuvastatin (CRESTOR) 10 MG tablet, Take 10 mg by mouth daily. Reported on 11/17/2015, Disp: , Rfl:   RESPIRATORY PARAMETERS Diagnostic Total AHI (/hr): 72.1 RDI (/hr): 72.1 OA Index (/hr): 25.8 CA Index (/hr): 0.0 REM AHI (/hr): N/A NREM AHI (/hr): 72.1 Supine AHI (/hr): 30.0 Non-supine AHI (/hr): 76.06 Min O2 Sat (%): 67.00  Mean O2 (%): 91.00 Time below 88% (min): 32.5     Titration: Titrated between 4-14. Optimal Pressure (cm): 14 AHI at Optimal Pressure (/hr): 0.6 Min O2 at Optimal Pressure (%): 88.00 Supine % at Optimal (%): N/A Sleep % at Optimal (%): N/A     SLEEP ARCHITECTURE The recording time  for the entire night was 445.4 minutes. During a baseline period of 156.0 minutes, the patient slept for 116.5 minutes in REM and nonREM, yielding a sleep efficiency of 74.7%. Sleep onset after lights out was 4.3 minutes with a REM latency of N/A minutes. The patient spent 16.74% of the night in stage N1 sleep, 83.26% in stage N2 sleep, 0.00% in stage N3 and 0.00% in REM. During the titration period of 282.8 minutes, the patient slept for 248.6 minutes in REM and nonREM, yielding a sleep efficiency of 87.9%. Sleep onset after CPAP initiation was 8.7 minutes with a REM latency of 43.0 minutes. The patient spent 5.43% of the night in stage N1 sleep, 25.14% in stage N2 sleep, 16.29% in stage N3 and 53.13% in REM. CARDIAC DATA The 2 lead EKG demonstrated sinus rhythm. The mean heart rate was N/A beats per minute. Other EKG findings include: None. LEG MOVEMENT DATA The total Periodic Limb Movements of Sleep (PLMS) were 0. The PLMS index was 0.00.   IMPRESSIONS - Severe obstructive sleep apnea occurred during the diagnostic portion of the study (AHI = 72.1/hour). Optimal CPAP 14.    Delano Metz, MD Diplomate, American Board of Sleep Medicine.

## 2016-05-27 ENCOUNTER — Other Ambulatory Visit (HOSPITAL_COMMUNITY): Payer: Self-pay | Admitting: Respiratory Therapy

## 2016-05-27 DIAGNOSIS — Z9981 Dependence on supplemental oxygen: Secondary | ICD-10-CM

## 2016-05-27 DIAGNOSIS — J449 Chronic obstructive pulmonary disease, unspecified: Secondary | ICD-10-CM

## 2016-06-01 ENCOUNTER — Ambulatory Visit (HOSPITAL_COMMUNITY): Admission: RE | Admit: 2016-06-01 | Payer: Self-pay | Source: Ambulatory Visit

## 2016-06-29 ENCOUNTER — Inpatient Hospital Stay (HOSPITAL_COMMUNITY): Admission: RE | Admit: 2016-06-29 | Payer: Self-pay | Source: Ambulatory Visit

## 2016-09-16 ENCOUNTER — Encounter (HOSPITAL_COMMUNITY): Payer: Self-pay | Admitting: Emergency Medicine

## 2016-09-16 ENCOUNTER — Emergency Department (HOSPITAL_COMMUNITY)
Admission: EM | Admit: 2016-09-16 | Discharge: 2016-09-16 | Disposition: A | Discharge status: Home / Self Care | Payer: Medicaid Other | Attending: Emergency Medicine | Admitting: Emergency Medicine

## 2016-09-16 ENCOUNTER — Emergency Department (HOSPITAL_COMMUNITY): Payer: Medicaid Other

## 2016-09-16 DIAGNOSIS — Z79899 Other long term (current) drug therapy: Secondary | ICD-10-CM | POA: Diagnosis not present

## 2016-09-16 DIAGNOSIS — Y929 Unspecified place or not applicable: Secondary | ICD-10-CM | POA: Diagnosis not present

## 2016-09-16 DIAGNOSIS — Y999 Unspecified external cause status: Secondary | ICD-10-CM | POA: Insufficient documentation

## 2016-09-16 DIAGNOSIS — I509 Heart failure, unspecified: Secondary | ICD-10-CM | POA: Diagnosis not present

## 2016-09-16 DIAGNOSIS — Z7982 Long term (current) use of aspirin: Secondary | ICD-10-CM | POA: Diagnosis not present

## 2016-09-16 DIAGNOSIS — F17210 Nicotine dependence, cigarettes, uncomplicated: Secondary | ICD-10-CM | POA: Diagnosis not present

## 2016-09-16 DIAGNOSIS — Z7984 Long term (current) use of oral hypoglycemic drugs: Secondary | ICD-10-CM | POA: Diagnosis not present

## 2016-09-16 DIAGNOSIS — W08XXXA Fall from other furniture, initial encounter: Secondary | ICD-10-CM | POA: Insufficient documentation

## 2016-09-16 DIAGNOSIS — I110 Hypertensive heart disease with heart failure: Secondary | ICD-10-CM | POA: Insufficient documentation

## 2016-09-16 DIAGNOSIS — S2241XA Multiple fractures of ribs, right side, initial encounter for closed fracture: Secondary | ICD-10-CM | POA: Insufficient documentation

## 2016-09-16 DIAGNOSIS — S299XXA Unspecified injury of thorax, initial encounter: Secondary | ICD-10-CM | POA: Diagnosis present

## 2016-09-16 DIAGNOSIS — S8002XA Contusion of left knee, initial encounter: Secondary | ICD-10-CM | POA: Insufficient documentation

## 2016-09-16 DIAGNOSIS — Y9389 Activity, other specified: Secondary | ICD-10-CM | POA: Diagnosis not present

## 2016-09-16 DIAGNOSIS — J449 Chronic obstructive pulmonary disease, unspecified: Secondary | ICD-10-CM | POA: Diagnosis not present

## 2016-09-16 MED ORDER — OXYCODONE-ACETAMINOPHEN 5-325 MG PO TABS: 1.0000 | ORAL_TABLET | Freq: Three times a day (TID) | ORAL | 0 refills | Status: DC | PRN

## 2016-09-16 MED ORDER — OXYCODONE-ACETAMINOPHEN 5-325 MG PO TABS
1.0000 | ORAL_TABLET | Freq: Once | ORAL | Status: AC
  Administered 2016-09-16: 1 via ORAL
  Filled 2016-09-16: qty 1

## 2016-09-16 MED ORDER — HYDROMORPHONE HCL 1 MG/ML IJ SOLN
1.0000 mg | Freq: Once | INTRAMUSCULAR | Status: AC
  Administered 2016-09-16: 1 mg via INTRAVENOUS
  Filled 2016-09-16: qty 1

## 2016-09-16 MED ORDER — DOCUSATE SODIUM 100 MG PO CAPS: 100.0000 mg | ORAL_CAPSULE | Freq: Two times a day (BID) | ORAL | 0 refills | Status: DC

## 2016-09-16 MED ORDER — IBUPROFEN 800 MG PO TABS: 400.0000 mg | ORAL_TABLET | Freq: Four times a day (QID) | ORAL | 0 refills | Status: AC

## 2016-09-16 MED ORDER — OXYCODONE-ACETAMINOPHEN 5-325 MG PO TABS: 1.0000 | ORAL_TABLET | ORAL | 0 refills | Status: DC | PRN

## 2016-09-16 MED ORDER — HYDROMORPHONE HCL 1 MG/ML IJ SOLN
1.0000 mg | Freq: Once | INTRAMUSCULAR | Status: AC
  Administered 2016-09-16: 1 mg via INTRAMUSCULAR
  Filled 2016-09-16: qty 1

## 2016-09-16 NOTE — ED Notes (Signed)
Dilaudid given IM

## 2016-09-16 NOTE — ED Triage Notes (Signed)
Patient states she fell of her porch this morning when railing broke. Pt complains of pain in right flank and left leg.

## 2016-09-16 NOTE — Discharge Instructions (Signed)
You have rib fracture(s). Your natural tendency with these is to decrease the size of breath he take. It is thought that this increases her chances of developing pneumonia. This is why we have given you an incentive spirometer. At least 3 or 4 times an hour while you're awake please use this taking 1-2 deep breaths each time. This can be painful but helps keep you long open it and reduce the chance of having pneumonia.

## 2016-09-16 NOTE — ED Provider Notes (Signed)
[facility]Y DEPT Provider Note   CSN: [id number] Arrival date & time: 09/16/16  4098   By signing my name below, I, Bobbie Stack, attest that this documentation has been prepared under the direction and in the presence of Marily Memos, MD. Electronically Signed: Bobbie Stack, Scribe. 09/16/16. 2:05 PM. History   Chief Complaint Chief Complaint  Patient presents with  . Fall  . Flank Pain    The history is provided by the patient. No language interpreter was used.  HPI Comments: GENIENE LIST is a 61 y.o. female who presents to the Emergency Department complaining of right sided chest pain s/p fall that occurred PTA. The patient states that she was walking her dog outside onto the porch when her dog took off and she fell through the railing onto the ground. She states that her porch is around 3 feet high. She reports right chest and left leg pain currently. She denies LOC and SOB. She doesn't report taking anything prior to her arrival.Pain worse with breathing and palpation. Patient states it's worse if she lays on that side. Nothing makes it better besides staying still uncertain positions. No history of broken ribs. Does not feel particularly short of breath and no pain on her side.  Past Medical History:  Diagnosis Date  . Acute respiratory failure (HCC)   . Arthritis   . Bronchitis   . CHF (congestive heart failure) (HCC)   . COPD with exacerbation (HCC)   . Fibromyalgia   . Headache(784.0)   . Hypertension   . Hypertensive urgency   . Hyperthyroidism   . MRSA infection   . Pneumonia   . Retinal detachment   . Sciatic leg pain   . Shortness of breath     Patient Active Problem List   Diagnosis Date Noted  . Acute dyspnea 11/17/2015  . Depression 11/17/2015  . Hypertension 11/17/2015  . Non-insulin dependent type 2 diabetes mellitus (HCC) 11/17/2015  . Hypothyroidism 11/17/2015  . Acute exacerbation of chronic obstructive pulmonary disease (COPD) (HCC)     . Hypoxia   . CAP (community acquired pneumonia) 07/09/2013  . Acute respiratory failure (HCC) 07/09/2013  . Pleural effusion 07/09/2013  . Hypertensive urgency 07/09/2013  . Acute respiratory failure with hypoxia (HCC) 07/09/2013  . COPD with exacerbation (HCC) 06/01/2012  . Tobacco abuse 06/01/2012  . Morbid obesity (HCC) 06/01/2012  . Arthritis 06/01/2012  . possible CHF (congestive heart failure) 06/01/2012    Past Surgical History:  Procedure Laterality Date  . KNEE SURGERY    . TUBAL LIGATION      OB History    No data available       Home Medications    Prior to Admission medications   Medication Sig Start Date End Date Taking? Authorizing Provider  albuterol (PROVENTIL HFA;VENTOLIN HFA) 108 (90 BASE) MCG/ACT inhaler Inhale 2 puffs into the lungs every 6 (six) hours as needed for wheezing or shortness of breath. 07/11/13  Yes Lesle Chris Black, NP  amLODipine (NORVASC) 10 MG tablet Take 10 mg by mouth daily. Reported on 11/17/2015   Yes Historical Provider, MD  aspirin EC 81 MG tablet Take 81 mg by mouth daily. Reported on 11/17/2015   Yes Historical Provider, MD  budesonide-formoterol (SYMBICORT) 160-4.5 MCG/ACT inhaler Inhale 2 puffs into the lungs 2 (two) times daily. 07/11/13  Yes Lesle Chris Black, NP  DULoxetine (CYMBALTA) 60 MG capsule Take 60 mg by mouth daily. Reported on 11/17/2015   Yes Historical Provider, MD  ipratropium-albuterol (  DUONEB) 0.5-2.5 (3) MG/3ML SOLN Take 3 mLs by nebulization every 4 (four) hours as needed (wheezing). 09/24/15  Yes Lavera Guise, MD  levothyroxine (SYNTHROID, LEVOTHROID) 50 MCG tablet Take 50 mcg by mouth daily before breakfast. Reported on 11/17/2015   Yes Historical Provider, MD  lisinopril-hydrochlorothiazide (PRINZIDE,ZESTORETIC) 20-12.5 MG per tablet Take 1 tablet by mouth daily. Reported on 11/17/2015   Yes Historical Provider, MD  loratadine (CLARITIN) 10 MG tablet Take 10 mg by mouth daily.   Yes Historical Provider, MD  Melatonin 10 MG  CAPS Take 1 capsule by mouth at bedtime.   Yes Historical Provider, MD  metFORMIN (GLUCOPHAGE) 500 MG tablet Take 500 mg by mouth at bedtime. Reported on 11/17/2015   Yes Historical Provider, MD  ZETIA 10 MG tablet Take 1 tablet by mouth daily. 09/02/16  Yes Historical Provider, MD  docusate sodium (COLACE) 100 MG capsule Take 1 capsule (100 mg total) by mouth every 12 (twelve) hours. While on pain medication. 09/16/16   Marily Memos, MD  ibuprofen (ADVIL,MOTRIN) 800 MG tablet Take 0.5 tablets (400 mg total) by mouth 4 (four) times daily. 09/16/16 09/26/16  Marily Memos, MD  oxyCODONE-acetaminophen (PERCOCET/ROXICET) 5-325 MG tablet Take 1-2 tablets by mouth every 4 (four) hours as needed for severe pain. 09/16/16   Marily Memos, MD  oxyCODONE-acetaminophen (PERCOCET/ROXICET) 5-325 MG tablet Take 1-2 tablets by mouth every 8 (eight) hours as needed for severe pain. 09/16/16   Marily Memos, MD    Family History Family History  Problem Relation Age of Onset  . Diabetes Mother   . Heart disease Mother     died AMI, age 33  . Heart disease Brother   . Diabetes Sister     Social History Social History  Substance Use Topics  . Smoking status: Current Some Day Smoker    Packs/day: 0.50    Years: 36.00    Types: Cigarettes  . Smokeless tobacco: Never Used  . Alcohol use Yes     Comment: occ-wine     Allergies   Bee venom   Review of Systems Review of Systems  Respiratory: Negative for shortness of breath.   Cardiovascular: Positive for chest pain.  Gastrointestinal: Negative for abdominal pain, nausea and vomiting.  Musculoskeletal: Positive for myalgias. Negative for back pain.       Left leg pain  Neurological: Negative for dizziness, syncope and headaches.  All other systems reviewed and are negative.     Physical Exam Updated Vital Signs BP (!) 131/91 (BP Location: Right Arm)   Pulse 63   Temp 98.4 F (36.9 C) (Oral)   Resp 18   Ht  (1.6 m)   Wt (!) 320 lb (145.2 kg)    SpO2 93%   BMI 56.69 kg/m   Physical Exam  Constitutional: She is oriented to person, place, and time. She appears well-developed and well-nourished. No distress.  HENT:  Head: Normocephalic and atraumatic.  Eyes: EOM are normal.  Neck: Normal range of motion.  Cardiovascular: Normal rate, regular rhythm and normal heart sounds.   Pulmonary/Chest: Effort normal and breath sounds normal. She exhibits tenderness.  Tenderness in right upper lateral chest. Right posterior rib tenderness.  Abdominal: Soft. She exhibits no distension. There is no tenderness.  No abdominal tenderness.  Musculoskeletal: Normal range of motion.  Neurological: She is alert and oriented to person, place, and time.  Skin: Skin is warm and dry. Abrasion noted.  Small abrasion to left lower lateral leg. Echymotic area below  the left knee.  Psychiatric: She has a normal mood and affect. Judgment normal.  Nursing note and vitals reviewed.  ED Treatments / Results  DIAGNOSTIC STUDIES: Oxygen Saturation is 98% on RA, normal by my interpretation.    COORDINATION OF CARE: 1:41 PM Discussed treatment plan with pt at bedside and pt agreed to plan. I will check the patient's X-rays.  Labs (all labs ordered are listed, but only abnormal results are displayed) Labs Reviewed - No data to display  EKG  EKG Interpretation  Date/Time:  Friday September 16 2016 14:06:00 EDT Ventricular Rate:  61 PR Interval:    QRS Duration: 104 QT Interval:  477 QTC Calculation: 481 R Axis:   84 Text Interpretation:  Sinus rhythm Borderline right axis deviation Borderline T abnormalities, lateral leads Baseline wander in lead(s) II Confirmed by [facility] MD, Barbara Cower [phone]) on 09/16/2016 5:14:06 PM       Radiology Dg Chest 2 View  Result Date: 09/16/2016 CLINICAL DATA:  Initial evaluation for acute right anterior chest pain status post fall. History of CHF. EXAM: CHEST  2 VIEW COMPARISON:  Prior radiograph from 11/17/2015. FINDINGS:  Stable cardiomegaly.  Mediastinal silhouette within normal limits. Lungs hypoinflated. Diffuse mild diffuse pulmonary interstitial congestion and vascular prominence without frank pulmonary edema. No pleural effusion. No focal infiltrates. No pneumothorax. No acute osseous abnormality. IMPRESSION: Cardiomegaly with diffuse pulmonary vascular congestion without frank pulmonary edema. Electronically Signed   By: Rise Mu M.D.   On: 09/16/2016 12:48   Ct Chest Wo Contrast  Result Date: 09/16/2016 CLINICAL DATA:  Pt fell 3 feet landing face first, pain to right anterior ribs.Hx of CHF,COPD,HTN EXAM: CT CHEST WITHOUT CONTRAST TECHNIQUE: Multidetector CT imaging of the chest was performed following the standard protocol without IV contrast. COMPARISON:  Current chest radiograph FINDINGS: Cardiovascular: Heart is mildly enlarged. There are 3 vessel, fairly dense, coronary artery calcifications. The great vessels are normal in caliber. Atherosclerotic calcifications are noted along the aortic arch and at the origin of the arch branch vessels. Mediastinum/Nodes: No neck base, axillary, mediastinal or hilar masses or adenopathy. Trachea is patent. Esophagus is unremarkable. Lungs/Pleura: Mild subsegmental atelectasis noted in the right middle lobe and lateral base of the left lower lobe. Lungs otherwise clear. No pleural effusion. No pneumothorax. Upper Abdomen: No acute abnormality. Musculoskeletal: There are nondisplaced fractures of the anterior right third, fourth, fifth and probably sixth ribs. No other convincing fractures. No bone lesions. IMPRESSION: 1. Nondisplaced fractures of the anterior right third, fourth and fifth ribs and probably the sixth rib. No evidence of a fracture complication. Specifically, no lung contusion or pneumothorax. 2. No other acute abnormalities. Electronically Signed   By: Amie Portland M.D.   On: 09/16/2016 17:37    Procedures Procedures (including critical care  time)  Medications Ordered in ED Medications  HYDROmorphone (DILAUDID) injection 1 mg (not administered)  oxyCODONE-acetaminophen (PERCOCET/ROXICET) 5-325 MG per tablet 1 tablet (not administered)  HYDROmorphone (DILAUDID) injection 1 mg (1 mg Intravenous Given 09/16/16 1406)     Initial Impression / Assessment and Plan / ED Course  I have reviewed the triage vital signs and the nursing notes.  Pertinent labs & imaging results that were available during my care of the patient were reviewed by me and considered in my medical decision making (see chart for details).     With mechanical fall as her dog pulled her off of a porch approximately 3-4 foot high. On her right side with subsequent right rib fractures. She  has history of smoking but no history of COPD that she knows of. No hypoxia or tachypnea to suggest that she will do well. No indication for admission this time, Plan for pain medication and incentive spirometer with PCP follow-up for further pain control.  Final Clinical Impressions(s) / ED Diagnoses   Final diagnoses:  Closed fracture of multiple ribs of right side, initial encounter    New Prescriptions New Prescriptions   DOCUSATE SODIUM (COLACE) 100 MG CAPSULE    Take 1 capsule (100 mg total) by mouth every 12 (twelve) hours. While on pain medication.   IBUPROFEN (ADVIL,MOTRIN) 800 MG TABLET    Take 0.5 tablets (400 mg total) by mouth 4 (four) times daily.   OXYCODONE-ACETAMINOPHEN (PERCOCET/ROXICET) 5-325 MG TABLET    Take 1-2 tablets by mouth every 4 (four) hours as needed for severe pain.   OXYCODONE-ACETAMINOPHEN (PERCOCET/ROXICET) 5-325 MG TABLET    Take 1-2 tablets by mouth every 8 (eight) hours as needed for severe pain.   I personally performed the services described in this documentation, which was scribed in my presence. The recorded information has been reviewed and is accurate.   Marily Memos, MD 09/16/16 2001

## 2016-09-28 MED FILL — Oxycodone w/ Acetaminophen Tab 5-325 MG: ORAL | Qty: 6 | Status: AC

## 2017-01-25 ENCOUNTER — Other Ambulatory Visit (HOSPITAL_COMMUNITY): Payer: Self-pay | Admitting: Respiratory Therapy

## 2017-01-25 DIAGNOSIS — J441 Chronic obstructive pulmonary disease with (acute) exacerbation: Secondary | ICD-10-CM

## 2017-02-01 ENCOUNTER — Ambulatory Visit (HOSPITAL_COMMUNITY)
Admission: RE | Admit: 2017-02-01 | Discharge: 2017-02-01 | Disposition: A | Discharge status: Home / Self Care | Payer: Medicaid Other | Source: Ambulatory Visit | Attending: Pulmonary Disease | Admitting: Pulmonary Disease

## 2017-02-01 ENCOUNTER — Encounter (HOSPITAL_COMMUNITY): Payer: Self-pay | Admitting: Cardiology

## 2017-02-01 ENCOUNTER — Emergency Department (HOSPITAL_COMMUNITY)
Admission: EM | Admit: 2017-02-01 | Discharge: 2017-02-01 | Disposition: A | Discharge status: Home / Self Care | Payer: Medicaid Other | Attending: Emergency Medicine | Admitting: Emergency Medicine

## 2017-02-01 ENCOUNTER — Encounter (HOSPITAL_BASED_OUTPATIENT_CLINIC_OR_DEPARTMENT_OTHER): Payer: Self-pay

## 2017-02-01 DIAGNOSIS — J441 Chronic obstructive pulmonary disease with (acute) exacerbation: Secondary | ICD-10-CM | POA: Insufficient documentation

## 2017-02-01 DIAGNOSIS — Z7982 Long term (current) use of aspirin: Secondary | ICD-10-CM | POA: Insufficient documentation

## 2017-02-01 DIAGNOSIS — F17210 Nicotine dependence, cigarettes, uncomplicated: Secondary | ICD-10-CM | POA: Diagnosis not present

## 2017-02-01 DIAGNOSIS — Z79899 Other long term (current) drug therapy: Secondary | ICD-10-CM | POA: Insufficient documentation

## 2017-02-01 DIAGNOSIS — G8929 Other chronic pain: Secondary | ICD-10-CM

## 2017-02-01 DIAGNOSIS — M25551 Pain in right hip: Secondary | ICD-10-CM | POA: Diagnosis present

## 2017-02-01 DIAGNOSIS — I509 Heart failure, unspecified: Secondary | ICD-10-CM | POA: Diagnosis not present

## 2017-02-01 DIAGNOSIS — E119 Type 2 diabetes mellitus without complications: Secondary | ICD-10-CM | POA: Insufficient documentation

## 2017-02-01 DIAGNOSIS — J449 Chronic obstructive pulmonary disease, unspecified: Secondary | ICD-10-CM | POA: Diagnosis not present

## 2017-02-01 DIAGNOSIS — M545 Low back pain: Secondary | ICD-10-CM | POA: Insufficient documentation

## 2017-02-01 DIAGNOSIS — I110 Hypertensive heart disease with heart failure: Secondary | ICD-10-CM | POA: Diagnosis not present

## 2017-02-01 DIAGNOSIS — G4733 Obstructive sleep apnea (adult) (pediatric): Secondary | ICD-10-CM

## 2017-02-01 DIAGNOSIS — Z7984 Long term (current) use of oral hypoglycemic drugs: Secondary | ICD-10-CM | POA: Diagnosis not present

## 2017-02-01 MED ORDER — ALBUTEROL SULFATE (2.5 MG/3ML) 0.083% IN NEBU
2.5000 mg | INHALATION_SOLUTION | Freq: Once | RESPIRATORY_TRACT | Status: AC
  Administered 2017-02-01: 2.5 mg via RESPIRATORY_TRACT

## 2017-02-01 MED ORDER — HYDROCODONE-ACETAMINOPHEN 5-325 MG PO TABS
1.0000 | ORAL_TABLET | Freq: Once | ORAL | Status: AC
  Administered 2017-02-01: 1 via ORAL
  Filled 2017-02-01: qty 1

## 2017-02-01 MED ORDER — DEXAMETHASONE SODIUM PHOSPHATE 4 MG/ML IJ SOLN
8.0000 mg | Freq: Once | INTRAMUSCULAR | Status: AC
  Administered 2017-02-01: 8 mg via INTRAMUSCULAR
  Filled 2017-02-01: qty 2

## 2017-02-01 MED ORDER — MELOXICAM 7.5 MG PO TABS: 7.5000 mg | ORAL_TABLET | Freq: Every day | ORAL | 0 refills | Status: DC

## 2017-02-01 NOTE — Discharge Instructions (Signed)
Please read instructions below. Apply ice to your hip for 20 minutes at a time. You can take mobic daily with meals as needed for pain. Schedule an appointment with the orthopedic specialist follow-up on your chronic hip pain. Return to the ER for new or concerning symptoms.

## 2017-02-01 NOTE — ED Triage Notes (Signed)
Right hip pain for a while,  Worse last night.  Denies any new injury

## 2017-02-01 NOTE — ED Provider Notes (Signed)
[facility] Provider Note   CSN: [id number] Arrival date & time: 02/01/17  1356     History   Chief Complaint Chief Complaint  Patient presents with  . Hip Pain    HPI Marie West is a 61 y.o. female w PMHx CHF, right hip arthritis, sciatica, fibromyalgia, COPD, T2DM, presenting with acute on chronic right hip pain that worsened last night. Pt localized pain to right groin and lateral hip, worse with ambulation. Assoc mild low back pain. No new injuries, denies N/T, bowel or bladder incontinence, saddle paresthesia. Some improvement with advil. States she has gotten xrays on this hip in the past showing arthritis and has been waiting for orthopedic referral to be accepted by insurance. Per chart review, x-ray of right hip in March 2017 showing moderate to advanced arthritis.  The history is provided by the patient.    Past Medical History:  Diagnosis Date  . Acute respiratory failure (HCC)   . Arthritis   . Bronchitis   . CHF (congestive heart failure) (HCC)   . COPD with exacerbation (HCC)   . Fibromyalgia   . Headache(784.0)   . Hypertension   . Hypertensive urgency   . Hyperthyroidism   . MRSA infection   . Pneumonia   . Retinal detachment   . Sciatic leg pain   . Shortness of breath     Patient Active Problem List   Diagnosis Date Noted  . Acute dyspnea 11/17/2015  . Depression 11/17/2015  . Hypertension 11/17/2015  . Non-insulin dependent type 2 diabetes mellitus (HCC) 11/17/2015  . Hypothyroidism 11/17/2015  . Acute exacerbation of chronic obstructive pulmonary disease (COPD) (HCC)   . Hypoxia   . CAP (community acquired pneumonia) 07/09/2013  . Acute respiratory failure (HCC) 07/09/2013  . Pleural effusion 07/09/2013  . Hypertensive urgency 07/09/2013  . Acute respiratory failure with hypoxia (HCC) 07/09/2013  . COPD with exacerbation (HCC) 06/01/2012  . Tobacco abuse 06/01/2012  . Morbid obesity (HCC) 06/01/2012  . Arthritis 06/01/2012  .  possible CHF (congestive heart failure) 06/01/2012    Past Surgical History:  Procedure Laterality Date  . KNEE SURGERY    . TUBAL LIGATION      OB History    No data available       Home Medications    Prior to Admission medications   Medication Sig Start Date End Date Taking? Authorizing Provider  albuterol (PROVENTIL HFA;VENTOLIN HFA) 108 (90 BASE) MCG/ACT inhaler Inhale 2 puffs into the lungs every 6 (six) hours as needed for wheezing or shortness of breath. 07/11/13   Black, Lesle Chris, NP  amLODipine (NORVASC) 10 MG tablet Take 10 mg by mouth daily. Reported on 11/17/2015    [provider]  aspirin EC 81 MG tablet Take 81 mg by mouth daily. Reported on 11/17/2015    [provider]  budesonide-formoterol (SYMBICORT) 160-4.5 MCG/ACT inhaler Inhale 2 puffs into the lungs 2 (two) times daily. 07/11/13   Black, Lesle Chris, NP  docusate sodium (COLACE) 100 MG capsule Take 1 capsule (100 mg total) by mouth every 12 (twelve) hours. While on pain medication. 09/16/16   Mesner, Barbara Cower, MD  DULoxetine (CYMBALTA) 60 MG capsule Take 60 mg by mouth daily. Reported on 11/17/2015    [provider]  ipratropium-albuterol (DUONEB) 0.5-2.5 (3) MG/3ML SOLN Take 3 mLs by nebulization every 4 (four) hours as needed (wheezing). 09/24/15   Lavera Guise, MD  levothyroxine (SYNTHROID, LEVOTHROID) 50 MCG tablet Take 50 mcg by mouth  daily before breakfast. Reported on 11/17/2015    [provider]  lisinopril-hydrochlorothiazide (PRINZIDE,ZESTORETIC) 20-12.5 MG per tablet Take 1 tablet by mouth daily. Reported on 11/17/2015    [provider]  loratadine (CLARITIN) 10 MG tablet Take 10 mg by mouth daily.    [provider]  Melatonin 10 MG CAPS Take 1 capsule by mouth at bedtime.    [provider]  metFORMIN (GLUCOPHAGE) 500 MG tablet Take 500 mg by mouth at bedtime. Reported on 11/17/2015    [provider]  oxyCODONE-acetaminophen  (PERCOCET/ROXICET) 5-325 MG tablet Take 1-2 tablets by mouth every 4 (four) hours as needed for severe pain. 09/16/16   Mesner, Barbara CowerJason, MD  oxyCODONE-acetaminophen (PERCOCET/ROXICET) 5-325 MG tablet Take 1-2 tablets by mouth every 8 (eight) hours as needed for severe pain. 09/16/16   Mesner, Barbara CowerJason, MD  ZETIA 10 MG tablet Take 1 tablet by mouth daily. 09/02/16   [provider]    Family History Family History  Problem Relation Age of Onset  . Diabetes Mother   . Heart disease Mother        died AMI, age 62  . Heart disease Brother   . Diabetes Sister     Social History Social History  Substance Use Topics  . Smoking status: Current Some Day Smoker    Packs/day: 0.50    Years: 36.00    Types: Cigarettes  . Smokeless tobacco: Never Used  . Alcohol use Yes     Comment: occ-wine     Allergies   Bee venom   Review of Systems Review of Systems  Constitutional: Negative for fever.  Gastrointestinal: Negative for abdominal pain.       No bowel incontinence  Genitourinary: Negative for difficulty urinating.  Musculoskeletal: Positive for arthralgias and back pain.  Neurological: Negative for weakness and numbness.     Physical Exam Updated Vital Signs BP (!) 108/59   Pulse 77   Temp 98.2 F (36.8 C) (Oral)   Resp 16   Ht 5\' 1"  (1.549 m)   Wt 136.1 kg (300 lb)   SpO2 92%   BMI 56.68 kg/m   Physical Exam  Constitutional: She appears well-developed and well-nourished.  Morbidly obese, not in distress  HENT:  Head: Normocephalic and atraumatic.  Eyes: Conjunctivae are normal.  Neck: Normal range of motion.  Cardiovascular: Normal rate and intact distal pulses.   Pulmonary/Chest: Effort normal.  Musculoskeletal:  No spinal or paraspinal tenderness. Tenderness right groin and right lateral hip. Normal range of motion. No gross deformities.  Neurological:  5/5 strength bilateral lower extremities. Sensation intact. Gait favoring right leg.  Psychiatric: She  has a normal mood and affect. Her behavior is normal.  Nursing note and vitals reviewed.    ED Treatments / Results  Labs (all labs ordered are listed, but only abnormal results are displayed) Labs Reviewed - No data to display  EKG  EKG Interpretation None       Radiology No results found.  Procedures Procedures (including critical care time)  Medications Ordered in ED Medications  dexamethasone (DECADRON) injection 8 mg (8 mg Intramuscular Given 02/01/17 1530)  HYDROcodone-acetaminophen (NORCO/VICODIN) 5-325 MG per tablet 1 tablet (1 tablet Oral Given 02/01/17 1530)     Initial Impression / Assessment and Plan / ED Course  I have reviewed the triage vital signs and the nursing notes.  Pertinent labs & imaging results that were available during my care of the patient were reviewed by me and considered  in my medical decision making (see chart for details).     Patient with worsening chronic right hip pain. No new injuries. Neurovascularly intact. No red flags. Pain treated in ED with steroid and Norco. Will discharge with NSAIDs and symptomatic management. Orthopedic referral given. Patient is nondistressed, safe for discharge home.  Discussed results, findings, treatment and follow up. Patient advised of return precautions. Patient verbalized understanding and agreed with plan.   Final Clinical Impressions(s) / ED Diagnoses   Final diagnoses:  Chronic right hip pain    New Prescriptions New Prescriptions   No medications on file     Russo, [place] N, PA-C 02/01/17 1551    Benjiman Core, MD 02/02/17 [phone]

## 2017-02-02 LAB — PULMONARY FUNCTION TEST
DL/VA % pred: 126 %
DL/VA: 5.55 ml/min/mmHg/L
DLCO cor % pred: 101 %
DLCO cor: 20.5 ml/min/mmHg
DLCO unc % pred: 101 %
DLCO unc: 20.5 ml/min/mmHg
FEF 25-75 Post: 2.15 L/sec
FEF 25-75 Pre: 2.64 L/sec
FEF2575-%Change-Post: -18 %
FEF2575-%Pred-Post: 101 %
FEF2575-%Pred-Pre: 125 %
FEV1-%Change-Post: -3 %
FEV1-%Pred-Post: 75 %
FEV1-%Pred-Pre: 77 %
FEV1-Post: 1.68 L
FEV1-Pre: 1.74 L
FEV1FVC-%Change-Post: -1 %
FEV1FVC-%Pred-Pre: 111 %
FEV6-%Change-Post: -2 %
FEV6-%Pred-Post: 69 %
FEV6-%Pred-Pre: 71 %
FEV6-Post: 1.95 L
FEV6-Pre: 1.99 L
FEV6FVC-%Pred-Post: 104 %
FEV6FVC-%Pred-Pre: 104 %
FVC-%Change-Post: -2 %
FVC-%Pred-Post: 67 %
FVC-%Pred-Pre: 68 %
FVC-Post: 1.95 L
FVC-Pre: 1.99 L
Post FEV1/FVC ratio: 86 %
Post FEV6/FVC ratio: 100 %
Pre FEV1/FVC ratio: 87 %
Pre FEV6/FVC Ratio: 100 %
RV % pred: 112 %
RV: 2.1 L
TLC % pred: 87 %
TLC: 4.04 L

## 2017-02-26 ENCOUNTER — Ambulatory Visit: Payer: Medicaid Other | Attending: Pulmonary Disease | Admitting: Neurology

## 2017-02-26 DIAGNOSIS — G4733 Obstructive sleep apnea (adult) (pediatric): Secondary | ICD-10-CM | POA: Diagnosis not present

## 2017-03-04 NOTE — Procedures (Signed)
[facility] A. Merlene Laughter, MD     [email or web address]             NOCTURNAL POLYSOMNOGRAPHY   LOCATION: [facility]   Patient Name: Marie West, Marie West Date: 02/26/2017 Gender: Female D.O.B: March 13, 1956 Age (years): 60 Referring Provider: Sinda Du Height (inches): 62 Interpreting Physician: Phillips Odor MD, ABSM Weight (lbs): 319 RPSGT: Rosebud Poles BMI: 58 MRN: [id number] Neck Size: 17.50 CLINICAL INFORMATION Sleep Study Type: Split Night CPAP  Indication for sleep study: OSA  Epworth Sleepiness Score: 9  SLEEP STUDY TECHNIQUE As per the AASM Manual for the Scoring of Sleep and Associated Events v2.3 (April 2016) with a hypopnea requiring 4% desaturations.  The channels recorded and monitored were frontal, central and occipital EEG, electrooculogram (EOG), submentalis EMG (chin), nasal and oral airflow, thoracic and abdominal wall motion, anterior tibialis EMG, snore microphone, electrocardiogram, and pulse oximetry. Continuous positive airway pressure (CPAP) was initiated when the patient met split night criteria and was titrated according to treat sleep-disordered breathing.  MEDICATIONS Medications self-administered by patient taken the night of the study : N/A  Current Outpatient Prescriptions:  .  albuterol (PROVENTIL HFA;VENTOLIN HFA) 108 (90 BASE) MCG/ACT inhaler, Inhale 2 puffs into the lungs every 6 (six) hours as needed for wheezing or shortness of breath., Disp: 1 Inhaler, Rfl: 2 .  amLODipine (NORVASC) 10 MG tablet, Take 10 mg by mouth daily. Reported on 11/17/2015, Disp: , Rfl:  .  aspirin EC 81 MG tablet, Take 81 mg by mouth daily. Reported on 11/17/2015, Disp: , Rfl:  .  budesonide-formoterol (SYMBICORT) 160-4.5 MCG/ACT inhaler, Inhale 2 puffs into the lungs 2 (two) times daily., Disp: 1 Inhaler, Rfl: 12 .  docusate sodium (COLACE) 100 MG capsule, Take 1 capsule (100 mg total) by mouth every 12 (twelve) hours. While on pain medication.,  Disp: 60 capsule, Rfl: 0 .  DULoxetine (CYMBALTA) 60 MG capsule, Take 60 mg by mouth daily. Reported on 11/17/2015, Disp: , Rfl:  .  ipratropium-albuterol (DUONEB) 0.5-2.5 (3) MG/3ML SOLN, Take 3 mLs by nebulization every 4 (four) hours as needed (wheezing)., Disp: 360 mL, Rfl: 0 .  levothyroxine (SYNTHROID, LEVOTHROID) 50 MCG tablet, Take 50 mcg by mouth daily before breakfast. Reported on 11/17/2015, Disp: , Rfl:  .  lisinopril-hydrochlorothiazide (PRINZIDE,ZESTORETIC) 20-12.5 MG per tablet, Take 1 tablet by mouth daily. Reported on 11/17/2015, Disp: , Rfl:  .  loratadine (CLARITIN) 10 MG tablet, Take 10 mg by mouth daily., Disp: , Rfl:  .  Melatonin 10 MG CAPS, Take 1 capsule by mouth at bedtime., Disp: , Rfl:  .  meloxicam (MOBIC) 7.5 MG tablet, Take 1 tablet (7.5 mg total) by mouth daily., Disp: 15 tablet, Rfl: 0 .  metFORMIN (GLUCOPHAGE) 500 MG tablet, Take 500 mg by mouth at bedtime. Reported on 11/17/2015, Disp: , Rfl:  .  oxyCODONE-acetaminophen (PERCOCET/ROXICET) 5-325 MG tablet, Take 1-2 tablets by mouth every 4 (four) hours as needed for severe pain., Disp: 6 tablet, Rfl: 0 .  oxyCODONE-acetaminophen (PERCOCET/ROXICET) 5-325 MG tablet, Take 1-2 tablets by mouth every 8 (eight) hours as needed for severe pain., Disp: 30 tablet, Rfl: 0 .  ZETIA 10 MG tablet, Take 1 tablet by mouth daily., Disp: , Rfl: 5   RESPIRATORY PARAMETERS Diagnostic  Total AHI (/hr): 65.9 RDI (/hr): 65.9 OA Index (/hr): - CA Index (/hr): 0.0 REM AHI (/hr): N/A NREM AHI (/hr): 65.9 Supine AHI (/hr): N/A Non-supine AHI (/hr): 65.91 Min O2 Sat (%): 80.00 Mean O2 (%): 87.41 Time below 88% (  min): 89.3   Titration  Optimal Pressure (cm): 9 AHI at Optimal Pressure (/hr): 0.9 Min O2 at Optimal Pressure (%): 83.0 Supine % at Optimal (%): 0 Sleep % at Optimal (%): 88   SLEEP ARCHITECTURE The recording time for the entire night was 385.7 minutes.  During a baseline period of 156.1 minutes, the patient slept for 132.0  minutes in REM and nonREM, yielding a sleep efficiency of 84.5%. Sleep onset after lights out was 3.3 minutes with a REM latency of N/A minutes. The patient spent 15.15% of the night in stage N1 sleep, 75.76% in stage N2 sleep, 9.09% in stage N3 and 0.00% in REM.  During the titration period of 222.6 minutes, the patient slept for 187.5 minutes in REM and nonREM, yielding a sleep efficiency of 84.2%. Sleep onset after CPAP initiation was 5.7 minutes with a REM latency of 140.5 minutes. The patient spent 5.87% of the night in stage N1 sleep, 46.13% in stage N2 sleep, 30.67% in stage N3 and 17.33% in REM.  CARDIAC DATA The 2 lead EKG demonstrated sinus rhythm. The mean heart rate was N/A beats per minute. Other EKG findings include: PVCs. LEG MOVEMENT DATA The total Periodic Limb Movements of Sleep (PLMS) were 0. The PLMS index was 0.00.  IMPRESSIONS Severe obstructive sleep apnea occurred during the diagnostic portion of the study (AHI = 65.9/hour). An optimal CPAP pressure is selected for this patient ( 9 cm of water).   Delano Metz, MD Diplomate, American Board of Sleep Medicine.  ELECTRONICALLY SIGNED ON:  03/04/2017, 5:47 PM [place] PH: [phone]   FX: [phone] [place]

## 2017-08-18 IMAGING — DX DG HIP (WITH OR WITHOUT PELVIS) 2-3V*R*
3 series · 3 of 3 positions shown · non-contrast
Comparison: None.

CLINICAL DATA: Right hip pain.  No time course given.

EXAM:
DG HIP (WITH OR WITHOUT PELVIS) 2-3V RIGHT

[pelvis ap]
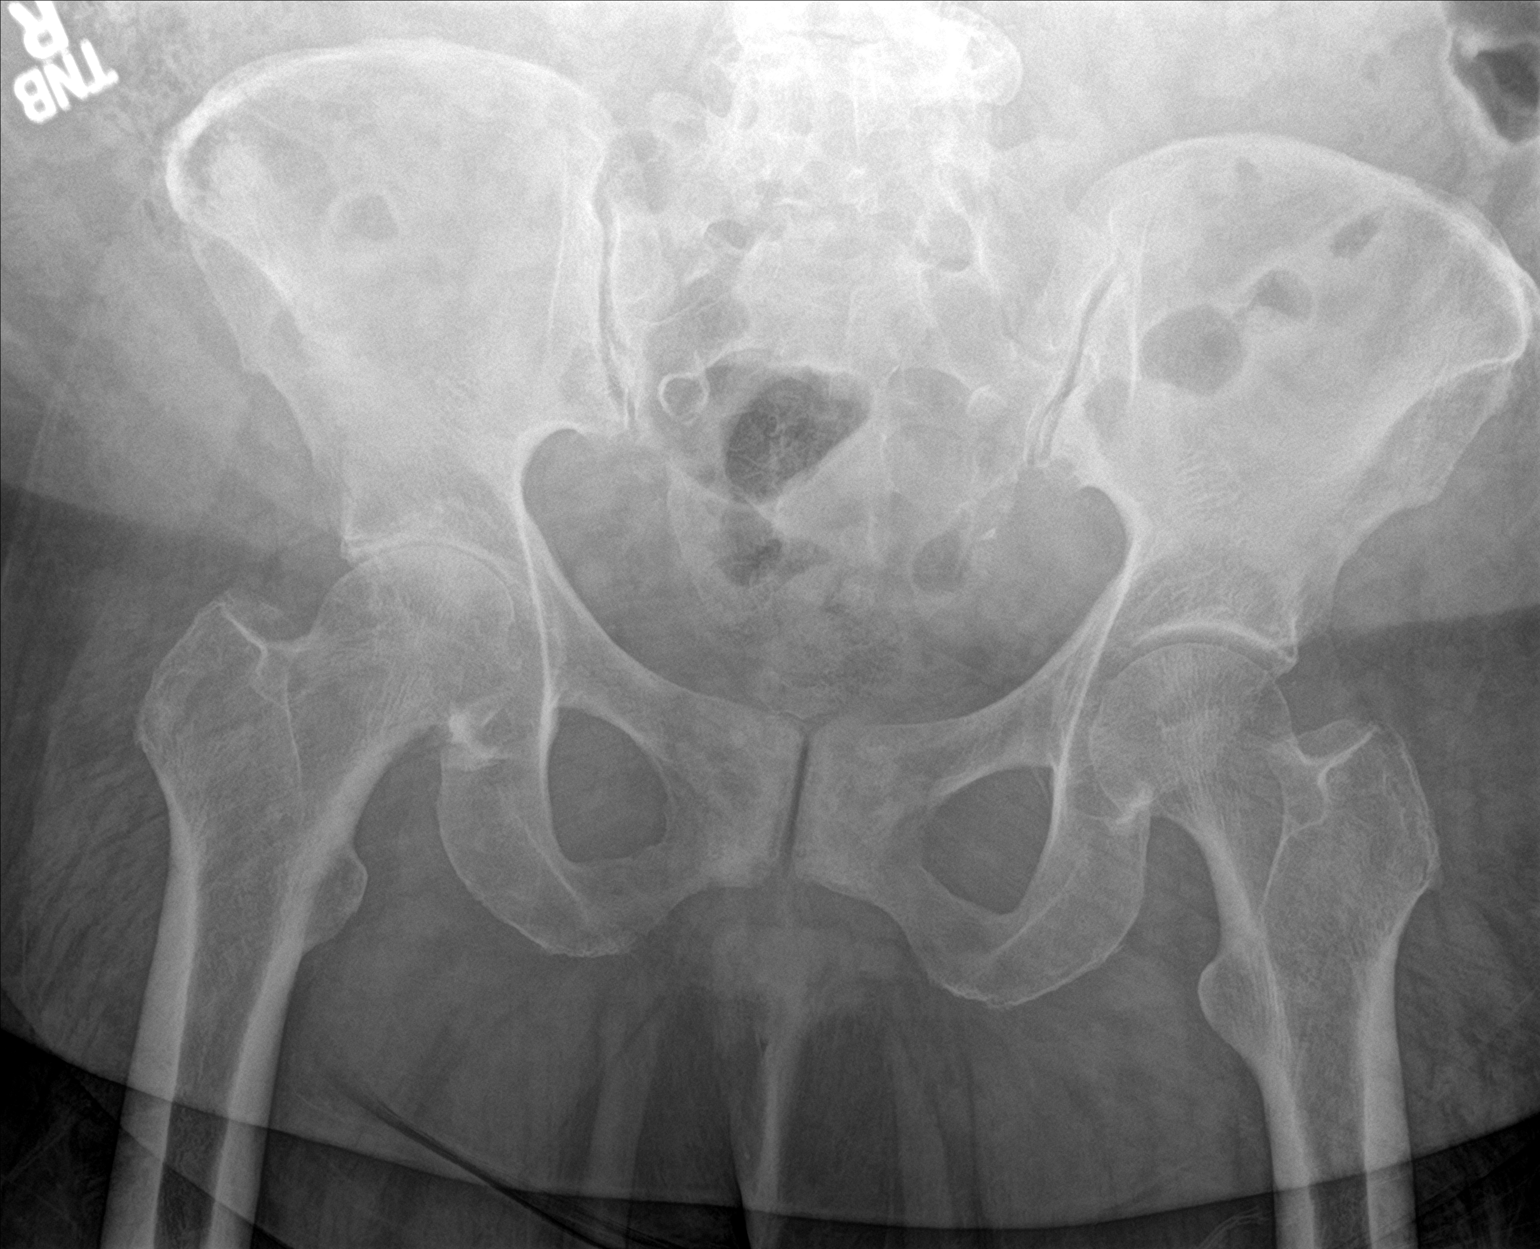

[hip ap]
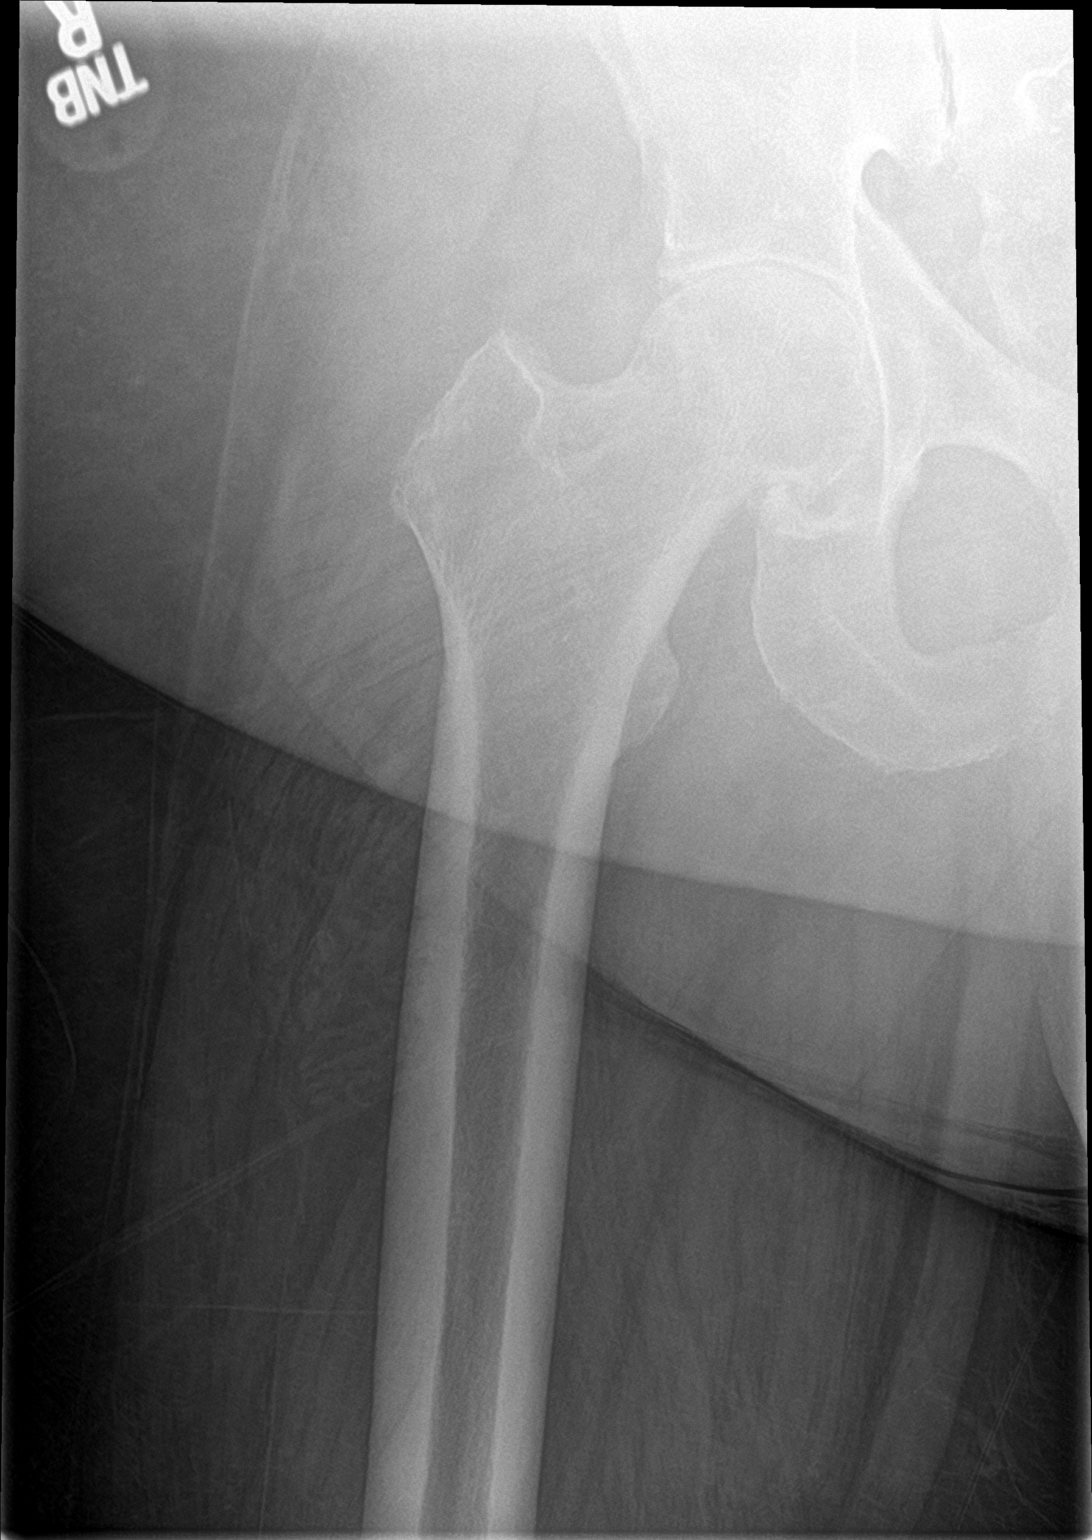

[hip lat]
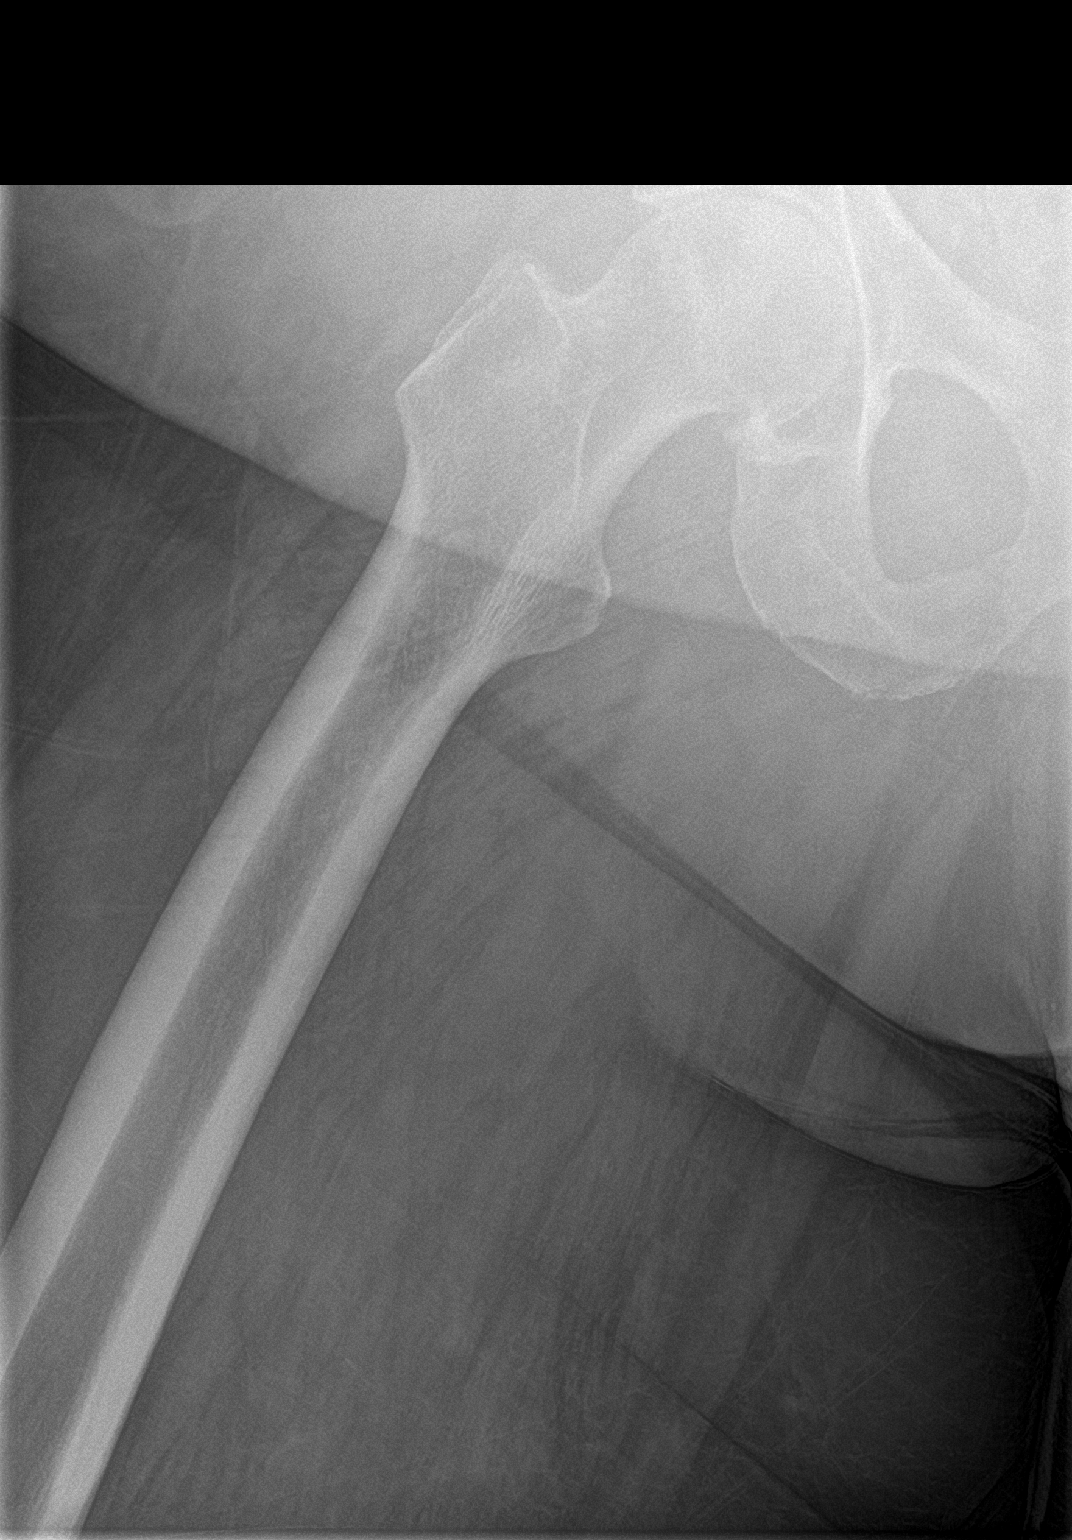

[3 of 3 positions shown; findings below may reference images not displayed]

FINDINGS: Both hips are normally located. There are moderate to advanced
degenerative changes involving the right hip with joint space
narrowing and osteophytic spurring. No acute bony findings or plain
film evidence of avascular necrosis. The pubic symphysis and SI
joints are intact. No pelvic fractures.
IMPRESSION: Moderate to advanced and asymmetric right-sided hip joint
degenerative changes but no acute bony findings.

## 2017-09-10 IMAGING — DX DG CHEST 2V
2 series · 2 of 2 positions shown · non-contrast
Comparison: 07/09/2013

CLINICAL DATA: Shortness of breath and cough

EXAM:
CHEST  2 VIEW

[chest pa]
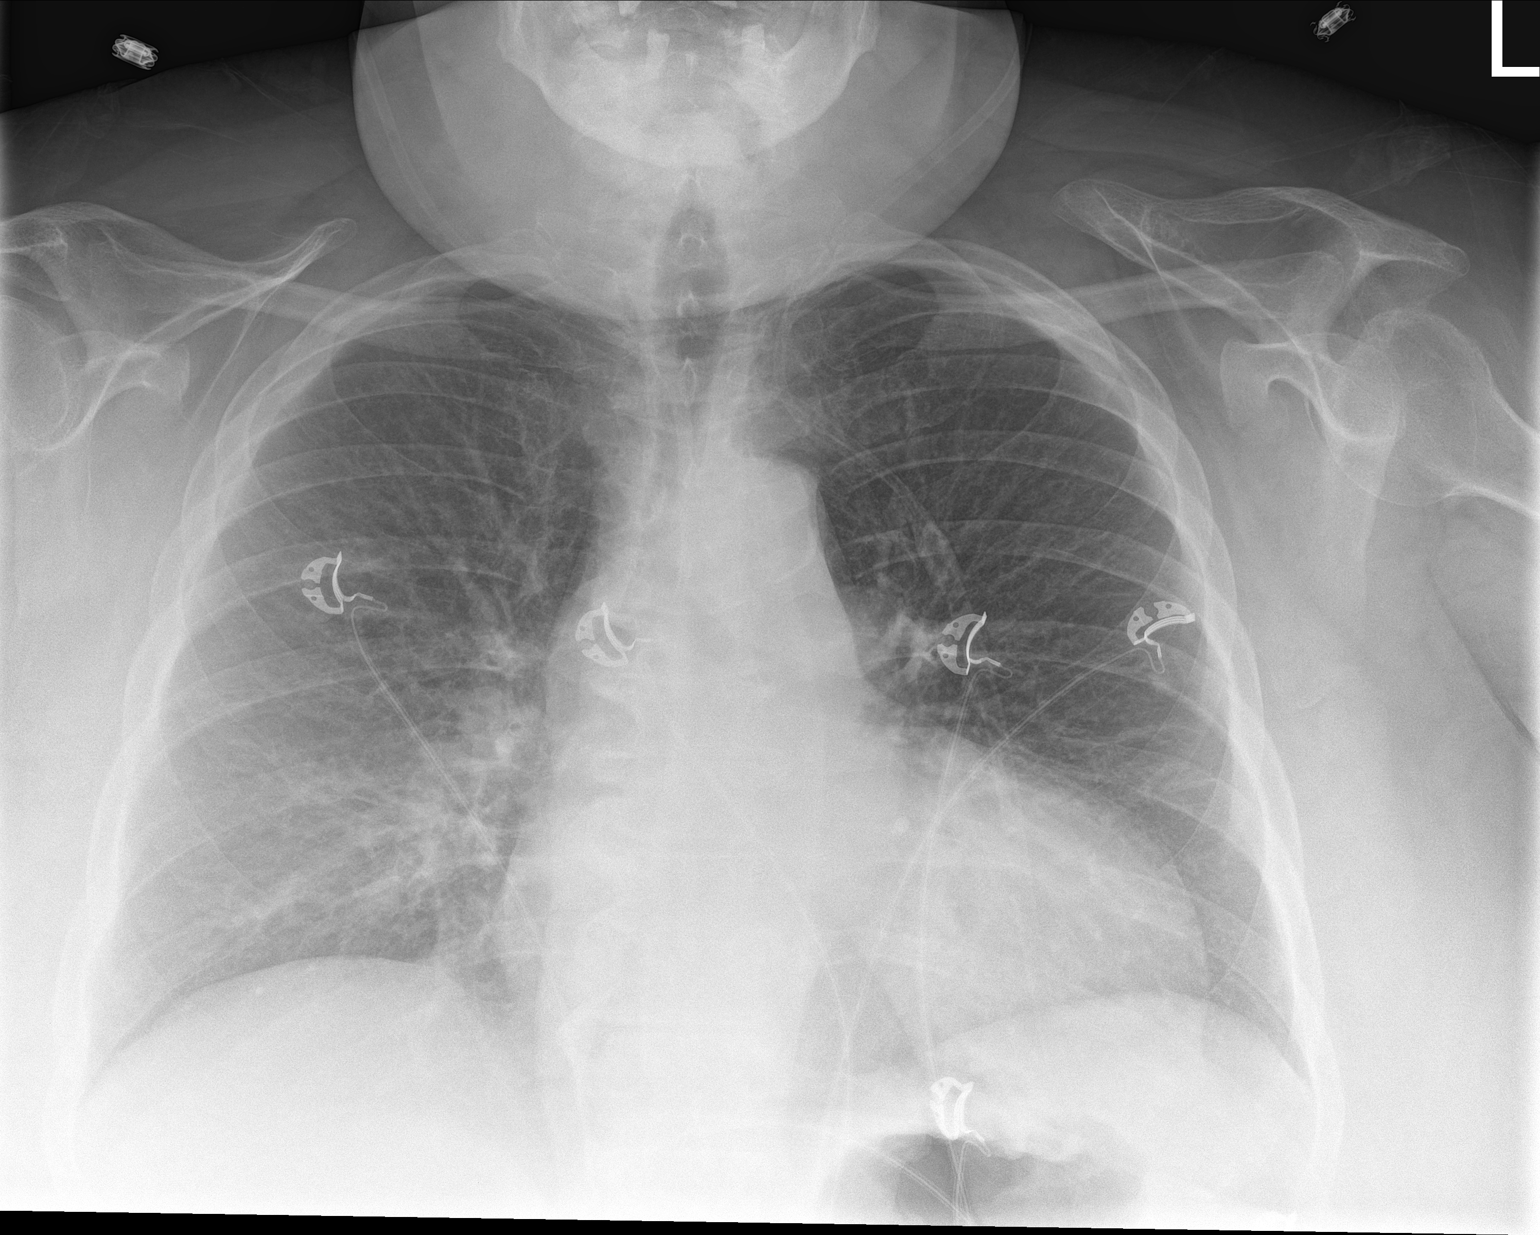

[chest lat]
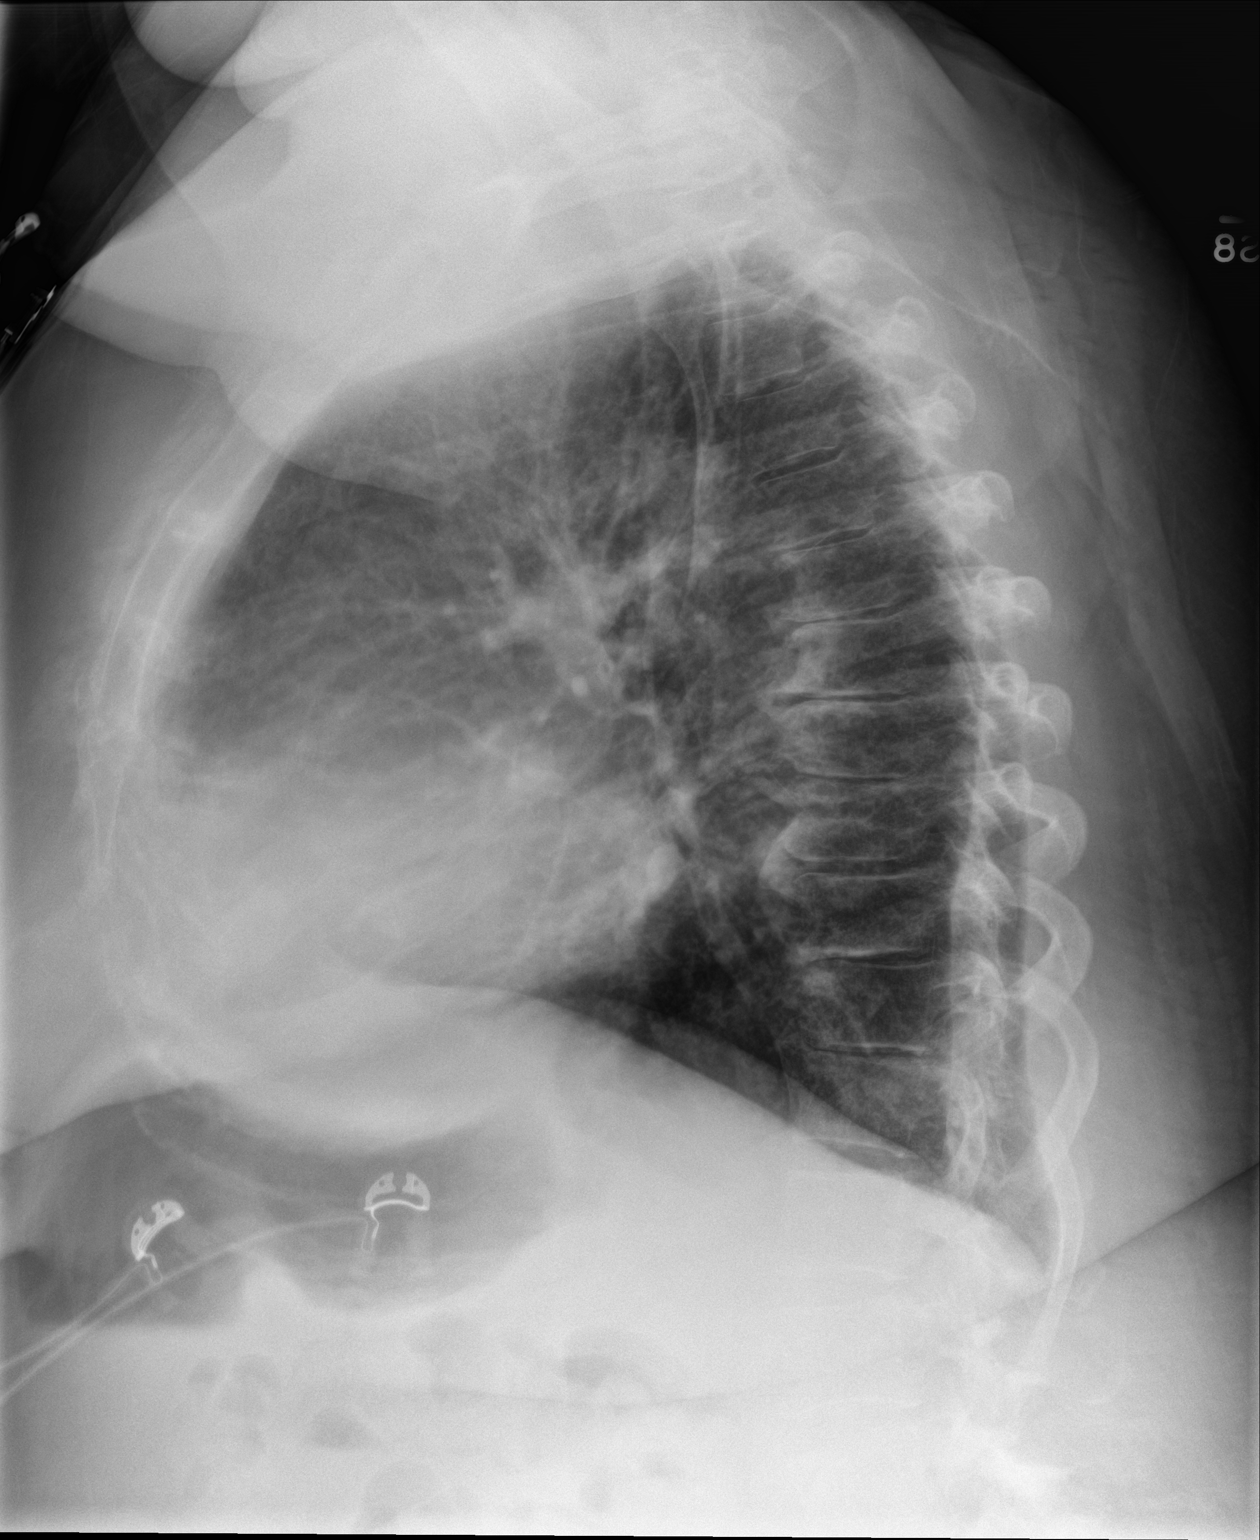

[2 of 2 positions shown; findings below may reference images not displayed]

FINDINGS: Chronic cardiopericardial enlargement. Interstitial coarsening which
appears bronchitic. Negative aortic and hilar contours. No edema,
effusion, or pneumothorax.
IMPRESSION: 1. No pneumonia or edema.
2. Cardiomegaly and bronchitic markings.

## 2018-03-14 ENCOUNTER — Ambulatory Visit: Payer: Medicaid Other | Admitting: Cardiology

## 2018-03-26 ENCOUNTER — Telehealth: Payer: Self-pay | Admitting: Cardiology

## 2018-03-26 ENCOUNTER — Encounter: Payer: Self-pay | Admitting: Cardiology

## 2018-03-26 ENCOUNTER — Ambulatory Visit: Payer: Medicaid Other | Admitting: Cardiology

## 2018-03-26 ENCOUNTER — Encounter

## 2018-03-26 VITALS — BP 104/57 | HR 74 | Ht 61.0 in | Wt 333.6 lb

## 2018-03-26 DIAGNOSIS — R0602 Shortness of breath: Secondary | ICD-10-CM

## 2018-03-26 DIAGNOSIS — Z23 Encounter for immunization: Secondary | ICD-10-CM

## 2018-03-26 DIAGNOSIS — R600 Localized edema: Secondary | ICD-10-CM

## 2018-03-26 DIAGNOSIS — I5189 Other ill-defined heart diseases: Secondary | ICD-10-CM

## 2018-03-26 MED ORDER — FUROSEMIDE 20 MG PO TABS: 20.0000 mg | ORAL_TABLET | ORAL | 2 refills | Status: DC | PRN

## 2018-03-26 NOTE — Telephone Encounter (Signed)
°  Precert needed for:  Echo w/ contrast at [facility]- sob  Location: [facility]    Date: Mar 30, 2018

## 2018-03-26 NOTE — Progress Notes (Signed)
Clinical Summary Marie West is a 62 y.o.female seen as new consult, referred by Marie West for SOb and LE edema  1. Leg edema/SOB - bilateral swelling off and on for few years. Recently has increased.  - some impriovement off lyrica.  - 10/2015 echo LVEF 60-65%, grade II diastolic dysfunction - can have some SOB at times. Occasional orthopnea.   2. OSA - 02/2017 severe OSA by sleep study - started cpap machine.   3. COPD  - followed by pcp   4. Bariatric surgery evaluation - sedentary lifestyle due to weight, chronic knee and hip pains. - DOE at 1/4 block which is chronic, though also limited by her joints pains.   Past Medical History:  Diagnosis Date  . Acute respiratory failure (HCC)   . Arthritis   . Bronchitis   . CHF (congestive heart failure) (HCC)   . COPD with exacerbation (HCC)   . Fibromyalgia   . Headache(784.0)   . Hypertension   . Hypertensive urgency   . Hyperthyroidism   . MRSA infection   . Pneumonia   . Retinal detachment   . Sciatic leg pain   . Shortness of breath      Allergies  Allergen Reactions  . Bee Venom Swelling     Current Outpatient Medications  Medication Sig Dispense Refill  . albuterol (PROVENTIL HFA;VENTOLIN HFA) 108 (90 BASE) MCG/ACT inhaler Inhale 2 puffs into the lungs every 6 (six) hours as needed for wheezing or shortness of breath. 1 Inhaler 2  . amLODipine (NORVASC) 10 MG tablet Take 10 mg by mouth daily. Reported on 11/17/2015    . aspirin EC 81 MG tablet Take 81 mg by mouth daily. Reported on 11/17/2015    . budesonide-formoterol (SYMBICORT) 160-4.5 MCG/ACT inhaler Inhale 2 puffs into the lungs 2 (two) times daily. 1 Inhaler 12  . docusate sodium (COLACE) 100 MG capsule Take 1 capsule (100 mg total) by mouth every 12 (twelve) hours. While on pain medication. 60 capsule 0  . DULoxetine (CYMBALTA) 60 MG capsule Take 60 mg by mouth daily. Reported on 11/17/2015    . ipratropium-albuterol (DUONEB) 0.5-2.5 (3) MG/3ML SOLN  Take 3 mLs by nebulization every 4 (four) hours as needed (wheezing). 360 mL 0  . levothyroxine (SYNTHROID, LEVOTHROID) 50 MCG tablet Take 50 mcg by mouth daily before breakfast. Reported on 11/17/2015    . lisinopril-hydrochlorothiazide (PRINZIDE,ZESTORETIC) 20-12.5 MG per tablet Take 1 tablet by mouth daily. Reported on 11/17/2015    . loratadine (CLARITIN) 10 MG tablet Take 10 mg by mouth daily.    . Melatonin 10 MG CAPS Take 1 capsule by mouth at bedtime.    . meloxicam (MOBIC) 7.5 MG tablet Take 1 tablet (7.5 mg total) by mouth daily. 15 tablet 0  . metFORMIN (GLUCOPHAGE) 500 MG tablet Take 500 mg by mouth at bedtime. Reported on 11/17/2015    . oxyCODONE-acetaminophen (PERCOCET/ROXICET) 5-325 MG tablet Take 1-2 tablets by mouth every 4 (four) hours as needed for severe pain. 6 tablet 0  . oxyCODONE-acetaminophen (PERCOCET/ROXICET) 5-325 MG tablet Take 1-2 tablets by mouth every 8 (eight) hours as needed for severe pain. 30 tablet 0  . ZETIA 10 MG tablet Take 1 tablet by mouth daily.  5   No current facility-administered medications for this visit.      Past Surgical History:  Procedure Laterality Date  . KNEE SURGERY    . TUBAL LIGATION       Allergies  Allergen Reactions  .  Bee Venom Swelling      Family History  Problem Relation Age of Onset  . Diabetes Mother   . Heart disease Mother        died AMI, age 50  . Heart disease Brother   . Diabetes Sister      Social History Marie West reports that she has been smoking cigarettes. She has a 18.00 pack-year smoking history. She has never used smokeless tobacco. Marie West reports that she drinks alcohol.   Review of Systems CONSTITUTIONAL: No weight loss, fever, chills, weakness or fatigue.  HEENT: Eyes: No visual loss, blurred vision, double vision or yellow sclerae.No hearing loss, sneezing, congestion, runny nose or sore throat.  SKIN: No rash or itching.  CARDIOVASCULAR: per hpi RESPIRATORY: per  hpi GASTROINTESTINAL: No anorexia, nausea, vomiting or diarrhea. No abdominal pain or blood.  GENITOURINARY: No burning on urination, no polyuria NEUROLOGICAL: No headache, dizziness, syncope, paralysis, ataxia, numbness or tingling in the extremities. No change in bowel or bladder control.  MUSCULOSKELETAL: +joint pains LYMPHATICS: No enlarged nodes. No history of splenectomy.  PSYCHIATRIC: No history of depression or anxiety.  ENDOCRINOLOGIC: No reports of sweating, cold or heat intolerance. No polyuria or polydipsia.  Marland Kitchen   Physical Examination Vitals:   03/26/18 1009 03/26/18 1017  BP: 123/73 (!) 104/57  Pulse: 72 74  SpO2: 93% 95%   Vitals:   03/26/18 1009  Weight: (!) 333 lb 9.6 oz (151.3 kg)  Height: 5\' 1"  (1.549 m)    Gen: resting comfortably, no acute distress HEENT: no scleral icterus, pupils equal round and reactive, no palptable cervical adenopathy,  CV: RRR, no /r/g no jvd Resp: Clear to auscultation bilaterally GI: abdomen is soft, non-tender, non-distended, normal bowel sounds, no hepatosplenomegaly MSK: extremities are warm, trace bilateral edema.  Skin: warm, no rash Neuro:  no focal deficits Psych: appropriate affect   Diagnostic Studies  10/2015 echo Study Conclusions  - Left ventricle: The cavity size was normal. Wall thickness was   increased in a pattern of mild LVH. Systolic function was normal.   The estimated ejection fraction was in the range of 60% to 65%.   Features are consistent with a pseudonormal left ventricular   filling pattern, with concomitant abnormal relaxation and   increased filling pressure (grade 2 diastolic dysfunction). - Aortic valve: Mildly calcified annulus. Trileaflet; mildly   thickened leaflets. Valve area (VTI): 3 cm^2. Valve area (Vmax):   2.58 cm^2. - Left atrium: The atrium was moderately dilated. - Technically difficult study. Echocontrast was used to enhance   visualization.   Assessment and Plan  1. LE  edema/SOB - possible etiologies include known diastolic dysfunction, obesity, or venous insufficiency - repeat echo to evaluate for any new cardiac dysfunction - start lasix 20mg  prn. Counseled on sodiumt limitation 2000mg  daily - EKG today shows SR, no acute ischemic changes    F/u pending echo results    Antoine Poche, M.D.

## 2018-03-26 NOTE — Patient Instructions (Addendum)
Medication Instructions:   Begin Lasix 20mg  as needed for swelling.   Continue all other medications.    Labwork: none  Testing/Procedures:  Your physician has requested that you have an echocardiogram with contrast.  Echocardiography is a painless test that uses sound waves to create images of your heart. It provides your doctor with information about the size and shape of your heart and how well your heart's chambers and valves are working. This procedure takes approximately one hour. There are no restrictions for this procedure.  Office will contact with results via phone or letter.    Follow-Up: Pending   Any Other Special Instructions Will Be Listed Below (If Applicable).  If you need a refill on your cardiac medications before your next appointment, please call your pharmacy.

## 2018-04-02 ENCOUNTER — Ambulatory Visit (HOSPITAL_COMMUNITY): Admission: RE | Admit: 2018-04-02 | Payer: Medicaid Other | Source: Ambulatory Visit

## 2018-04-13 ENCOUNTER — Encounter: Payer: Self-pay | Admitting: *Deleted

## 2018-05-08 ENCOUNTER — Telehealth: Payer: Self-pay | Admitting: Cardiology

## 2018-05-08 DIAGNOSIS — R600 Localized edema: Secondary | ICD-10-CM

## 2018-05-08 DIAGNOSIS — R0602 Shortness of breath: Secondary | ICD-10-CM

## 2018-05-08 NOTE — Telephone Encounter (Signed)
Needing order placed for this pt's Echo

## 2018-05-14 ENCOUNTER — Ambulatory Visit (HOSPITAL_COMMUNITY)
Admission: RE | Admit: 2018-05-14 | Discharge: 2018-05-14 | Disposition: A | Discharge status: Home / Self Care | Payer: Medicaid Other | Source: Ambulatory Visit | Attending: Cardiology | Admitting: Cardiology

## 2018-05-14 ENCOUNTER — Other Ambulatory Visit: Payer: Self-pay

## 2018-05-14 DIAGNOSIS — R0602 Shortness of breath: Secondary | ICD-10-CM

## 2018-05-14 MED ORDER — PERFLUTREN LIPID MICROSPHERE
1.0000 mL | INTRAVENOUS | Status: AC | PRN
  Administered 2018-05-14: 1 mL via INTRAVENOUS
  Administered 2018-05-14: 2 mL via INTRAVENOUS
  Administered 2018-05-14: 1 mL via INTRAVENOUS
  Filled 2018-05-14: qty 10

## 2018-05-14 NOTE — Progress Notes (Signed)
*  PRELIMINARY RESULTS* Echocardiogram 2D Echocardiogram has been performed with Definity.  Stacey DrainWhite, Quayshawn Nin J 05/14/2018, 12:08 PM

## 2018-07-11 ENCOUNTER — Telehealth: Payer: Self-pay | Admitting: Cardiology

## 2018-07-11 NOTE — Telephone Encounter (Signed)
Noted. Awaiting request from surgeon office.

## 2018-07-11 NOTE — Telephone Encounter (Signed)
Patient called stating that needs clearance for her upcoming surgery. States that they were going to fax a form to our office.   [facility])  [address]  [address]  [place], [place] [address]  [phone]

## 2018-09-20 NOTE — Telephone Encounter (Signed)
Patient called asking if we had received request from surgeon for clearance and if clearance had been sent. I told her it looked like we have not received a request from surgeon

## 2018-09-21 NOTE — Telephone Encounter (Signed)
Spoke with [facility] and they had to send out clearance notes to [employer] company who would be faxing the request to [place] once they complete it and should be coming soon, they have been behind with records with COVID 19 pandemic. Pt made aware of this

## 2019-05-20 ENCOUNTER — Other Ambulatory Visit: Payer: Medicaid Other | Admitting: Adult Health

## 2019-07-23 ENCOUNTER — Other Ambulatory Visit: Payer: Medicaid Other | Admitting: Adult Health

## 2019-11-04 NOTE — Progress Notes (Deleted)
Cardiology Office Note  Date: 11/04/2019   ID: Marie West, DOB January 25, 1956, MRN [id number]  PCP:  Elmer Picker, [facility]  Cardiologist:  Dina Rich, MD Electrophysiologist:  None   Chief Complaint: F/U SOB.  History of Present Illness: Marie West is a 64 y.o. female with a history of shortness of breath and lower extremity edema.  History of OSA severe on CPAP.  History of COPD followed by PCP.  Initially seen as a new consult on 03/26/2018 after being referred by NP following for shortness of breath.  She was for bariatric surgery evaluation at that visit, She had complained of bilateral leg swelling on and off for few years.  It had recently increased prior to that visit.  She also admitted to some DOE after walking one quarter of a block which was chronic.  Her ability to walk/exercise was limited due to joint pain. A repeat echocardiogram was ordered and Lasix  20 mg was started.  Past Medical History:  Diagnosis Date  . Acute respiratory failure (HCC)   . Arthritis   . Bronchitis   . CHF (congestive heart failure) (HCC)   . COPD with exacerbation (HCC)   . Fibromyalgia   . Headache(784.0)   . Hypertension   . Hypertensive urgency   . Hyperthyroidism   . MRSA infection   . Pneumonia   . Retinal detachment   . Sciatic leg pain   . Shortness of breath     Past Surgical History:  Procedure Laterality Date  . KNEE SURGERY    . TUBAL LIGATION      Current Outpatient Medications  Medication Sig Dispense Refill  . albuterol (PROVENTIL HFA;VENTOLIN HFA) 108 (90 BASE) MCG/ACT inhaler Inhale 2 puffs into the lungs every 6 (six) hours as needed for wheezing or shortness of breath. 1 Inhaler 2  . amLODipine (NORVASC) 10 MG tablet Take 10 mg by mouth daily. Reported on 11/17/2015    . aspirin EC 81 MG tablet Take 81 mg by mouth daily. Reported on 11/17/2015    . budesonide-formoterol (SYMBICORT) 160-4.5 MCG/ACT inhaler Inhale 2 puffs into the  lungs 2 (two) times daily. 1 Inhaler 12  . docusate sodium (COLACE) 100 MG capsule Take 1 capsule (100 mg total) by mouth every 12 (twelve) hours. While on pain medication. 60 capsule 0  . DULoxetine (CYMBALTA) 60 MG capsule Take 60 mg by mouth daily. Reported on 11/17/2015    . furosemide (LASIX) 20 MG tablet Take 1 tablet (20 mg total) by mouth as needed for edema (swelling). 30 tablet 2  . ipratropium-albuterol (DUONEB) 0.5-2.5 (3) MG/3ML SOLN Take 3 mLs by nebulization every 4 (four) hours as needed (wheezing). 360 mL 0  . levothyroxine (SYNTHROID, LEVOTHROID) 50 MCG tablet Take 50 mcg by mouth daily before breakfast. Reported on 11/17/2015    . lisinopril-hydrochlorothiazide (PRINZIDE,ZESTORETIC) 20-12.5 MG per tablet Take 1 tablet by mouth daily. Reported on 11/17/2015    . loratadine (CLARITIN) 10 MG tablet Take 10 mg by mouth daily.    . Melatonin 10 MG CAPS Take 1 capsule by mouth at bedtime.    . meloxicam (MOBIC) 7.5 MG tablet Take 1 tablet (7.5 mg total) by mouth daily. 15 tablet 0  . metFORMIN (GLUCOPHAGE) 500 MG tablet Take 500 mg by mouth at bedtime. Reported on 11/17/2015    . oxyCODONE-acetaminophen (PERCOCET/ROXICET) 5-325 MG tablet Take 1-2 tablets by mouth every 4 (four) hours as needed for severe pain. 6 tablet 0  .  oxyCODONE-acetaminophen (PERCOCET/ROXICET) 5-325 MG tablet Take 1-2 tablets by mouth every 8 (eight) hours as needed for severe pain. 30 tablet 0  . ZETIA 10 MG tablet Take 1 tablet by mouth daily.  5   No current facility-administered medications for this visit.   Allergies:  Bee venom   Social History: The patient  reports that she has been smoking cigarettes. She started smoking about 48 years ago. She has a 18.00 pack-year smoking history. She has never used smokeless tobacco. She reports current alcohol use. She reports that she does not use drugs.   Family History: The patient's family history includes Diabetes in her mother and sister; Heart disease in her  brother and mother.   ROS:  Please see the history of present illness. Otherwise, complete review of systems is positive for none.  All other systems are reviewed and negative.   Physical Exam: VS:  There were no vitals taken for this visit., BMI There is no height or weight on file to calculate BMI.  Wt Readings from Last 3 Encounters:  03/26/18 (!) 333 lb 9.6 oz (151.3 kg)  02/01/17 300 lb (136.1 kg)  09/16/16 (!) 320 lb (145.2 kg)    General: Patient appears comfortable at rest. HEENT: Conjunctiva and lids normal, oropharynx clear with moist mucosa. Neck: Supple, no elevated JVP or carotid bruits, no thyromegaly. Lungs: Clear to auscultation, nonlabored breathing at rest. Cardiac: Regular rate and rhythm, no S3 or significant systolic murmur, no pericardial rub. Abdomen: Soft, nontender, no hepatomegaly, bowel sounds present, no guarding or rebound. Extremities: No pitting edema, distal pulses 2+. Skin: Warm and dry. Musculoskeletal: No kyphosis. Neuropsychiatric: Alert and oriented x3, affect grossly appropriate.  ECG:  {EKG/Telemetry Strips Reviewed:[phone]}   Recent Labwork: No results found for requested labs within last 8760 hours.     Component Value Date/Time   CHOL 193 06/02/2012 0621   TRIG 112 06/02/2012 0621   HDL 66 06/02/2012 0621   CHOLHDL 2.9 06/02/2012 0621   VLDL 22 06/02/2012 0621   LDLCALC 105 (H) 06/02/2012 0621    Other Studies Reviewed Today:  Echocardiogram 05/14/2018 Study Conclusions   - Left ventricle: The cavity size was normal. There was mild  concentric hypertrophy. Systolic function was normal. The  estimated ejection fraction was in the range of 55% to 60%. Wall  motion was normal; there were no regional wall motion  abnormalities. Left ventricular diastolic function parameters  were normal.  - Aortic valve: Sclerosis without stenosis.  - Mitral valve: Calcified annulus. Mildly thickened leaflets .  - Left atrium: The  atrium was moderately dilated.  - Atrial septum: There was increased thickness of the septum,  consistent with lipomatous hypertrophy. No defect or patent  foramen ovale was identified.   10/2015 echo Study Conclusions  - Left ventricle: The cavity size was normal. Wall thickness was increased in a pattern of mild LVH. Systolic function was normal. The estimated ejection fraction was in the range of 60% to 65%. Features are consistent with a pseudonormal left ventricular filling pattern, with concomitant abnormal relaxation and increased filling pressure (grade 2 diastolic dysfunction). - Aortic valve: Mildly calcified annulus. Trileaflet; mildly thickened leaflets. Valve area (VTI): 3 cm^2. Valve area (Vmax): 2.58 cm^2. - Left atrium: The atrium was moderately dilated. - Technically difficult study. Echocontrast was used to enhance visualization.   Assessment and Plan:  1. SOB (shortness of breath)   2. Leg edema   3. OSA on CPAP    1. SOB (shortness  of breath) ***  2. Leg edema ***  3. OSA on CPAP ***  Medication Adjustments/Labs and Tests Ordered: Current medicines are reviewed at length with the patient today.  Concerns regarding medicines are outlined above.   Disposition: Follow-up with ***  Signed, Rennis Harding, NP 11/04/2019 10:06 PM    [facility] [address] [place], [place], [place] [address] Phone: [phone]; Fax: [phone]

## 2019-11-05 ENCOUNTER — Ambulatory Visit: Payer: Medicaid Other | Admitting: Family Medicine

## 2020-02-21 ENCOUNTER — Emergency Department (HOSPITAL_COMMUNITY)
Admission: EM | Admit: 2020-02-21 | Discharge: 2020-02-21 | Disposition: A | Discharge status: Home / Self Care | Payer: Medicaid Other | Attending: Emergency Medicine | Admitting: Emergency Medicine

## 2020-02-21 ENCOUNTER — Other Ambulatory Visit: Payer: Self-pay

## 2020-02-21 ENCOUNTER — Encounter (HOSPITAL_COMMUNITY): Payer: Self-pay

## 2020-02-21 DIAGNOSIS — M25519 Pain in unspecified shoulder: Secondary | ICD-10-CM | POA: Diagnosis not present

## 2020-02-21 DIAGNOSIS — Y9389 Activity, other specified: Secondary | ICD-10-CM | POA: Insufficient documentation

## 2020-02-21 DIAGNOSIS — Y998 Other external cause status: Secondary | ICD-10-CM | POA: Insufficient documentation

## 2020-02-21 DIAGNOSIS — Z5321 Procedure and treatment not carried out due to patient leaving prior to being seen by health care provider: Secondary | ICD-10-CM | POA: Diagnosis not present

## 2020-02-21 DIAGNOSIS — M542 Cervicalgia: Secondary | ICD-10-CM | POA: Diagnosis present

## 2020-02-21 DIAGNOSIS — Y929 Unspecified place or not applicable: Secondary | ICD-10-CM | POA: Diagnosis not present
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
## 2020-04-20 ENCOUNTER — Encounter (HOSPITAL_COMMUNITY): Payer: Self-pay

## 2020-04-20 ENCOUNTER — Ambulatory Visit (HOSPITAL_COMMUNITY): Payer: Medicaid Other | Admitting: Physical Therapy

## 2020-04-21 ENCOUNTER — Other Ambulatory Visit: Payer: Self-pay

## 2020-04-21 ENCOUNTER — Ambulatory Visit (HOSPITAL_COMMUNITY): Payer: Medicaid Other | Attending: Sports Medicine | Admitting: Physical Therapy

## 2020-04-21 ENCOUNTER — Encounter (HOSPITAL_COMMUNITY): Payer: Self-pay | Admitting: Physical Therapy

## 2020-04-21 DIAGNOSIS — M542 Cervicalgia: Secondary | ICD-10-CM | POA: Diagnosis not present

## 2020-04-21 DIAGNOSIS — R29898 Other symptoms and signs involving the musculoskeletal system: Secondary | ICD-10-CM | POA: Diagnosis present
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
## 2020-04-28 ENCOUNTER — Other Ambulatory Visit: Payer: Self-pay

## 2020-04-28 ENCOUNTER — Encounter (HOSPITAL_COMMUNITY): Payer: Self-pay | Admitting: Physical Therapy

## 2020-04-28 ENCOUNTER — Ambulatory Visit (HOSPITAL_COMMUNITY): Payer: Medicaid Other | Admitting: Physical Therapy

## 2020-04-28 DIAGNOSIS — M542 Cervicalgia: Secondary | ICD-10-CM | POA: Diagnosis not present

## 2020-04-28 DIAGNOSIS — R29898 Other symptoms and signs involving the musculoskeletal system: Secondary | ICD-10-CM

## 2020-04-28 NOTE — Patient Instructions (Signed)
Access Code: [facility] URL: [email or web address] Date: 04/28/2020 Prepared by: Georges Lynch  Exercises Seated Cervical Retraction - 2 x daily - 7 x weekly - 2 sets - 10 reps - 5 second hold Seated Shoulder Rolls - 2 x daily - 7 x weekly - 2 sets - 10 reps

## 2020-04-28 NOTE — Therapy (Signed)
[facility] [address] [place], [place], [address] Phone: [phone]   Fax:  [phone]  Physical Therapy Treatment  Patient Details  Name: Marie West MRN: [id number] Date of Birth: February 02, 1956 Referring Provider (PT): Waldon Reining   Encounter Date: 04/28/2020   PT End of Session - 04/28/20 1314    Visit Number 2    Number of Visits 8    Date for PT Re-Evaluation 06/02/20    Authorization Type medicaid - UHC community - [place] required, 27 combined VL in calendar year, check auth (still pending)    Progress Note Due on Visit 10    PT Start Time 1308    PT Stop Time 1346    PT Time Calculation (min) 38 min    Activity Tolerance Patient tolerated treatment well    Behavior During Therapy [facility] for tasks assessed/performed           Past Medical History:  Diagnosis Date  . Acute respiratory failure (HCC)   . Arthritis   . Bronchitis   . CHF (congestive heart failure) (HCC)   . COPD with exacerbation (HCC)   . Fibromyalgia   . Headache(784.0)   . Hypertension   . Hypertensive urgency   . Hyperthyroidism   . MRSA infection   . Pneumonia   . Retinal detachment   . Sciatic leg pain   . Shortness of breath     Past Surgical History:  Procedure Laterality Date  . KNEE SURGERY    . TUBAL LIGATION      There were no vitals filed for this visit.   Subjective Assessment - 04/28/20 1314    Subjective Patient reports compliance with HEP. Says its ok. Says her neck seems a little better, but still has most trouble at night when trying to sleep. Says it runs down into her LT shoulder and middle of back.    Pertinent History COPD, fibromyalgia, pre diabetes    How long can you stand comfortably? 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 To reach up overhead.    Currently in Pain? Yes    Pain Score 7     Pain Location Neck    Pain Orientation Left    Pain Descriptors / Indicators Sharp    Pain Type Acute pain    Pain Onset More than a month ago    Pain Frequency Constant                             [facility] Adult PT Treatment/Exercise - 04/28/20 0001      Exercises   Exercises Neck      Neck Exercises: Seated   Neck Retraction 10 reps;5 secs    Shoulder Rolls Backwards;15 reps    Other Seated Exercise 3D seated cervical excursion x10 each       Shoulder Exercises: Supine   Other Supine Exercises scapular retraction 10x 5" holds B       Manual Therapy   Manual Therapy Soft tissue mobilization    Manual therapy comments manual treatment performed separate form all other activity    Soft tissue mobilization IASTM to Lt upper trap, levator, and periscapular muscles with  pateitn seated (lv 10)                   PT Education - 04/28/20 1343    Education Details on therapy goals, HEP, exercise technique, purpose of added manual, and updated HEP    Person(s) Educated Patient    Methods Explanation;Handout    Comprehension Verbalized understanding            PT Short Term Goals - 04/21/20 1528      PT SHORT TERM GOAL #1   Title Patient will be independent in self management strategies to improve quality of life and functional outcomes.    Time 3    Period Weeks    Status New    Target Date 05/12/20      PT SHORT TERM GOAL #2   Title Patient will report at least 25% improvement in overall symptoms and/or function to demonstrate improved functional mobility    Time 3    Period Weeks    Status New    Target Date 05/12/20             PT Long Term Goals - 04/21/20 1529      PT LONG TERM GOAL #1   Title Patient will report at least 50% improvement in overall symptoms and/or function to demonstrate improved functional mobility    Time 6    Period Weeks     Status New    Target Date 06/02/20      PT LONG TERM GOAL #2   Title Patient will improve on Neck disability index by at least 4 points to demonstrate improved quality of life.    Time 6    Period Weeks    Status New    Target Date 06/02/20      PT LONG TERM GOAL #3   Title Patient will report being able to bring arms up overhead to grab something in overhead cabinet to improve functional independence.    Time 6    Period Weeks    Status New    Target Date 06/02/20                 Plan - 04/28/20 1344    Clinical Impression Statement Patient tolerated session well overall today. Reviewed therapy goals and HEP. Initiated ther ex with focus on cervical and thoracic mobilization as well as postural strengthening. Patient educated on purpose and function of all added exercise. Add manual IASTM to address pain and restriction. Patient educated on and issued updated HEP handout.    Personal Factors and Comorbidities Comorbidity 1;Comorbidity 2    Comorbidities hx of lumbar pain, hx of shoulder pain, COPD, Fibromyalgia    Stability/Clinical Decision Making Stable/Uncomplicated    Rehab Potential Fair    PT Frequency --   8 visits over 6 week certification period at 1-2x/week   PT Duration 6 weeks    PT Treatment/Interventions ADLs/Self Care Home Management;Electrical Stimulation;Iontophoresis 4mg /ml Dexamethasone;Aquatic Therapy;Moist Heat;Traction;Therapeutic exercise;Therapeutic activities;Neuromuscular re-education;DME Instruction;Patient/family education;Manual techniques;Dry needling;Joint Manipulations    PT Next Visit Plan Continue to progress cervical mobility and postural strengthening as tolerated. does not tolerate supine; heat/ manual/percussion gun, seated traction, neck and shoulder ROM    PT Home Exercise Plan scapular retraction 04/28/20: chin tuck, shoulder rolls    Consulted and Agree with Plan of Care Patient           Patient will benefit from skilled  therapeutic intervention in order to improve the following deficits and impairments:  Decreased range of motion, Decreased activity tolerance, Decreased strength, Pain, Hypomobility, Decreased mobility  Visit Diagnosis: Cervicalgia  Decreased range of motion of neck     Problem List Patient Active Problem List   Diagnosis Date Noted  . Acute dyspnea 11/17/2015  . Depression 11/17/2015  . Hypertension 11/17/2015  . Non-insulin dependent type 2 diabetes mellitus (HCC) 11/17/2015  . Hypothyroidism 11/17/2015  . Acute exacerbation of chronic obstructive pulmonary disease (COPD) (HCC)   . Hypoxia   . CAP (community acquired pneumonia) 07/09/2013  . Acute respiratory failure (HCC) 07/09/2013  . Pleural effusion 07/09/2013  . Hypertensive urgency 07/09/2013  . Acute respiratory failure with hypoxia (HCC) 07/09/2013  . COPD with exacerbation (HCC) 06/01/2012  . Tobacco abuse 06/01/2012  . Morbid obesity (HCC) 06/01/2012  . Arthritis 06/01/2012  . possible CHF (congestive heart failure) 06/01/2012   1:48 PM, 04/28/20 Georges Lynch PT DPT  Physical Therapist with [place]  [facility]  [phone]   [facility] [address] [place], [place], [address] Phone: [phone]   Fax:  [phone]  Name: Marie West MRN: [id number] Date of Birth: 08-Oct-1955

## 2020-05-05 ENCOUNTER — Ambulatory Visit (HOSPITAL_COMMUNITY): Payer: Medicaid Other

## 2020-05-05 ENCOUNTER — Telehealth (HOSPITAL_COMMUNITY): Payer: Self-pay

## 2020-05-05 NOTE — Telephone Encounter (Signed)
pt cancelled appt for today because she has a fever 

## 2020-05-12 ENCOUNTER — Telehealth (HOSPITAL_COMMUNITY): Payer: Self-pay | Admitting: Physical Therapy

## 2020-05-12 ENCOUNTER — Ambulatory Visit (HOSPITAL_COMMUNITY): Payer: Medicaid Other | Admitting: Physical Therapy

## 2020-05-12 NOTE — Telephone Encounter (Signed)
pt cancelled appt because she says she is still sick

## 2020-05-19 ENCOUNTER — Telehealth (HOSPITAL_COMMUNITY): Payer: Self-pay

## 2020-05-19 ENCOUNTER — Ambulatory Visit (HOSPITAL_COMMUNITY): Payer: Medicaid Other

## 2020-05-19 NOTE — Telephone Encounter (Signed)
No show, attempted to call with no answer or answering machine to leave message regarding missed apt.    Becky Sax, LPTA/CLT; [place] [phone]

## 2020-05-26 ENCOUNTER — Ambulatory Visit (HOSPITAL_COMMUNITY): Payer: Medicare Other | Attending: Sports Medicine | Admitting: Physical Therapy

## 2020-05-26 ENCOUNTER — Telehealth (HOSPITAL_COMMUNITY): Payer: Self-pay | Admitting: Physical Therapy

## 2020-05-26 NOTE — Telephone Encounter (Signed)
No Show #2. Called and spoke with patient who is in a lot of pain in her legs. Would like to put PT for her neck on hold at this time. Discussed formal discharge with patient if patient does not return by the end of the year.  Cancelled all follow up visits at this time.   1:39 PM, 05/26/20 Tereasa Coop, DPT Physical Therapy with [facility]  [phone] office

## 2020-06-02 ENCOUNTER — Ambulatory Visit (HOSPITAL_COMMUNITY): Payer: Medicare Other

## 2020-06-09 ENCOUNTER — Encounter (HOSPITAL_COMMUNITY): Payer: Medicaid Other

## 2020-06-16 ENCOUNTER — Encounter (HOSPITAL_COMMUNITY): Payer: Medicaid Other

## 2020-06-23 ENCOUNTER — Encounter (HOSPITAL_COMMUNITY): Payer: Medicaid Other | Admitting: Physical Therapy

## 2020-07-28 ENCOUNTER — Other Ambulatory Visit: Payer: Self-pay

## 2020-07-28 ENCOUNTER — Encounter (HOSPITAL_COMMUNITY): Payer: Self-pay | Admitting: *Deleted

## 2020-07-28 ENCOUNTER — Emergency Department (HOSPITAL_COMMUNITY): Payer: Medicare HMO

## 2020-07-28 ENCOUNTER — Inpatient Hospital Stay (HOSPITAL_COMMUNITY)
Admission: EM | Admit: 2020-07-28 | Discharge: 2020-08-01 | DRG: 177 | Disposition: A | Discharge status: Home / Self Care | Payer: Medicare HMO | Attending: Family Medicine | Admitting: Family Medicine

## 2020-07-28 DIAGNOSIS — J9601 Acute respiratory failure with hypoxia: Secondary | ICD-10-CM | POA: Diagnosis present

## 2020-07-28 DIAGNOSIS — Z7982 Long term (current) use of aspirin: Secondary | ICD-10-CM

## 2020-07-28 DIAGNOSIS — J441 Chronic obstructive pulmonary disease with (acute) exacerbation: Secondary | ICD-10-CM | POA: Diagnosis present

## 2020-07-28 DIAGNOSIS — Z79890 Hormone replacement therapy (postmenopausal): Secondary | ICD-10-CM

## 2020-07-28 DIAGNOSIS — E785 Hyperlipidemia, unspecified: Secondary | ICD-10-CM | POA: Diagnosis present

## 2020-07-28 DIAGNOSIS — J449 Chronic obstructive pulmonary disease, unspecified: Secondary | ICD-10-CM

## 2020-07-28 DIAGNOSIS — R0902 Hypoxemia: Secondary | ICD-10-CM | POA: Diagnosis not present

## 2020-07-28 DIAGNOSIS — R0600 Dyspnea, unspecified: Secondary | ICD-10-CM

## 2020-07-28 DIAGNOSIS — Z6841 Body Mass Index (BMI) 40.0 and over, adult: Secondary | ICD-10-CM

## 2020-07-28 DIAGNOSIS — M797 Fibromyalgia: Secondary | ICD-10-CM | POA: Diagnosis present

## 2020-07-28 DIAGNOSIS — Z79899 Other long term (current) drug therapy: Secondary | ICD-10-CM

## 2020-07-28 DIAGNOSIS — Z720 Tobacco use: Secondary | ICD-10-CM | POA: Diagnosis present

## 2020-07-28 DIAGNOSIS — Z791 Long term (current) use of non-steroidal anti-inflammatories (NSAID): Secondary | ICD-10-CM

## 2020-07-28 DIAGNOSIS — E119 Type 2 diabetes mellitus without complications: Secondary | ICD-10-CM

## 2020-07-28 DIAGNOSIS — J440 Chronic obstructive pulmonary disease with acute lower respiratory infection: Secondary | ICD-10-CM | POA: Diagnosis present

## 2020-07-28 DIAGNOSIS — E1165 Type 2 diabetes mellitus with hyperglycemia: Secondary | ICD-10-CM | POA: Diagnosis not present

## 2020-07-28 DIAGNOSIS — J1282 Pneumonia due to coronavirus disease 2019: Secondary | ICD-10-CM | POA: Diagnosis present

## 2020-07-28 DIAGNOSIS — U071 COVID-19: Principal | ICD-10-CM | POA: Diagnosis present

## 2020-07-28 DIAGNOSIS — Z91030 Bee allergy status: Secondary | ICD-10-CM

## 2020-07-28 DIAGNOSIS — I110 Hypertensive heart disease with heart failure: Secondary | ICD-10-CM | POA: Diagnosis present

## 2020-07-28 DIAGNOSIS — Z7984 Long term (current) use of oral hypoglycemic drugs: Secondary | ICD-10-CM

## 2020-07-28 DIAGNOSIS — I5031 Acute diastolic (congestive) heart failure: Secondary | ICD-10-CM

## 2020-07-28 DIAGNOSIS — F17210 Nicotine dependence, cigarettes, uncomplicated: Secondary | ICD-10-CM | POA: Diagnosis present

## 2020-07-28 DIAGNOSIS — Z8614 Personal history of Methicillin resistant Staphylococcus aureus infection: Secondary | ICD-10-CM

## 2020-07-28 DIAGNOSIS — T380X5A Adverse effect of glucocorticoids and synthetic analogues, initial encounter: Secondary | ICD-10-CM | POA: Diagnosis not present

## 2020-07-28 DIAGNOSIS — Z7951 Long term (current) use of inhaled steroids: Secondary | ICD-10-CM

## 2020-07-28 DIAGNOSIS — E039 Hypothyroidism, unspecified: Secondary | ICD-10-CM | POA: Diagnosis present

## 2020-07-28 DIAGNOSIS — Z8249 Family history of ischemic heart disease and other diseases of the circulatory system: Secondary | ICD-10-CM

## 2020-07-28 DIAGNOSIS — I10 Essential (primary) hypertension: Secondary | ICD-10-CM | POA: Diagnosis present

## 2020-07-28 LAB — CBC WITH DIFFERENTIAL/PLATELET
Abs Immature Granulocytes: 0.02 10*3/uL (ref 0.00–0.07)
Basophils Absolute: 0 10*3/uL (ref 0.0–0.1)
Basophils Relative: 0 %
Eosinophils Absolute: 0.2 10*3/uL (ref 0.0–0.5)
Eosinophils Relative: 4 %
HCT: 42.1 % (ref 36.0–46.0)
Hemoglobin: 13.2 g/dL (ref 12.0–15.0)
Immature Granulocytes: 0 %
Lymphocytes Relative: 25 %
Lymphs Abs: 1.3 10*3/uL (ref 0.7–4.0)
MCH: 29.3 pg (ref 26.0–34.0)
MCHC: 31.4 g/dL (ref 30.0–36.0)
MCV: 93.3 fL (ref 80.0–100.0)
Monocytes Absolute: 0.7 10*3/uL (ref 0.1–1.0)
Monocytes Relative: 14 %
Neutro Abs: 3 10*3/uL (ref 1.7–7.7)
Neutrophils Relative %: 57 %
Platelets: 260 10*3/uL (ref 150–400)
RBC: 4.51 MIL/uL (ref 3.87–5.11)
RDW: 14.9 % (ref 11.5–15.5)
WBC: 5.2 10*3/uL (ref 4.0–10.5)
nRBC: 0 % (ref 0.0–0.2)

## 2020-07-28 LAB — BASIC METABOLIC PANEL
Anion gap: 9 (ref 5–15)
BUN: 17 mg/dL (ref 8–23)
CO2: 27 mmol/L (ref 22–32)
Calcium: 8.8 mg/dL — ABNORMAL LOW (ref 8.9–10.3)
Chloride: 103 mmol/L (ref 98–111)
Creatinine, Ser: 0.68 mg/dL (ref 0.44–1.00)
GFR, Estimated: >60 mL/min (ref >60)
Glucose, Bld: 83 mg/dL (ref 70–99)
Potassium: 4.3 mmol/L (ref 3.5–5.1)
Sodium: 139 mmol/L (ref 135–145)

## 2020-07-28 LAB — BRAIN NATRIURETIC PEPTIDE: B Natriuretic Peptide: 78 pg/mL (ref 0.0–100.0)

## 2020-07-28 LAB — POC SARS CORONAVIRUS 2 AG -  ED: SARS Coronavirus 2 Ag: NEGATIVE

## 2020-07-28 MED ORDER — ACETAMINOPHEN 325 MG PO TABS
650.0000 mg | ORAL_TABLET | Freq: Once | ORAL | Status: AC
  Administered 2020-07-28: 650 mg via ORAL
  Filled 2020-07-28: qty 2

## 2020-07-28 MED ORDER — ALBUTEROL SULFATE HFA 108 (90 BASE) MCG/ACT IN AERS
4.0000 | INHALATION_SPRAY | Freq: Once | RESPIRATORY_TRACT | Status: AC
  Administered 2020-07-28: 4 via RESPIRATORY_TRACT
  Filled 2020-07-28: qty 6.7

## 2020-07-28 MED ORDER — DEXAMETHASONE SODIUM PHOSPHATE 10 MG/ML IJ SOLN
10.0000 mg | Freq: Once | INTRAMUSCULAR | Status: AC
  Administered 2020-07-29: 10 mg via INTRAVENOUS
  Filled 2020-07-28: qty 1

## 2020-07-28 NOTE — ED Triage Notes (Signed)
Coughing up bloody sputum

## 2020-07-28 NOTE — ED Provider Notes (Signed)
[facility] EMERGENCY DEPARTMENT Provider Note   CSN: [id number] Arrival date & time: 07/28/20  1756     History Chief Complaint  Patient presents with   Shortness of Breath    Marie West is a 65 y.o. female.  HPI      Marie West is a 65 y.o. female past medical history significant for CHF, COPD, hypertension, who presents to the Emergency Department complaining of gradually worsening fatigue, cough, shortness of breath, generalized body aches.  Symptoms began 3 days ago.  States cough worsened yesterday and  became intermittently productive of bloody sputum and occasional "clots." states that she wears a CPAP at night without relief.  She also endorses having some tightness of her chest associated with coughing and shortness of breath with exertion. Frontal headache and "pressure" behind both eyes.  No known fever.  No known sick contacts or known Covid exposures.  She is vaccinated x 2.  Denies supplemental oxygen requirement.    Past Medical History:  Diagnosis Date   Acute respiratory failure (HCC)    Arthritis    Bronchitis    CHF (congestive heart failure) (HCC)    COPD with exacerbation (HCC)    Fibromyalgia    Headache(784.0)    Hypertension    Hypertensive urgency    Hyperthyroidism    MRSA infection    Pneumonia    Retinal detachment    Sciatic leg pain    Shortness of breath     Patient Active Problem List   Diagnosis Date Noted   Acute dyspnea 11/17/2015   Depression 11/17/2015   Hypertension 11/17/2015   Non-insulin dependent type 2 diabetes mellitus (HCC) 11/17/2015   Hypothyroidism 11/17/2015   Acute exacerbation of chronic obstructive pulmonary disease (COPD) (HCC)    Hypoxia    CAP (community acquired pneumonia) 07/09/2013   Acute respiratory failure (HCC) 07/09/2013   Pleural effusion 07/09/2013   Hypertensive urgency 07/09/2013   Acute respiratory failure with hypoxia (HCC) 07/09/2013   COPD with  exacerbation (HCC) 06/01/2012   Tobacco abuse 06/01/2012   Morbid obesity (HCC) 06/01/2012   Arthritis 06/01/2012   possible CHF (congestive heart failure) 06/01/2012    Past Surgical History:  Procedure Laterality Date   KNEE SURGERY     TUBAL LIGATION       OB History   No obstetric history on file.     Family History  Problem Relation Age of Onset   Diabetes Mother    Heart disease Mother        died AMI, age 108   Heart disease Brother    Diabetes Sister     Social History   Tobacco Use   Smoking status: Current Some Day Smoker    Packs/day: 0.50    Years: 36.00    Pack years: 18.00    Types: Cigarettes    Start date: 06/04/1971   Smokeless tobacco: Never Used  Substance Use Topics   Alcohol use: Yes    Comment: occ-wine   Drug use: No    Home Medications Prior to Admission medications   Medication Sig Start Date End Date Taking? Authorizing Provider  albuterol (PROVENTIL HFA;VENTOLIN HFA) 108 (90 BASE) MCG/ACT inhaler Inhale 2 puffs into the lungs every 6 (six) hours as needed for wheezing or shortness of breath. 07/11/13   Black, Lesle Chris, NP  amLODipine (NORVASC) 10 MG tablet Take 10 mg by mouth daily. Reported on 11/17/2015    [provider]  aspirin EC 81  MG tablet Take 81 mg by mouth daily. Reported on 11/17/2015    [provider]  budesonide-formoterol (SYMBICORT) 160-4.5 MCG/ACT inhaler Inhale 2 puffs into the lungs 2 (two) times daily. 07/11/13   Black, Lesle Chris, NP  docusate sodium (COLACE) 100 MG capsule Take 1 capsule (100 mg total) by mouth every 12 (twelve) hours. While on pain medication. 09/16/16   Mesner, Barbara Cower, MD  DULoxetine (CYMBALTA) 60 MG capsule Take 60 mg by mouth daily. Reported on 11/17/2015    [provider]  furosemide (LASIX) 20 MG tablet Take 1 tablet (20 mg total) by mouth as needed for edema (swelling). 03/26/18 06/24/18  Antoine Poche, MD  ipratropium-albuterol (DUONEB) 0.5-2.5 (3) MG/3ML  SOLN Take 3 mLs by nebulization every 4 (four) hours as needed (wheezing). 09/24/15   Lavera Guise, MD  levothyroxine (SYNTHROID, LEVOTHROID) 50 MCG tablet Take 50 mcg by mouth daily before breakfast. Reported on 11/17/2015    [provider]  lisinopril-hydrochlorothiazide (PRINZIDE,ZESTORETIC) 20-12.5 MG per tablet Take 1 tablet by mouth daily. Reported on 11/17/2015    [provider]  loratadine (CLARITIN) 10 MG tablet Take 10 mg by mouth daily.    [provider]  Melatonin 10 MG CAPS Take 1 capsule by mouth at bedtime.    [provider]  meloxicam (MOBIC) 7.5 MG tablet Take 1 tablet (7.5 mg total) by mouth daily. 02/01/17   Robinson, [place] N, PA-C  metFORMIN (GLUCOPHAGE) 500 MG tablet Take 500 mg by mouth at bedtime. Reported on 11/17/2015    [provider]  oxyCODONE-acetaminophen (PERCOCET/ROXICET) 5-325 MG tablet Take 1-2 tablets by mouth every 4 (four) hours as needed for severe pain. 09/16/16   Mesner, Barbara Cower, MD  oxyCODONE-acetaminophen (PERCOCET/ROXICET) 5-325 MG tablet Take 1-2 tablets by mouth every 8 (eight) hours as needed for severe pain. 09/16/16   Mesner, Barbara Cower, MD  ZETIA 10 MG tablet Take 1 tablet by mouth daily. 09/02/16   [provider]    Allergies    Bee venom  Review of Systems   Review of Systems  Constitutional: Positive for fatigue. Negative for appetite change, chills and fever.  HENT: Positive for congestion. Negative for sore throat and trouble swallowing.   Respiratory: Positive for cough and chest tightness. Negative for shortness of breath and wheezing.   Cardiovascular: Negative for chest pain.  Gastrointestinal: Negative for abdominal pain, diarrhea, nausea and vomiting.  Genitourinary: Negative for difficulty urinating and dysuria.  Musculoskeletal: Positive for myalgias. Negative for arthralgias, neck pain and neck stiffness.  Skin: Negative for rash.  Neurological: Positive for headaches. Negative for  dizziness, syncope, weakness and numbness.  Hematological: Negative for adenopathy.  Psychiatric/Behavioral: Negative for confusion.    Physical Exam Updated Vital Signs BP (!) 128/107    Pulse 75    Temp 98.7 F (37.1 C)    Resp (!) 21    SpO2 90%   Physical Exam Vitals and nursing note reviewed.  Constitutional:      General: She is not in acute distress.    Appearance: She is obese. She is not ill-appearing.     Comments: Pt is uncomfortable appearing, nontoxic  HENT:     Mouth/Throat:     Mouth: Mucous membranes are moist.  Eyes:     Conjunctiva/sclera: Conjunctivae normal.     Pupils: Pupils are equal, round, and reactive to light.  Cardiovascular:     Rate and Rhythm: Normal rate and regular rhythm.     Pulses: Normal pulses.  Pulmonary:     Breath sounds: Wheezing present.     Comments: Mild to moderate increased work of breathing.  Expiratory wheezes throughout on exam.  No rales. Abdominal:     General: There is no distension.     Palpations: Abdomen is soft.     Tenderness: There is no abdominal tenderness.  Musculoskeletal:        General: Normal range of motion.     Cervical back: Normal range of motion.     Right lower leg: No edema.     Left lower leg: No edema.  Skin:    General: Skin is warm.     Capillary Refill: Capillary refill takes less than 2 seconds.     Findings: No rash.  Neurological:     General: No focal deficit present.     Mental Status: She is alert.  Psychiatric:        Mood and Affect: Mood normal.     ED Results / Procedures / Treatments   Labs (all labs ordered are listed, but only abnormal results are displayed) Labs Reviewed  BASIC METABOLIC PANEL - Abnormal; Notable for the following components:      Result Value   Calcium 8.8 (*)    All other components within normal limits  SARS CORONAVIRUS 2 (TAT 6-24 HRS)  BRAIN NATRIURETIC PEPTIDE  CBC WITH DIFFERENTIAL/PLATELET  BLOOD GAS, VENOUS  POC SARS CORONAVIRUS 2 AG -  ED     EKG EKG Interpretation  Date/Time:  Tuesday July 28 2020 21:18:50 EST Ventricular Rate:  78 PR Interval:    QRS Duration: 99 QT Interval:  413 QTC Calculation: 471 R Axis:   57 Text Interpretation: Sinus rhythm Low voltage, precordial leads Probable anteroseptal infarct, old Baseline wander in lead(s) II since last tracing no significant change Confirmed by Mancel Bale [id number]) on 07/28/2020 10:10:12 PM   Radiology DG Chest Port 1 View  Result Date: 07/28/2020 CLINICAL DATA:  Shortness of breath EXAM: PORTABLE CHEST 1 VIEW COMPARISON:  September 16, 2016 FINDINGS: FINDINGS The heart size and mediastinal contours are within normal limits. There is prominence of the central pulmonary vasculature. The visualized skeletal structures are unremarkable. IMPRESSION: Mild pulmonary vascular congestion Electronically Signed   By: Jonna Clark M.D.   On: 07/28/2020 20:04    Procedures Procedures   Medications Ordered in ED Medications  dexamethasone (DECADRON) injection 10 mg (has no administration in time range)  albuterol (VENTOLIN HFA) 108 (90 Base) MCG/ACT inhaler 4 puff (4 puffs Inhalation Given 07/28/20 2154)  acetaminophen (TYLENOL) tablet 650 mg (650 mg Oral Given 07/28/20 2319)    ED Course  I have reviewed the triage vital signs and the nursing notes.  Pertinent labs & imaging results that were available during my care of the patient were reviewed by me and considered in my medical decision making (see chart for details).    MDM Rules/Calculators/A&P                          Patient here with complaint of shortness of breath, cough and hemoptysis.  Symptoms began 3 days ago and noticed hemoptysis yesterday.  She has history of COPD wears CPAP but no supplemental oxygen requirement.  No known Covid exposures.  She is vaccinated x 2  On exam, she has some increased work of breathing and expiratory wheezes throughout.  When I entered the exam room, patient's O2 sat was 89% on  room air.  I placed her on 2 L oxygen by nasal cannula and O2 sat rose to 93%.  On recheck, patient resting comfortably.  she has been given 4 puffs of albuterol via MDI.  She continues to have mild expiratory wheezes, but improved after the albuterol.  Reports shortness of breath has improved as well.    Chest x-ray shows mild pulmonary vascular congestion, EKG without acute ischemic changes.  No leukocytosis or fever to suggest infectious process.  Electrolytes and BNP unremarkable.  Covid antigen test negative, PCR testing pending.  2250 O2 removed, patient maintaining O2 saturation of 91%.  Will check pulse ox while ambulating.  2330 after some time without supplemental oxygen, patient's O2 sat at 87% on room air.  Ambulated in the exam room and O2 sat dropped further to 85%.  She was placed back on 2 L by nasal cannula and oxygen now 93%.  Doubt cardiac process, patient likely has COPD exacerbation, but Covid also possible.  Steroids ordered.  Given her hypoxia, will consult hospitalist for admission.  Discussed findings with [facility] hospitalist, Dr, Carren Rang who agrees to admit.    Final Clinical Impression(s) / ED Diagnoses Final diagnoses:  Hypoxia    Rx / DC Orders ED Discharge Orders    None       Pauline Aus, PA-C 07/29/20 Janine Limbo, MD 07/29/20 1415

## 2020-07-28 NOTE — ED Notes (Signed)
Ambulated pt. 30 ft. Pts. O2 dropped to 85 on RA.

## 2020-07-28 NOTE — ED Provider Notes (Incomplete)
[facility] EMERGENCY DEPARTMENT Provider Note   CSN: [id number] Arrival date & time: 07/28/20  1756     History Chief Complaint  Patient presents with  . Shortness of Breath    Marie West is a 65 y.o. female.  HPI      Marie West is a 65 y.o. female past medical history significant for CHF, COPD, hypertension, who presents to the Emergency Department complaining of gradually worsening fatigue, cough, shortness of breath, generalized body aches.  Symptoms began 3 days ago.  States cough worsened yesterday and  became intermittently productive of bloody sputum and occasional "clots." states that she wears a CPAP at night without relief.  She also endorses having some tightness of her chest associated with coughing and shortness of breath with exertion. Frontal headache and "pressure" behind both eyes.  No known fever.  No known sick contacts or known Covid exposures.  She is vaccinated x 2.  Denies supplemental oxygen requirement.    Past Medical History:  Diagnosis Date  . Acute respiratory failure (HCC)   . Arthritis   . Bronchitis   . CHF (congestive heart failure) (HCC)   . COPD with exacerbation (HCC)   . Fibromyalgia   . Headache(784.0)   . Hypertension   . Hypertensive urgency   . Hyperthyroidism   . MRSA infection   . Pneumonia   . Retinal detachment   . Sciatic leg pain   . Shortness of breath     Patient Active Problem List   Diagnosis Date Noted  . Acute dyspnea 11/17/2015  . Depression 11/17/2015  . Hypertension 11/17/2015  . Non-insulin dependent type 2 diabetes mellitus (HCC) 11/17/2015  . Hypothyroidism 11/17/2015  . Acute exacerbation of chronic obstructive pulmonary disease (COPD) (HCC)   . Hypoxia   . CAP (community acquired pneumonia) 07/09/2013  . Acute respiratory failure (HCC) 07/09/2013  . Pleural effusion 07/09/2013  . Hypertensive urgency 07/09/2013  . Acute respiratory failure with hypoxia (HCC) 07/09/2013  . COPD with  exacerbation (HCC) 06/01/2012  . Tobacco abuse 06/01/2012  . Morbid obesity (HCC) 06/01/2012  . Arthritis 06/01/2012  . possible CHF (congestive heart failure) 06/01/2012    Past Surgical History:  Procedure Laterality Date  . KNEE SURGERY    . TUBAL LIGATION       OB History   No obstetric history on file.     Family History  Problem Relation Age of Onset  . Diabetes Mother   . Heart disease Mother        died AMI, age 38  . Heart disease Brother   . Diabetes Sister     Social History   Tobacco Use  . Smoking status: Current Some Day Smoker    Packs/day: 0.50    Years: 36.00    Pack years: 18.00    Types: Cigarettes    Start date: 06/04/1971  . Smokeless tobacco: Never Used  Substance Use Topics  . Alcohol use: Yes    Comment: occ-wine  . Drug use: No    Home Medications Prior to Admission medications   Medication Sig Start Date End Date Taking? Authorizing Provider  albuterol (PROVENTIL HFA;VENTOLIN HFA) 108 (90 BASE) MCG/ACT inhaler Inhale 2 puffs into the lungs every 6 (six) hours as needed for wheezing or shortness of breath. 07/11/13   Black, Lesle Chris, NP  amLODipine (NORVASC) 10 MG tablet Take 10 mg by mouth daily. Reported on 11/17/2015    [provider]  aspirin EC 81  MG tablet Take 81 mg by mouth daily. Reported on 11/17/2015    [provider]  budesonide-formoterol (SYMBICORT) 160-4.5 MCG/ACT inhaler Inhale 2 puffs into the lungs 2 (two) times daily. 07/11/13   Black, Lesle Chris, NP  docusate sodium (COLACE) 100 MG capsule Take 1 capsule (100 mg total) by mouth every 12 (twelve) hours. While on pain medication. 09/16/16   Mesner, Barbara Cower, MD  DULoxetine (CYMBALTA) 60 MG capsule Take 60 mg by mouth daily. Reported on 11/17/2015    [provider]  furosemide (LASIX) 20 MG tablet Take 1 tablet (20 mg total) by mouth as needed for edema (swelling). 03/26/18 06/24/18  Antoine Poche, MD  ipratropium-albuterol (DUONEB) 0.5-2.5 (3) MG/3ML  SOLN Take 3 mLs by nebulization every 4 (four) hours as needed (wheezing). 09/24/15   Lavera Guise, MD  levothyroxine (SYNTHROID, LEVOTHROID) 50 MCG tablet Take 50 mcg by mouth daily before breakfast. Reported on 11/17/2015    [provider]  lisinopril-hydrochlorothiazide (PRINZIDE,ZESTORETIC) 20-12.5 MG per tablet Take 1 tablet by mouth daily. Reported on 11/17/2015    [provider]  loratadine (CLARITIN) 10 MG tablet Take 10 mg by mouth daily.    [provider]  Melatonin 10 MG CAPS Take 1 capsule by mouth at bedtime.    [provider]  meloxicam (MOBIC) 7.5 MG tablet Take 1 tablet (7.5 mg total) by mouth daily. 02/01/17   Robinson, [place] N, PA-C  metFORMIN (GLUCOPHAGE) 500 MG tablet Take 500 mg by mouth at bedtime. Reported on 11/17/2015    [provider]  oxyCODONE-acetaminophen (PERCOCET/ROXICET) 5-325 MG tablet Take 1-2 tablets by mouth every 4 (four) hours as needed for severe pain. 09/16/16   Mesner, Barbara Cower, MD  oxyCODONE-acetaminophen (PERCOCET/ROXICET) 5-325 MG tablet Take 1-2 tablets by mouth every 8 (eight) hours as needed for severe pain. 09/16/16   Mesner, Barbara Cower, MD  ZETIA 10 MG tablet Take 1 tablet by mouth daily. 09/02/16   [provider]    Allergies    Bee venom  Review of Systems   Review of Systems  Constitutional: Positive for fatigue. Negative for appetite change, chills and fever.  HENT: Positive for congestion. Negative for sore throat and trouble swallowing.   Respiratory: Positive for cough and chest tightness. Negative for shortness of breath and wheezing.   Cardiovascular: Negative for chest pain.  Gastrointestinal: Negative for abdominal pain, diarrhea, nausea and vomiting.  Genitourinary: Negative for difficulty urinating and dysuria.  Musculoskeletal: Positive for myalgias. Negative for arthralgias, neck pain and neck stiffness.  Skin: Negative for rash.  Neurological: Positive for headaches. Negative for  dizziness, syncope, weakness and numbness.  Hematological: Negative for adenopathy.  Psychiatric/Behavioral: Negative for confusion.    Physical Exam Updated Vital Signs BP (!) 128/107   Pulse 75   Temp 98.7 F (37.1 C)   Resp (!) 21   SpO2 90%   Physical Exam Vitals and nursing note reviewed.  Constitutional:      General: She is not in acute distress.    Appearance: She is obese. She is not ill-appearing.     Comments: Pt is uncomfortable appearing, nontoxic  HENT:     Mouth/Throat:     Mouth: Mucous membranes are moist.  Eyes:     Conjunctiva/sclera: Conjunctivae normal.     Pupils: Pupils are equal, round, and reactive to light.  Cardiovascular:     Rate and Rhythm: Normal rate and regular rhythm.     Pulses: Normal pulses.  Pulmonary:  Breath sounds: Wheezing present.     Comments: Mild to moderate increased work of breathing.  Expiratory wheezes throughout on exam.  No rales. Abdominal:     General: There is no distension.     Palpations: Abdomen is soft.     Tenderness: There is no abdominal tenderness.  Musculoskeletal:        General: Normal range of motion.     Cervical back: Normal range of motion.     Right lower leg: No edema.     Left lower leg: No edema.  Skin:    General: Skin is warm.     Capillary Refill: Capillary refill takes less than 2 seconds.     Findings: No rash.  Neurological:     General: No focal deficit present.     Mental Status: She is alert.  Psychiatric:        Mood and Affect: Mood normal.     ED Results / Procedures / Treatments   Labs (all labs ordered are listed, but only abnormal results are displayed) Labs Reviewed  BASIC METABOLIC PANEL - Abnormal; Notable for the following components:      Result Value   Calcium 8.8 (*)    All other components within normal limits  SARS CORONAVIRUS 2 (TAT 6-24 HRS)  BRAIN NATRIURETIC PEPTIDE  CBC WITH DIFFERENTIAL/PLATELET  POC SARS CORONAVIRUS 2 AG -  ED    EKG EKG  Interpretation  Date/Time:  Tuesday July 28 2020 21:18:50 EST Ventricular Rate:  78 PR Interval:    QRS Duration: 99 QT Interval:  413 QTC Calculation: 471 R Axis:   57 Text Interpretation: Sinus rhythm Low voltage, precordial leads Probable anteroseptal infarct, old Baseline wander in lead(s) II since last tracing no significant change Confirmed by Mancel Bale [id number]) on 07/28/2020 10:10:12 PM   Radiology DG Chest Port 1 View  Result Date: 07/28/2020 CLINICAL DATA:  Shortness of breath EXAM: PORTABLE CHEST 1 VIEW COMPARISON:  September 16, 2016 FINDINGS: FINDINGS The heart size and mediastinal contours are within normal limits. There is prominence of the central pulmonary vasculature. The visualized skeletal structures are unremarkable. IMPRESSION: Mild pulmonary vascular congestion Electronically Signed   By: Jonna Clark M.D.   On: 07/28/2020 20:04    Procedures Procedures {Remember to document critical care time when appropriate:1}  Medications Ordered in ED Medications - No data to display  ED Course  I have reviewed the triage vital signs and the nursing notes.  Pertinent labs & imaging results that were available during my care of the patient were reviewed by me and considered in my medical decision making (see chart for details).    MDM Rules/Calculators/A&P                          Patient here with complaint of shortness of breath, cough and hemoptysis.  Symptoms began 3 days ago and noticed hemoptysis yesterday.  She has history of COPD wears CPAP but no supplemental oxygen requirement.  No known Covid exposures.  She is vaccinated x 2  On exam, she has some increased work of breathing and expiratory wheezes throughout.  When I entered the exam room, patient's O2 sat was 89% on room air.  I placed her on 2 L oxygen by nasal cannula and O2 sat rose to 93%.  On recheck, patient resting comfortably.  she has been given 4 puffs of albuterol via MDI.  She continues to have  mild expiratory  wheezes, but improved after the albuterol.  Reports shortness of breath has improved as well.    Chest x-ray shows mild pulmonary vascular congestion, EKG without acute ischemic changes.  No leukocytosis or fever to suggest infectious process.  Electrolytes and BNP unremarkable.  Covid antigen test negative, PCR testing pending.  2250 O2 removed, patient maintaining O2 saturation of 91%.  Will check pulse ox while ambulating.  2330 after some time without supplemental oxygen, patient's O2 sat at 87% on room air.  Ambulated in the exam room and O2 sat dropped further to 85%.  She was placed back on 2 L by nasal cannula and oxygen now 93%.  Doubt cardiac process, patient likely has COPD exacerbation, but Covid also possible.  Steroids ordered.  Given her hypoxia, will consult hospitalist for admission.  Final Clinical Impression(s) / ED Diagnoses Final diagnoses:  None    Rx / DC Orders ED Discharge Orders    None

## 2020-07-28 NOTE — ED Provider Notes (Signed)
  Face-to-face evaluation   History: Evaluation of a cough, productive of mucus which is discolored green, and bloody sometimes.  She denies vomiting or diarrhea.  She has had mild nausea today.  No known sick contacts.  She has had Covid vaccines.  She denies chest pain or abdominal pain.  She is a current smoker.  She does not use oxygen at home but does use CPAP.  Physical exam: Obese elderly female who is alert and calm.  She is not in respiratory distress.  Lungs with decreased air movement bilaterally and scattered rhonchi and wheezes.  There is no increased work of breathing.  There is no respiratory distress.  Medical screening examination/treatment/procedure(s) were conducted as a shared visit with non-physician practitioner(s) and myself.  I personally evaluated the patient during the encounter    Mancel Bale, MD 07/29/20 1415

## 2020-07-28 NOTE — ED Triage Notes (Signed)
Cough, shortness of breath for 2 days

## 2020-07-29 DIAGNOSIS — J42 Unspecified chronic bronchitis: Secondary | ICD-10-CM

## 2020-07-29 DIAGNOSIS — J441 Chronic obstructive pulmonary disease with (acute) exacerbation: Secondary | ICD-10-CM | POA: Diagnosis not present

## 2020-07-29 DIAGNOSIS — I5031 Acute diastolic (congestive) heart failure: Secondary | ICD-10-CM

## 2020-07-29 DIAGNOSIS — E119 Type 2 diabetes mellitus without complications: Secondary | ICD-10-CM

## 2020-07-29 DIAGNOSIS — J9601 Acute respiratory failure with hypoxia: Secondary | ICD-10-CM

## 2020-07-29 DIAGNOSIS — J449 Chronic obstructive pulmonary disease, unspecified: Secondary | ICD-10-CM

## 2020-07-29 DIAGNOSIS — Z720 Tobacco use: Secondary | ICD-10-CM

## 2020-07-29 LAB — COMPREHENSIVE METABOLIC PANEL
ALT: 14 U/L (ref 0–44)
AST: 14 U/L — ABNORMAL LOW (ref 15–41)
Albumin: 3.4 g/dL — ABNORMAL LOW (ref 3.5–5.0)
Alkaline Phosphatase: 58 U/L (ref 38–126)
Anion gap: 9 (ref 5–15)
BUN: 19 mg/dL (ref 8–23)
CO2: 27 mmol/L (ref 22–32)
Calcium: 8.8 mg/dL — ABNORMAL LOW (ref 8.9–10.3)
Chloride: 103 mmol/L (ref 98–111)
Creatinine, Ser: 0.71 mg/dL (ref 0.44–1.00)
GFR, Estimated: >60 mL/min (ref >60)
Glucose, Bld: 165 mg/dL — ABNORMAL HIGH (ref 70–99)
Potassium: 4.5 mmol/L (ref 3.5–5.1)
Sodium: 139 mmol/L (ref 135–145)
Total Bilirubin: 0.5 mg/dL (ref 0.3–1.2)
Total Protein: 7.2 g/dL (ref 6.5–8.1)

## 2020-07-29 LAB — BASIC METABOLIC PANEL
Anion gap: 9 (ref 5–15)
BUN: 18 mg/dL (ref 8–23)
CO2: 25 mmol/L (ref 22–32)
Calcium: 8.9 mg/dL (ref 8.9–10.3)
Chloride: 104 mmol/L (ref 98–111)
Creatinine, Ser: 0.81 mg/dL (ref 0.44–1.00)
GFR, Estimated: >60 mL/min (ref >60)
Glucose, Bld: 230 mg/dL — ABNORMAL HIGH (ref 70–99)
Potassium: 4.3 mmol/L (ref 3.5–5.1)
Sodium: 138 mmol/L (ref 135–145)

## 2020-07-29 LAB — CBC
HCT: 40.7 % (ref 36.0–46.0)
Hemoglobin: 13 g/dL (ref 12.0–15.0)
MCH: 29.8 pg (ref 26.0–34.0)
MCHC: 31.9 g/dL (ref 30.0–36.0)
MCV: 93.3 fL (ref 80.0–100.0)
Platelets: 261 10*3/uL (ref 150–400)
RBC: 4.36 MIL/uL (ref 3.87–5.11)
RDW: 14.7 % (ref 11.5–15.5)
WBC: 7 10*3/uL (ref 4.0–10.5)
nRBC: 0 % (ref 0.0–0.2)

## 2020-07-29 LAB — CBG MONITORING, ED
Glucose-Capillary: 151 mg/dL — ABNORMAL HIGH (ref 70–99)
Glucose-Capillary: 175 mg/dL — ABNORMAL HIGH (ref 70–99)

## 2020-07-29 LAB — BLOOD GAS, VENOUS
Acid-Base Excess: 4.1 mmol/L — ABNORMAL HIGH (ref 0.0–2.0)
Bicarbonate: 26.8 mmol/L (ref 20.0–28.0)
Drawn by: 442
FIO2: 28
O2 Saturation: 69.6 %
Patient temperature: 37
pCO2, Ven: 48.7 mmHg (ref 44.0–60.0)
pH, Ven: 7.389 (ref 7.250–7.430)
pO2, Ven: 38.4 mmHg (ref 32.0–45.0)

## 2020-07-29 LAB — TSH: TSH: 3.55 u[IU]/mL (ref 0.350–4.500)

## 2020-07-29 LAB — TROPONIN I (HIGH SENSITIVITY): Troponin I (High Sensitivity): 5 ng/L (ref <18)

## 2020-07-29 LAB — GLUCOSE, CAPILLARY
Glucose-Capillary: 219 mg/dL — ABNORMAL HIGH (ref 70–99)
Glucose-Capillary: 183 mg/dL — ABNORMAL HIGH (ref 70–99)
Glucose-Capillary: 220 mg/dL — ABNORMAL HIGH (ref 70–99)

## 2020-07-29 LAB — SARS CORONAVIRUS 2 (TAT 6-24 HRS): SARS Coronavirus 2: POSITIVE — AB

## 2020-07-29 LAB — HEMOGLOBIN A1C
Hgb A1c MFr Bld: 6.6 % — ABNORMAL HIGH (ref 4.8–5.6)
Mean Plasma Glucose: 142.72 mg/dL

## 2020-07-29 LAB — MAGNESIUM: Magnesium: 1.9 mg/dL (ref 1.7–2.4)

## 2020-07-29 LAB — HIV ANTIBODY (ROUTINE TESTING W REFLEX): HIV Screen 4th Generation wRfx: NONREACTIVE

## 2020-07-29 MED ORDER — ORAL CARE MOUTH RINSE
15.0000 mL | Freq: Two times a day (BID) | OROMUCOSAL | Status: DC
  Administered 2020-07-29 – 2020-07-31 (×3): 15 mL via OROMUCOSAL

## 2020-07-29 MED ORDER — FUROSEMIDE 10 MG/ML IJ SOLN
40.0000 mg | Freq: Two times a day (BID) | INTRAMUSCULAR | Status: DC
  Administered 2020-07-29 – 2020-07-31 (×6): 40 mg via INTRAVENOUS
  Filled 2020-07-29 (×7): qty 4

## 2020-07-29 MED ORDER — ONDANSETRON HCL 4 MG/2ML IJ SOLN: 4.0000 mg | Freq: Four times a day (QID) | INTRAMUSCULAR | Status: DC | PRN

## 2020-07-29 MED ORDER — INSULIN ASPART 100 UNIT/ML ~~LOC~~ SOLN
0.0000 [IU] | Freq: Every day | SUBCUTANEOUS | Status: DC
  Administered 2020-07-29 – 2020-07-31 (×3): 2 [IU] via SUBCUTANEOUS

## 2020-07-29 MED ORDER — LEVOTHYROXINE SODIUM 50 MCG PO TABS
50.0000 ug | ORAL_TABLET | Freq: Every day | ORAL | Status: DC
  Administered 2020-07-29 – 2020-08-01 (×4): 50 ug via ORAL
  Filled 2020-07-29 (×4): qty 1

## 2020-07-29 MED ORDER — HYDROCHLOROTHIAZIDE 12.5 MG PO CAPS
12.5000 mg | ORAL_CAPSULE | Freq: Every day | ORAL | Status: DC
  Administered 2020-07-29 – 2020-07-31 (×3): 12.5 mg via ORAL
  Filled 2020-07-29 (×3): qty 1

## 2020-07-29 MED ORDER — MELOXICAM 7.5 MG PO TABS
7.5000 mg | ORAL_TABLET | Freq: Every day | ORAL | Status: DC
  Administered 2020-07-29 – 2020-08-01 (×4): 7.5 mg via ORAL
  Filled 2020-07-29 (×7): qty 1

## 2020-07-29 MED ORDER — OXYCODONE-ACETAMINOPHEN 5-325 MG PO TABS
1.0000 | ORAL_TABLET | ORAL | Status: DC | PRN
  Administered 2020-07-29 – 2020-07-30 (×3): 1 via ORAL
  Administered 2020-07-30 – 2020-07-31 (×3): 2 via ORAL
  Administered 2020-07-31: 1 via ORAL
  Administered 2020-08-01 (×2): 2 via ORAL
  Filled 2020-07-29 (×3): qty 1
  Filled 2020-07-29 (×2): qty 2
  Filled 2020-07-29: qty 1
  Filled 2020-07-29 (×3): qty 2

## 2020-07-29 MED ORDER — AMLODIPINE BESYLATE 5 MG PO TABS
10.0000 mg | ORAL_TABLET | Freq: Every day | ORAL | Status: DC
  Administered 2020-07-29 – 2020-08-01 (×4): 10 mg via ORAL
  Filled 2020-07-29 (×4): qty 2

## 2020-07-29 MED ORDER — MOMETASONE FURO-FORMOTEROL FUM 200-5 MCG/ACT IN AERO
2.0000 | INHALATION_SPRAY | Freq: Two times a day (BID) | RESPIRATORY_TRACT | Status: DC
  Administered 2020-07-29 – 2020-08-01 (×7): 2 via RESPIRATORY_TRACT
  Filled 2020-07-29 (×2): qty 8.8

## 2020-07-29 MED ORDER — LISINOPRIL 10 MG PO TABS
20.0000 mg | ORAL_TABLET | Freq: Every day | ORAL | Status: DC
  Administered 2020-07-29: 20 mg via ORAL
  Filled 2020-07-29: qty 2

## 2020-07-29 MED ORDER — ACETAMINOPHEN 650 MG RE SUPP: 650.0000 mg | Freq: Four times a day (QID) | RECTAL | Status: DC | PRN

## 2020-07-29 MED ORDER — MELATONIN 3 MG PO TABS
9.0000 mg | ORAL_TABLET | Freq: Every day | ORAL | Status: DC
Start: 2020-07-29 — End: 2020-08-01
  Administered 2020-07-29 – 2020-07-31 (×3): 9 mg via ORAL
  Filled 2020-07-29 (×3): qty 3

## 2020-07-29 MED ORDER — MELATONIN 5 MG PO TABS
10.0000 mg | ORAL_TABLET | Freq: Every day | ORAL | Status: DC
  Filled 2020-07-29 (×3): qty 2

## 2020-07-29 MED ORDER — HEPARIN SODIUM (PORCINE) 5000 UNIT/ML IJ SOLN
5000.0000 [IU] | Freq: Three times a day (TID) | INTRAMUSCULAR | Status: DC
  Administered 2020-07-29 – 2020-08-01 (×10): 5000 [IU] via SUBCUTANEOUS
  Filled 2020-07-29 (×11): qty 1

## 2020-07-29 MED ORDER — METHYLPREDNISOLONE SODIUM SUCC 125 MG IJ SOLR
80.0000 mg | Freq: Two times a day (BID) | INTRAMUSCULAR | Status: DC
  Administered 2020-07-29: 80 mg via INTRAVENOUS
  Filled 2020-07-29: qty 2

## 2020-07-29 MED ORDER — ASPIRIN EC 81 MG PO TBEC
81.0000 mg | DELAYED_RELEASE_TABLET | Freq: Every day | ORAL | Status: DC
  Administered 2020-07-29 – 2020-08-01 (×4): 81 mg via ORAL
  Filled 2020-07-29 (×4): qty 1

## 2020-07-29 MED ORDER — ALBUTEROL SULFATE HFA 108 (90 BASE) MCG/ACT IN AERS
2.0000 | INHALATION_SPRAY | RESPIRATORY_TRACT | Status: DC
  Administered 2020-07-29 (×2): 2 via RESPIRATORY_TRACT

## 2020-07-29 MED ORDER — SODIUM CHLORIDE 0.9 % IV SOLN
500.0000 mg | INTRAVENOUS | Status: DC
  Administered 2020-07-29: 500 mg via INTRAVENOUS
  Filled 2020-07-29: qty 500

## 2020-07-29 MED ORDER — ACETAMINOPHEN 325 MG PO TABS: 650.0000 mg | ORAL_TABLET | Freq: Four times a day (QID) | ORAL | Status: DC | PRN

## 2020-07-29 MED ORDER — DULOXETINE HCL 60 MG PO CPEP
60.0000 mg | ORAL_CAPSULE | Freq: Every day | ORAL | Status: DC
  Administered 2020-07-29 – 2020-08-01 (×4): 60 mg via ORAL
  Filled 2020-07-29 (×4): qty 1

## 2020-07-29 MED ORDER — LISINOPRIL 10 MG PO TABS
10.0000 mg | ORAL_TABLET | Freq: Every day | ORAL | Status: DC
Start: 2020-07-30 — End: 2020-08-01
  Administered 2020-07-30 – 2020-08-01 (×3): 10 mg via ORAL
  Filled 2020-07-29 (×3): qty 1

## 2020-07-29 MED ORDER — INSULIN ASPART 100 UNIT/ML ~~LOC~~ SOLN
0.0000 [IU] | Freq: Three times a day (TID) | SUBCUTANEOUS | Status: DC
  Administered 2020-07-29 (×2): 3 [IU] via SUBCUTANEOUS
  Administered 2020-07-29: 5 [IU] via SUBCUTANEOUS
  Administered 2020-07-30: 2 [IU] via SUBCUTANEOUS
  Administered 2020-07-30: 3 [IU] via SUBCUTANEOUS
  Administered 2020-07-30: 5 [IU] via SUBCUTANEOUS
  Administered 2020-07-31: 3 [IU] via SUBCUTANEOUS
  Administered 2020-07-31: 5 [IU] via SUBCUTANEOUS
  Administered 2020-07-31 – 2020-08-01 (×2): 3 [IU] via SUBCUTANEOUS
  Filled 2020-07-29: qty 1

## 2020-07-29 MED ORDER — LISINOPRIL-HYDROCHLOROTHIAZIDE 20-12.5 MG PO TABS: 1.0000 | ORAL_TABLET | Freq: Every day | ORAL | Status: DC

## 2020-07-29 MED ORDER — ONDANSETRON HCL 4 MG PO TABS: 4.0000 mg | ORAL_TABLET | Freq: Four times a day (QID) | ORAL | Status: DC | PRN

## 2020-07-29 MED ORDER — POLYETHYLENE GLYCOL 3350 17 G PO PACK: 17.0000 g | PACK | Freq: Every day | ORAL | Status: DC | PRN

## 2020-07-29 MED ORDER — PREDNISONE 20 MG PO TABS
40.0000 mg | ORAL_TABLET | Freq: Every day | ORAL | Status: DC
  Filled 2020-07-29: qty 2

## 2020-07-29 MED ORDER — ALBUTEROL SULFATE (2.5 MG/3ML) 0.083% IN NEBU: 3.0000 mL | INHALATION_SOLUTION | RESPIRATORY_TRACT | Status: DC | PRN

## 2020-07-29 MED ORDER — EZETIMIBE 10 MG PO TABS
10.0000 mg | ORAL_TABLET | Freq: Every day | ORAL | Status: DC
  Administered 2020-07-29 – 2020-08-01 (×4): 10 mg via ORAL
  Filled 2020-07-29 (×4): qty 1

## 2020-07-29 NOTE — ED Notes (Signed)
Pt given cup of diet ginger ale. No other requests at this time. Call bell within reach, bed in low position. Will continue to monitor.

## 2020-07-29 NOTE — Progress Notes (Signed)
PROGRESS NOTE    Marie West  DDU:[id number] DOB: 1955/07/09 DOA: 07/28/2020 PCP: Elmer Picker, [facility]    Brief Narrative:  Marie West was admitted to the hospital with a working diagnosis of acute hypoxic respiratory failure due to CHF exacerbation.  65 year old female past medical history for hypothyroidism, hypertension, COPD, heart failure, tobacco abuse and obesity class III who presented with dyspnea.  Patient reported 3 days of productive cough, subjective fevers, and increased sleepiness.  Worsening dyspnea on exertion and dizziness.  On her initial physical examination temperature was 98.7, heart rate 73, respiratory 25, blood pressure 146/96, oxygen saturation 88% on room air.  Her lungs had diffuse wheezing, no rales or rhonchi, heart S1-S2, present rhythmic, abdomen protuberant, no lower extremity edema.  Sodium 139, potassium 4.3, chloride 103, bicarb 27, glucose 83 BUN 17, creatinine 0.68, white count 5.2, hemoglobin 13.2, hematocrit 42.1, platelets 260.  Venous blood gas pH 7.38.  SARS COVID-19 antigen negative.  Chest radiograph with cardiomegaly, positive hilar vascular congestion, predominantly on the left.  EKG 78 bpm, normal axis, normal intervals, sinus rhythm, poor R wave progression, no ST segment or T wave changes.  Assessment & Plan:   Principal Problem:   Acute diastolic CHF (congestive heart failure) (HCC) Active Problems:   Tobacco abuse   Morbid obesity (HCC)   Acute respiratory failure with hypoxia (HCC)   Non-insulin dependent type 2 diabetes mellitus (HCC)   Hypothyroidism   COPD (chronic obstructive pulmonary disease) (HCC)   1. Acute hypoxemic respiratory failure due to CHF exacerbation. Chest film with signs of pulmonary edema, positive cardiomegaly, at home with orthopnea and PND.  Warm extremities.   Start patient on furosemide 40 mg IV q12 H, strict in and out. Repeat echocardiogram.  Continue blood pressure monitoring.    2. COPD, tobacco abuse/ no exacerbation. Continue supplemental 02 per [place] and continue as needed bronchodilator albuterol and scheduled dulera.   3. Hypothyroid. Continue with levothyroxine.   4. T2DM/ dyslipidemia. Fasting glucose is 165 mg/dl, will continue glucose control and monitoring with insulin sliding scale.   Continue with ezetimibe   5. Obesity class 3. Calculated BMI is 62.73   6. HTN. Continue blood pressure control with amlodipine. lisinopril and HCTZ.   Patient continue to be at high risk for worsening heart failure.   Status is: Observation  The patient will require care spanning > 2 midnights and should be moved to inpatient because: IV treatments appropriate due to intensity of illness or inability to take PO  Dispo: The patient is from: Home              Anticipated d/c is to: Home              Anticipated d/c date is: 3 days              Patient currently is not medically stable to d/c.   Difficult to place patient No  DVT prophylaxis: Enoxaparin   Code Status:   full  Family Communication:  No family at the bedside      Subjective: Patient continue to have dyspnea, mild improvement but not yet back to baseline, no nausea or vomiting, no chest pain,   Objective: Vitals:   07/29/20 0500 07/29/20 0617 07/29/20 0700 07/29/20 0744  BP: 129/62 (!) 119/54 (!) 132/56   Pulse: 70 67 64   Resp: 15 18 15    Temp:      SpO2: 92% 93% 96% 93%   No intake  or output data in the 24 hours ending 07/29/20 0814 There were no vitals filed for this visit.  Examination:   General: Not in pain, deconditioned  Neurology: Awake and alert, non focal  E ENT: mild pallor, no icterus, oral mucosa moist. Short wide neck, difficult to visualize jugular vein.  Cardiovascular: No JVD. S1-S2 present, rhythmic, no gallops, rubs, or murmurs. No lower extremity edema. Pulmonary: positive breath sounds bilaterally, mild expiratory wheezing, scattered rales.  Gastrointestinal.  Abdomen protuberant Skin. No rashes Musculoskeletal: no joint deformities     Data Reviewed: I have personally reviewed following labs and imaging studies  CBC: Recent Labs  Lab 07/28/20 2009 07/29/20 0415  WBC 5.2 7.0  NEUTROABS 3.0  --   HGB 13.2 13.0  HCT 42.1 40.7  MCV 93.3 93.3  PLT 260 261   Basic Metabolic Panel: Recent Labs  Lab 07/28/20 2009 07/29/20 0415  NA 139 139  K 4.3 4.5  CL 103 103  CO2 27 27  GLUCOSE 83 165*  BUN 17 19  CREATININE 0.68 0.71  CALCIUM 8.8* 8.8*  MG  --  1.9   GFR: CrCl cannot be calculated (Unknown ideal weight.). Liver Function Tests: Recent Labs  Lab 07/29/20 0415  AST 14*  ALT 14  ALKPHOS 58  BILITOT 0.5  PROT 7.2  ALBUMIN 3.4*   No results for input(s): LIPASE, AMYLASE in the last 168 hours. No results for input(s): AMMONIA in the last 168 hours. Coagulation Profile: No results for input(s): INR, PROTIME in the last 168 hours. Cardiac Enzymes: No results for input(s): CKTOTAL, CKMB, CKMBINDEX, TROPONINI in the last 168 hours. BNP (last 3 results) No results for input(s): PROBNP in the last 8760 hours. HbA1C: No results for input(s): HGBA1C in the last 72 hours. CBG: Recent Labs  Lab 07/29/20 0216 07/29/20 0803  GLUCAP 151* 175*   Lipid Profile: No results for input(s): CHOL, HDL, LDLCALC, TRIG, CHOLHDL, LDLDIRECT in the last 72 hours. Thyroid Function Tests: Recent Labs    07/28/20 2009  TSH 3.550   Anemia Panel: No results for input(s): VITAMINB12, FOLATE, FERRITIN, TIBC, IRON, RETICCTPCT in the last 72 hours.    Radiology Studies: I have reviewed all of the imaging during this hospital visit personally     Scheduled Meds: . albuterol  2 puff Inhalation Q4H  . amLODipine  10 mg Oral Daily  . aspirin EC  81 mg Oral Daily  . DULoxetine  60 mg Oral Daily  . ezetimibe  10 mg Oral Daily  . heparin  5,000 Units Subcutaneous Q8H  . hydrochlorothiazide  12.5 mg Oral Daily  . insulin aspart  0-15  Units Subcutaneous TID WC  . insulin aspart  0-5 Units Subcutaneous QHS  . levothyroxine  50 mcg Oral QAC breakfast  . lisinopril  20 mg Oral Daily  . melatonin  9 mg Oral QHS  . meloxicam  7.5 mg Oral Daily  . methylPREDNISolone (SOLU-MEDROL) injection  80 mg Intravenous Q12H   Followed by  . [START ON 07/30/2020] predniSONE  40 mg Oral Q breakfast  . mometasone-formoterol  2 puff Inhalation BID   Continuous Infusions: . azithromycin 500 mg (07/29/20 0530)     LOS: 0 days        Jshawn Hurta Annett Gula, MD

## 2020-07-29 NOTE — H&P (Signed)
TRH H&P    Patient Demographics:    Marie West, is a 65 y.o. female  MRN: [id number]  DOB - 10-27-1955  Admit Date - 07/28/2020  Referring MD/NP/PA: [place]  Outpatient Primary MD for the patient is [facility]  Patient coming from: Home  Chief complaint- dyspnea   HPI:    Marie West  is a 65 y.o. female, with history of tobacco abuse, morbid obesity, hypothyroidism, hypertension, COPD, CHF, and more presents to the ED with a chief complaint of dyspnea. Patient reports it started 3 days ago. The first day she felt weak and fatigued and slept the whole day, she reports that she does not remember the day. The next day she was up a little bit still feeling dizzy still with general malaise. Her daughter then brought her into the hospital. She reports that she has normal smoker's cough but her cough is changed as it is normally not violent and normally has white sputum now its green sputum and she goes into violent coughing spells. Patient reports that she felt feverish but has not measured her temperature. She reports that she has tried her Symbicort and albuterol inhalers at home and they have helped but only temporarily. She thinks she has had a decrease in urine output as well, but reports that since she was sleeping all day she probably was not drinking anything either. She reports her shortness of breath is worse with exertion as is the dizziness. Both are better with rest. She uses a cane or walker to get around the home, but reports that she has felt unsteady even with her walker because of the shortness of breath. She has associated chest tightness when she coughs. Patient denies peripheral edema weight gain. She denies any palpitations. Patient has no other complaints right now.  Patient does smoke and is working on quitting. She smokes half a pack per day. She drinks alcohol once  a month. She does not use illicit drugs. She has had two vaccines for Covid. Patient is full code.  In the ED Temp 98.7, heart rate 73, respiratory rate 25, blood pressure 146/96, O2 sats as low as 88%, corrected to 93% with 2 L nasal cannula White blood cell count 5.2, hemoglobin 13.2 Chemistry panel is unremarkable Covid antigen is negative, Covid PCR pending EKG shows a heart rate of 78, QTc 471, sinus rhythm BNP 78 Patient was given Tylenol, albuterol, Decadron in the ED Chest x-ray shows mild vascular congestion.   Review of systems:    In addition to the HPI above,  Admits to subjective fever No Headache, No changes with Vision or hearing, No problems swallowing food or Liquids, Admits to chest tightness, cough, and dyspnea No Abdominal pain, No Nausea or Vomiting, bowel movements are regular, No Blood in stool or Urine, No dysuria, No new skin rashes or bruises, No new joints pains-aches,  No new weakness, tingling, numbness in any extremity, No recent weight gain or loss, No polyuria, polydypsia or polyphagia, No significant Mental Stressors.  All other systems reviewed  and are negative.    Past History of the following :    Past Medical History:  Diagnosis Date  . Acute respiratory failure ([place])   . Arthritis   . Bronchitis   . CHF (congestive heart failure) ([place])   . COPD with exacerbation ([place])   . Fibromyalgia   . Headache(784.0)   . Hypertension   . Hypertensive urgency   . Hyperthyroidism   . MRSA infection   . Pneumonia   . Retinal detachment   . Sciatic leg pain   . Shortness of breath       Past Surgical History:  Procedure Laterality Date  . KNEE SURGERY    . TUBAL LIGATION        Social History:      Social History   Tobacco Use  . Smoking status: Current Some Day Smoker    Packs/day: 0.50    Years: 36.00    Pack years: 18.00    Types: Cigarettes    Start date: 06/04/1971  . Smokeless tobacco: Never Used  Substance Use  Topics  . Alcohol use: Yes    Comment: occ-wine       Family History :     Family History  Problem Relation Age of Onset  . Diabetes Mother   . Heart disease Mother        died AMI, age 8  . Heart disease Brother   . Diabetes Sister       Home Medications:   Prior to Admission medications   Medication Sig Start Date End Date Taking? Authorizing Provider  albuterol (PROVENTIL HFA;VENTOLIN HFA) 108 (90 BASE) MCG/ACT inhaler Inhale 2 puffs into the lungs every 6 (six) hours as needed for wheezing or shortness of breath. 07/11/13  Yes Black, Lezlie Octave, NP  amLODipine (NORVASC) 10 MG tablet Take 10 mg by mouth daily. Reported on 11/17/2015   Yes [provider]  budesonide-formoterol (SYMBICORT) 160-4.5 MCG/ACT inhaler Inhale 2 puffs into the lungs 2 (two) times daily. 07/11/13  Yes Black, Lezlie Octave, NP  DULoxetine (CYMBALTA) 60 MG capsule Take 60 mg by mouth daily. Reported on 11/17/2015   Yes [provider]  ibuprofen (ADVIL) 200 MG tablet Take 800 mg by mouth every 6 (six) hours as needed.   Yes [provider]  levothyroxine (SYNTHROID, LEVOTHROID) 50 MCG tablet Take 50 mcg by mouth daily before breakfast. Reported on 11/17/2015   Yes [provider]  lisinopril-hydrochlorothiazide (PRINZIDE,ZESTORETIC) 20-12.5 MG per tablet Take 1 tablet by mouth daily. Reported on 11/17/2015   Yes [provider]  loratadine (CLARITIN) 10 MG tablet Take 10 mg by mouth daily.   Yes [provider]  Melatonin 10 MG CAPS Take 1 capsule by mouth at bedtime.   Yes [provider]  metFORMIN (GLUCOPHAGE) 500 MG tablet Take 500 mg by mouth at bedtime. Reported on 11/17/2015   Yes [provider]  ZETIA 10 MG tablet Take 1 tablet by mouth daily. 09/02/16  Yes [provider]  aspirin EC 81 MG tablet Take 81 mg by mouth daily. Reported on 11/17/2015 Patient not taking: Reported on 07/28/2020    [provider]  docusate sodium  (COLACE) 100 MG capsule Take 1 capsule (100 mg total) by mouth every 12 (twelve) hours. While on pain medication. Patient not taking: No sig reported 09/16/16   Mesner, Corene Cornea, MD  furosemide (LASIX) 20 MG tablet Take 1 tablet (20 mg total) by mouth as needed for edema (swelling). Patient  not taking: Reported on 07/28/2020 03/26/18 06/24/18  Arnoldo Lenis, MD  ipratropium-albuterol (DUONEB) 0.5-2.5 (3) MG/3ML SOLN Take 3 mLs by nebulization every 4 (four) hours as needed (wheezing). Patient not taking: Reported on 07/28/2020 09/24/15   Forde Dandy, MD  meloxicam (MOBIC) 7.5 MG tablet Take 1 tablet (7.5 mg total) by mouth daily. Patient not taking: No sig reported 02/01/17   Robinson, [place] N, PA-C  oxyCODONE-acetaminophen (PERCOCET/ROXICET) 5-325 MG tablet Take 1-2 tablets by mouth every 4 (four) hours as needed for severe pain. Patient not taking: No sig reported 09/16/16   Mesner, Corene Cornea, MD  oxyCODONE-acetaminophen (PERCOCET/ROXICET) 5-325 MG tablet Take 1-2 tablets by mouth every 8 (eight) hours as needed for severe pain. Patient not taking: No sig reported 09/16/16   Mesner, Corene Cornea, MD     Allergies:     Allergies  Allergen Reactions  . Bee Venom Swelling     Physical Exam:   Vitals  Blood pressure (!) 146/96, pulse 82, temperature 98.7 F (37.1 C), resp. rate (!) 25, SpO2 93 %.  1.  General: Patient is alert sitting on side of bed, no acute distress  2. Psychiatric: Alert and oriented x 3, mood and behavior are normal for situation, cooperative with exam  3. Neurologic: Face is symmetric, moves all 4 extremities voluntarily, speech and language normal  4. HEENMT:  Head is atraumatic normocephalic, neck is supple, trachea is midline, mucous membranes moist  5. Respiratory : Diffusely wheezy, no crackles, no rhonchi  6. Cardiovascular : Heart rate is normal, rhythm is regular, no murmurs rubs or gallops  7. Gastrointestinal:  Abdomen is obese, soft, nontender to palpation,  no masses palpated  8. Skin:  Skin is warm dry and intact, without acute lesions on limited skin exam  9.Musculoskeletal:  BL calf tenderness - chronic, no peripheral edema, no acute deformities    Data Review:    CBC Recent Labs  Lab 07/28/20 2009  WBC 5.2  HGB 13.2  HCT 42.1  PLT 260  MCV 93.3  MCH 29.3  MCHC 31.4  RDW 14.9  LYMPHSABS 1.3  MONOABS 0.7  EOSABS 0.2  BASOSABS 0.0   ------------------------------------------------------------------------------------------------------------------  Results for orders placed or performed during the hospital encounter of 07/28/20 (from the past 48 hour(s))  Basic metabolic panel     Status: Abnormal   Collection Time: 07/28/20  8:09 PM  Result Value Ref Range   Sodium 139 135 - 145 mmol/L   Potassium 4.3 3.5 - 5.1 mmol/L   Chloride 103 98 - 111 mmol/L   CO2 27 22 - 32 mmol/L   Glucose, Bld 83 70 - 99 mg/dL    Comment: Glucose reference range applies only to samples taken after fasting for at least 8 hours.   BUN 17 8 - 23 mg/dL   Creatinine, Ser 0.68 0.44 - 1.00 mg/dL   Calcium 8.8 (L) 8.9 - 10.3 mg/dL   GFR, Estimated >60 >60 mL/min    Comment: (NOTE) Calculated using the CKD-EPI Creatinine Equation (2021)    Anion gap 9 5 - 15    Comment: Performed at [facility], [address] [place], [place] [address]  CBC with Differential     Status: None   Collection Time: 07/28/20  8:09 PM  Result Value Ref Range   WBC 5.2 4.0 - 10.5 K/uL   RBC 4.51 3.87 - 5.11 MIL/uL   Hemoglobin 13.2 12.0 - 15.0 g/dL   HCT 42.1 36.0 - 46.0 %  MCV 93.3 80.0 - 100.0 fL   MCH 29.3 26.0 - 34.0 pg   MCHC 31.4 30.0 - 36.0 g/dL   RDW 14.9 11.5 - 15.5 %   Platelets 260 150 - 400 K/uL   nRBC 0.0 0.0 - 0.2 %   Neutrophils Relative % 57 %   Neutro Abs 3.0 1.7 - 7.7 K/uL   Lymphocytes Relative 25 %   Lymphs Abs 1.3 0.7 - 4.0 K/uL   Monocytes Relative 14 %   Monocytes Absolute 0.7 0.1 - 1.0 K/uL   Eosinophils Relative 4 %   Eosinophils  Absolute 0.2 0.0 - 0.5 K/uL   Basophils Relative 0 %   Basophils Absolute 0.0 0.0 - 0.1 K/uL   Immature Granulocytes 0 %   Abs Immature Granulocytes 0.02 0.00 - 0.07 K/uL    Comment: Performed at [facility], [address] [place], [place] [address]  TSH     Status: None   Collection Time: 07/28/20  8:09 PM  Result Value Ref Range   TSH 3.550 0.350 - 4.500 uIU/mL    Comment: Performed by a 3rd Generation assay with a functional sensitivity of <=0.01 uIU/mL. Performed at [facility], [address] [place], [place] [address]   Brain natriuretic peptide     Status: None   Collection Time: 07/28/20  8:12 PM  Result Value Ref Range   B Natriuretic Peptide 78.0 0.0 - 100.0 pg/mL    Comment: Performed at [facility], [address], [place], [place] [address]  POC SARS Coronavirus 2 Ag-ED - Nasal Swab (BD Veritor Kit)     Status: None   Collection Time: 07/28/20  9:19 PM  Result Value Ref Range   SARS Coronavirus 2 Ag NEGATIVE NEGATIVE    Comment: (NOTE) SARS-CoV-2 antigen NOT DETECTED.   Negative results are presumptive.  Negative results do not preclude SARS-CoV-2 infection and should not be used as the sole basis for treatment or other patient management decisions, including infection  control decisions, particularly in the presence of clinical signs and  symptoms consistent with COVID-19, or in those who have been in contact with the virus.  Negative results must be combined with clinical observations, patient history, and epidemiological information. The expected result is Negative.  Fact Sheet for Patients: HandmadeRecipes.com.cy  Fact Sheet for Healthcare Providers: FuneralLife.at  This test is not yet approved or cleared by the [place] FDA and  has been authorized for detection and/or diagnosis of SARS-CoV-2 by FDA under an Emergency Use Authorization (EUA).  This EUA will remain in effect (meaning this test can be  used) for the duration of  the COV ID-19 declaration under Section 564(b)(1) of the Act, 21 U.S.C. section 360bbb-3(b)(1), unless the authorization is terminated or revoked sooner.    Blood gas, venous (at [facility] and [facility], not at [facility])     Status: Abnormal   Collection Time: 07/29/20 12:45 AM  Result Value Ref Range   FIO2 28.00    pH, Ven 7.389 7.250 - 7.430   pCO2, Ven 48.7 44.0 - 60.0 mmHg   pO2, Ven 38.4 32.0 - 45.0 mmHg   Bicarbonate 26.8 20.0 - 28.0 mmol/L   Acid-Base Excess 4.1 (H) 0.0 - 2.0 mmol/L   O2 Saturation 69.6 %   Patient temperature 37.0    Collection site VENOUS    Drawn by 442     Comment: Performed at [facility], [address], [place], [place] [address]  CBG monitoring, ED  Status: Abnormal   Collection Time: 07/29/20  2:16 AM  Result Value Ref Range   Glucose-Capillary 151 (H) 70 - 99 mg/dL    Comment: Glucose reference range applies only to samples taken after fasting for at least 8 hours.    Chemistries  Recent Labs  Lab 07/28/20 2009  NA 139  K 4.3  CL 103  CO2 27  GLUCOSE 83  BUN 17  CREATININE 0.68  CALCIUM 8.8*   ------------------------------------------------------------------------------------------------------------------  ------------------------------------------------------------------------------------------------------------------ GFR: CrCl cannot be calculated (Unknown ideal weight.). Liver Function Tests: No results for input(s): AST, ALT, ALKPHOS, BILITOT, PROT, ALBUMIN in the last 168 hours. No results for input(s): LIPASE, AMYLASE in the last 168 hours. No results for input(s): AMMONIA in the last 168 hours. Coagulation Profile: No results for input(s): INR, PROTIME in the last 168 hours. Cardiac Enzymes: No results for input(s): CKTOTAL, CKMB, CKMBINDEX, TROPONINI in the last 168 hours. BNP (last 3 results) No results for input(s): PROBNP in the last 8760 hours. HbA1C: No results for input(s): HGBA1C in the last 72  hours. CBG: Recent Labs  Lab 07/29/20 0216  GLUCAP 151*   Lipid Profile: No results for input(s): CHOL, HDL, LDLCALC, TRIG, CHOLHDL, LDLDIRECT in the last 72 hours. Thyroid Function Tests: Recent Labs    07/28/20 2009  TSH 3.550   Anemia Panel: No results for input(s): VITAMINB12, FOLATE, FERRITIN, TIBC, IRON, RETICCTPCT in the last 72 hours.  --------------------------------------------------------------------------------------------------------------- Urine analysis:    Component Value Date/Time   COLORURINE YELLOW 06/01/2012 1919   APPEARANCEUR CLEAR 06/01/2012 1919   LABSPEC 1.025 06/01/2012 1919   PHURINE 6.5 06/01/2012 1919   GLUCOSEU NEGATIVE 06/01/2012 1919   HGBUR NEGATIVE 06/01/2012 1919   BILIRUBINUR NEGATIVE 06/01/2012 1919   KETONESUR NEGATIVE 06/01/2012 1919   PROTEINUR TRACE (A) 06/01/2012 1919   UROBILINOGEN 0.2 06/01/2012 1919   NITRITE NEGATIVE 06/01/2012 1919   LEUKOCYTESUR NEGATIVE 06/01/2012 1919      Imaging Results:    DG Chest Port 1 View  Result Date: 07/28/2020 CLINICAL DATA:  Shortness of breath EXAM: PORTABLE CHEST 1 VIEW COMPARISON:  September 16, 2016 FINDINGS: FINDINGS The heart size and mediastinal contours are within normal limits. There is prominence of the central pulmonary vasculature. The visualized skeletal structures are unremarkable. IMPRESSION: Mild pulmonary vascular congestion Electronically Signed   By: Prudencio Pair M.D.   On: 07/28/2020 20:04    My personal review of EKG: Rhythm NSR, Rate 78 /min, QTc 471 ,no Acute ST changes   Assessment & Plan:    Principal Problem:   COPD exacerbation (HCC) Active Problems:   Tobacco abuse   Morbid obesity (HCC)   Acute respiratory failure with hypoxia (HCC)   Non-insulin dependent type 2 diabetes mellitus (HCC)   Hypothyroidism   1. Acute hypoxic respiratory failure 1. O2 sats dropped as low as 85% 2. VBG shows a normal pH and a normal CO2 3. Continue O2 supplementation as  needed, wean off as tolerated 4. EKG was without acute changes 5. Will check Trop for completeness 6. BNP 78  7. Initial covid negative, PCR pending 8. CXR = mild vascular congestion  9. History is not consistent with CHF exacerbation 2. COPD exacerbation 1. Hypoxic, but not hypercapneic 2. Wheezing on exam - even after albuterol puffs in the ED 3. Continue inhalers - consider change to breathing treatments if Covid PCR comes back negative 4. Decadron started in the ED, continue solumedrol 5. Continue zithromycin 3. Hypothyroid 1. TSH pending 2. Continue synthroid  4. DMII 1. Continue sliding scale coverage 2. Carb modified diet 5. Tobacco abuse 1. Patient actively trying to quit 2. Declines Nicotine patch 3. Counseled on and encouraged continued work on cessation 6. Morbid obesity 1. Extensive counseling re obesity and how it relates to her breathing problems 2. Advised about healthy food choices and  Calorie counting 3. Continue to monitor   DVT Prophylaxis-  heparin- SCDs   AM Labs Ordered, also please review Full Orders  Family Communication: No family at bedside  Code Status:  Full  Admission status: Observation Time spent in minutes : [place] DO

## 2020-07-29 NOTE — ED Notes (Signed)
Pt moved from ED stretcher over to hospital bed without difficulty. Pt repositioned for comfort and appears to be in NAD. Call bell within reach, bed in low position. Will continue to monitor.

## 2020-07-30 ENCOUNTER — Observation Stay (HOSPITAL_COMMUNITY): Payer: Medicare HMO

## 2020-07-30 ENCOUNTER — Observation Stay (HOSPITAL_BASED_OUTPATIENT_CLINIC_OR_DEPARTMENT_OTHER): Payer: Medicare HMO

## 2020-07-30 DIAGNOSIS — Z79899 Other long term (current) drug therapy: Secondary | ICD-10-CM | POA: Diagnosis not present

## 2020-07-30 DIAGNOSIS — Z79890 Hormone replacement therapy (postmenopausal): Secondary | ICD-10-CM | POA: Diagnosis not present

## 2020-07-30 DIAGNOSIS — Z8614 Personal history of Methicillin resistant Staphylococcus aureus infection: Secondary | ICD-10-CM | POA: Diagnosis not present

## 2020-07-30 DIAGNOSIS — E038 Other specified hypothyroidism: Secondary | ICD-10-CM | POA: Diagnosis not present

## 2020-07-30 DIAGNOSIS — R0600 Dyspnea, unspecified: Secondary | ICD-10-CM

## 2020-07-30 DIAGNOSIS — I5031 Acute diastolic (congestive) heart failure: Secondary | ICD-10-CM | POA: Diagnosis present

## 2020-07-30 DIAGNOSIS — J441 Chronic obstructive pulmonary disease with (acute) exacerbation: Secondary | ICD-10-CM | POA: Diagnosis present

## 2020-07-30 DIAGNOSIS — Z7951 Long term (current) use of inhaled steroids: Secondary | ICD-10-CM | POA: Diagnosis not present

## 2020-07-30 DIAGNOSIS — Z8249 Family history of ischemic heart disease and other diseases of the circulatory system: Secondary | ICD-10-CM | POA: Diagnosis not present

## 2020-07-30 DIAGNOSIS — F17210 Nicotine dependence, cigarettes, uncomplicated: Secondary | ICD-10-CM | POA: Diagnosis present

## 2020-07-30 DIAGNOSIS — Z7982 Long term (current) use of aspirin: Secondary | ICD-10-CM | POA: Diagnosis not present

## 2020-07-30 DIAGNOSIS — Z6841 Body Mass Index (BMI) 40.0 and over, adult: Secondary | ICD-10-CM | POA: Diagnosis not present

## 2020-07-30 DIAGNOSIS — Z91030 Bee allergy status: Secondary | ICD-10-CM | POA: Diagnosis not present

## 2020-07-30 DIAGNOSIS — Z7984 Long term (current) use of oral hypoglycemic drugs: Secondary | ICD-10-CM | POA: Diagnosis not present

## 2020-07-30 DIAGNOSIS — J440 Chronic obstructive pulmonary disease with acute lower respiratory infection: Secondary | ICD-10-CM | POA: Diagnosis present

## 2020-07-30 DIAGNOSIS — R0902 Hypoxemia: Secondary | ICD-10-CM | POA: Diagnosis present

## 2020-07-30 DIAGNOSIS — J42 Unspecified chronic bronchitis: Secondary | ICD-10-CM | POA: Diagnosis not present

## 2020-07-30 DIAGNOSIS — E1165 Type 2 diabetes mellitus with hyperglycemia: Secondary | ICD-10-CM | POA: Diagnosis not present

## 2020-07-30 DIAGNOSIS — U071 COVID-19: Secondary | ICD-10-CM | POA: Diagnosis present

## 2020-07-30 DIAGNOSIS — Z791 Long term (current) use of non-steroidal anti-inflammatories (NSAID): Secondary | ICD-10-CM | POA: Diagnosis not present

## 2020-07-30 DIAGNOSIS — I110 Hypertensive heart disease with heart failure: Secondary | ICD-10-CM | POA: Diagnosis present

## 2020-07-30 DIAGNOSIS — M797 Fibromyalgia: Secondary | ICD-10-CM | POA: Diagnosis present

## 2020-07-30 DIAGNOSIS — E039 Hypothyroidism, unspecified: Secondary | ICD-10-CM | POA: Diagnosis present

## 2020-07-30 DIAGNOSIS — J9601 Acute respiratory failure with hypoxia: Secondary | ICD-10-CM | POA: Diagnosis present

## 2020-07-30 DIAGNOSIS — J1282 Pneumonia due to coronavirus disease 2019: Secondary | ICD-10-CM | POA: Diagnosis present

## 2020-07-30 DIAGNOSIS — T380X5A Adverse effect of glucocorticoids and synthetic analogues, initial encounter: Secondary | ICD-10-CM | POA: Diagnosis not present

## 2020-07-30 DIAGNOSIS — E785 Hyperlipidemia, unspecified: Secondary | ICD-10-CM | POA: Diagnosis present

## 2020-07-30 LAB — ECHOCARDIOGRAM COMPLETE
AR max vel: 1.96 cm2
AV Area VTI: 2.09 cm2
AV Area mean vel: 1.93 cm2
AV Mean grad: 7 mmHg
AV Peak grad: 14.7 mmHg
Ao pk vel: 1.92 m/s
Area-P 1/2: 2.72 cm2
Height: 60 in
S' Lateral: 3.2 cm
Weight: 5185.22 oz

## 2020-07-30 LAB — GLUCOSE, CAPILLARY
Glucose-Capillary: 131 mg/dL — ABNORMAL HIGH (ref 70–99)
Glucose-Capillary: 197 mg/dL — ABNORMAL HIGH (ref 70–99)
Glucose-Capillary: 213 mg/dL — ABNORMAL HIGH (ref 70–99)
Glucose-Capillary: 202 mg/dL — ABNORMAL HIGH (ref 70–99)

## 2020-07-30 MED ORDER — SODIUM CHLORIDE 0.9 % IV SOLN: 100.0000 mg | Freq: Every day | INTRAVENOUS | Status: DC

## 2020-07-30 MED ORDER — SODIUM CHLORIDE 0.9 % IV SOLN
100.0000 mg | Freq: Every day | INTRAVENOUS | Status: DC
  Administered 2020-07-31: 100 mg via INTRAVENOUS
  Filled 2020-07-30 (×3): qty 20

## 2020-07-30 MED ORDER — METHYLPREDNISOLONE SODIUM SUCC 125 MG IJ SOLR
50.0000 mg | Freq: Two times a day (BID) | INTRAMUSCULAR | Status: DC
  Administered 2020-07-30 – 2020-07-31 (×4): 50 mg via INTRAVENOUS
  Filled 2020-07-30 (×5): qty 2

## 2020-07-30 MED ORDER — HYDROCOD POLST-CPM POLST ER 10-8 MG/5ML PO SUER: 5.0000 mL | Freq: Two times a day (BID) | ORAL | Status: DC | PRN

## 2020-07-30 MED ORDER — SODIUM CHLORIDE 0.9 % IV SOLN
100.0000 mg | INTRAVENOUS | Status: AC
Start: 2020-07-30 — End: 2020-07-30
  Administered 2020-07-30 (×2): 100 mg via INTRAVENOUS
  Filled 2020-07-30: qty 20

## 2020-07-30 MED ORDER — SODIUM CHLORIDE 0.9 % IV SOLN: 200.0000 mg | Freq: Once | INTRAVENOUS | Status: DC

## 2020-07-30 MED ORDER — INSULIN GLARGINE 100 UNIT/ML ~~LOC~~ SOLN
10.0000 [IU] | Freq: Every day | SUBCUTANEOUS | Status: DC
  Administered 2020-07-30 – 2020-08-01 (×3): 10 [IU] via SUBCUTANEOUS
  Filled 2020-07-30 (×5): qty 0.1

## 2020-07-30 MED ORDER — IPRATROPIUM-ALBUTEROL 20-100 MCG/ACT IN AERS
1.0000 | INHALATION_SPRAY | Freq: Four times a day (QID) | RESPIRATORY_TRACT | Status: DC
  Administered 2020-07-30: 1 via RESPIRATORY_TRACT
  Filled 2020-07-30: qty 4

## 2020-07-30 MED ORDER — IPRATROPIUM-ALBUTEROL 20-100 MCG/ACT IN AERS
1.0000 | INHALATION_SPRAY | Freq: Three times a day (TID) | RESPIRATORY_TRACT | Status: DC
  Administered 2020-07-31 – 2020-08-01 (×4): 1 via RESPIRATORY_TRACT

## 2020-07-30 MED ORDER — HYDROCOD POLST-CPM POLST ER 10-8 MG/5ML PO SUER
5.0000 mL | Freq: Two times a day (BID) | ORAL | Status: DC
  Administered 2020-07-30 – 2020-08-01 (×4): 5 mL via ORAL
  Filled 2020-07-30 (×4): qty 5

## 2020-07-30 MED ORDER — GUAIFENESIN-DM 100-10 MG/5ML PO SYRP: 10.0000 mL | ORAL_SOLUTION | ORAL | Status: DC | PRN

## 2020-07-30 MED ORDER — ALBUTEROL SULFATE HFA 108 (90 BASE) MCG/ACT IN AERS
1.0000 | INHALATION_SPRAY | RESPIRATORY_TRACT | Status: DC | PRN
  Administered 2020-07-30 – 2020-07-31 (×2): 2 via RESPIRATORY_TRACT
  Administered 2020-08-01: 1 via RESPIRATORY_TRACT

## 2020-07-30 NOTE — Plan of Care (Signed)
  Problem: Acute Rehab PT Goals(only PT should resolve) Goal: Patient Will Transfer Sit To/From Stand Outcome: Progressing Flowsheets (Taken 07/30/2020 1204) Patient will transfer sit to/from stand: with modified independence Goal: Pt Will Transfer Bed To Chair/Chair To Bed Outcome: Progressing Flowsheets (Taken 07/30/2020 1204) Pt will Transfer Bed to Chair/Chair to Bed: with modified independence Goal: Pt Will Ambulate Outcome: Progressing Flowsheets (Taken 07/30/2020 1204) Pt will Ambulate:  75 feet  with modified independence  with least restrictive assistive device Goal: Pt Will Go Up/Down Stairs Outcome: Progressing Flowsheets (Taken 07/30/2020 1204) Pt will Go Up / Down Stairs:  3-5 stairs  with rail(s)  with cane   Britta Mccreedy D. Hartnett-Rands, MS, PT [facility] [phone] 07/30/2020

## 2020-07-30 NOTE — Progress Notes (Signed)
*  PRELIMINARY RESULTS* Echocardiogram 2D Echocardiogram has been performed.  Jeryl [place] 07/30/2020, 8:37 AM

## 2020-07-30 NOTE — Progress Notes (Signed)
PROGRESS NOTE    Marie West  WUG:[id number] DOB: 12/12/1955 DOA: 07/28/2020 PCP: Elmer Picker, [facility]    Brief Narrative:  [name] was admitted to the hospital with a working diagnosis of acute hypoxic respiratory failure due to CHF exacerbation.  65 year old female past medical history for hypothyroidism, hypertension, COPD, heart failure, tobacco abuse and obesity class III who presented with dyspnea.  Patient reported 3 days of productive cough, subjective fevers, and increased sleepiness.  Worsening dyspnea on exertion and dizziness.  On her initial physical examination temperature was 98.7, heart rate 73, respiratory 25, blood pressure 146/96, oxygen saturation 88% on room air.  Her lungs had diffuse wheezing, no rales or rhonchi, heart S1-S2, present rhythmic, abdomen protuberant, no lower extremity edema.  Sodium 139, potassium 4.3, chloride 103, bicarb 27, glucose 83 BUN 17, creatinine 0.68, white count 5.2, hemoglobin 13.2, hematocrit 42.1, platelets 260.  Venous blood gas pH 7.38.  SARS COVID-19 antigen negative.  Chest radiograph with cardiomegaly, positive hilar vascular congestion, predominantly on the left.  EKG 78 bpm, normal axis, normal intervals, sinus rhythm, poor R wave progression, no ST segment or T wave changes.  Patient placed on aggressive diuresis to target a negative fluid balance.   Assessment & Plan:   Principal Problem:   Acute diastolic CHF (congestive heart failure) (HCC) Active Problems:   Tobacco abuse   Morbid obesity (HCC)   Acute respiratory failure with hypoxia (HCC)   Non-insulin dependent type 2 diabetes mellitus (HCC)   Hypothyroidism   COPD (chronic obstructive pulmonary disease) (HCC)   1. Acute hypoxemic respiratory failure due SARS covid 19 viral pneumonia.   Patient had not known COVID exposure at home, no recent COVID infection. Symptoms started on 3 days prior to hospitalization.   Considering acute  symptoms, will assume she has a acute COVID infection.  Start patient with methylprednisolone and remdesivir. Follow up chest film today after diuresis.   Add bronchodilator therapy, antitussive agents and airway clearing techniques.   1. Acute hypoxemic respiratory failure due to CHF exacerbation. Urine output over last 24 hrs 1,250 ml.  Improvement of symptoms.   Continue diuresis with furosemide 40 mg IV q12 hrs. Follow up with echocardiogram.   2. COPD, tobacco abuse/ no exacerbation. On bronchodilator albuterol and scheduled dulera.   3. Hypothyroid. On levothyroxine   4. T2DM uncontrolled with hyperglycemia/ dyslipidemia. Continue insulin sliding scale for glucose cover and monitoring.  Fasting glucose is  230.  Add base insulin 10 units for now, expect hyperglycemia induced by steroids   On ezetimibe   5. Obesity class 3. Calculated BMI is 62.73  Out of bed to chair tid with meals, follow with PT and OT.   6. HTN. On amlodipine. lisinopril and HCTZ for blood pressure control.    Patient continue to be at high risk for worsening respiratory failure   Status is: Observation  The patient will require care spanning > 2 midnights and should be moved to inpatient because: IV treatments appropriate due to intensity of illness or inability to take PO  Dispo: The patient is from: Home              Anticipated d/c is to: Home              Anticipated d/c date is: 3 days              Patient currently is not medically stable to d/c.   Difficult to place patient No  DVT prophylaxis: Enoxaparin   Code Status:   full  Family Communication:  Patient communicating with her family over the phone.      Subjective: Patient continue to have dyspnea, mild improvement but no yet back to baseline, no nausea or vomiting, no cough.   Objective: Vitals:   07/29/20 2021 07/30/20 0500 07/30/20 0615 07/30/20 0819  BP: (!) 148/76  129/73   Pulse: 77  63   Resp: 18  18    Temp: 98.6 F (37 C)  98.4 F (36.9 C)   TempSrc: Oral  Oral   SpO2: 93%  91% 92%  Weight:  (!) 147 kg    Height:        Intake/Output Summary (Last 24 hours) at 07/30/2020 0916 Last data filed at 07/30/2020 0800 Gross per 24 hour  Intake -  Output 1450 ml  Net -1450 ml   Filed Weights   07/29/20 0956 07/30/20 0500  Weight: (!) 145.7 kg (!) 147 kg    Examination:   General: Not in pain, deconditioned  Neurology: Awake and alert, non focal  E ENT: mild pallor, no icterus, oral mucosa moist Cardiovascular: No JVD. S1-S2 present, rhythmic, no gallops, rubs, or murmurs. Trace lower extremity edema. Pulmonary: positive breath sounds bilaterally, with no wheezing,scattered rales, decreased breath sounds at bases. Gastrointestinal. Abdomen protuberant  Skin. No rashes Musculoskeletal: no joint deformities     Data Reviewed: I have personally reviewed following labs and imaging studies  CBC: Recent Labs  Lab 07/28/20 2009 07/29/20 0415  WBC 5.2 7.0  NEUTROABS 3.0  --   HGB 13.2 13.0  HCT 42.1 40.7  MCV 93.3 93.3  PLT 260 261   Basic Metabolic Panel: Recent Labs  Lab 07/28/20 2009 07/29/20 0415 07/29/20 1102  NA 139 139 138  K 4.3 4.5 4.3  CL 103 103 104  CO2 27 27 25   GLUCOSE 83 165* 230*  BUN 17 19 18   CREATININE 0.68 0.71 0.81  CALCIUM 8.8* 8.8* 8.9  MG  --  1.9  --    GFR: Estimated Creatinine Clearance: 94.1 mL/min (by C-G formula based on SCr of 0.81 mg/dL). Liver Function Tests: Recent Labs  Lab 07/29/20 0415  AST 14*  ALT 14  ALKPHOS 58  BILITOT 0.5  PROT 7.2  ALBUMIN 3.4*   No results for input(s): LIPASE, AMYLASE in the last 168 hours. No results for input(s): AMMONIA in the last 168 hours. Coagulation Profile: No results for input(s): INR, PROTIME in the last 168 hours. Cardiac Enzymes: No results for input(s): CKTOTAL, CKMB, CKMBINDEX, TROPONINI in the last 168 hours. BNP (last 3 results) No results for input(s): PROBNP in the  last 8760 hours. HbA1C: Recent Labs    07/29/20 0415  HGBA1C 6.6*   CBG: Recent Labs  Lab 07/29/20 0803 07/29/20 1107 07/29/20 1606 07/29/20 2236 07/30/20 0758  GLUCAP 175* 219* 183* 220* 131*   Lipid Profile: No results for input(s): CHOL, HDL, LDLCALC, TRIG, CHOLHDL, LDLDIRECT in the last 72 hours. Thyroid Function Tests: Recent Labs    07/28/20 2009  TSH 3.550   Anemia Panel: No results for input(s): VITAMINB12, FOLATE, FERRITIN, TIBC, IRON, RETICCTPCT in the last 72 hours.    Radiology Studies: I have reviewed all of the imaging during this hospital visit personally     Scheduled Meds: . amLODipine  10 mg Oral Daily  . aspirin EC  81 mg Oral Daily  . DULoxetine  60 mg Oral Daily  . ezetimibe  10  mg Oral Daily  . furosemide  40 mg Intravenous Q12H  . heparin  5,000 Units Subcutaneous Q8H  . hydrochlorothiazide  12.5 mg Oral Daily  . insulin aspart  0-15 Units Subcutaneous TID WC  . insulin aspart  0-5 Units Subcutaneous QHS  . levothyroxine  50 mcg Oral QAC breakfast  . lisinopril  10 mg Oral Daily  . mouth rinse  15 mL Mouth Rinse BID  . melatonin  9 mg Oral QHS  . meloxicam  7.5 mg Oral Daily  . mometasone-formoterol  2 puff Inhalation BID   Continuous Infusions:   LOS: 0 days        Emmely Bittinger Annett Gula, MD

## 2020-07-30 NOTE — Care Management Obs Status (Signed)
MEDICARE OBSERVATION STATUS NOTIFICATION   Patient Details  Name: Marie West MRN: [id number] Date of Birth: [birth year]   Medicare Observation Status Notification Given:  Yes (mailed to [address] [place], [place] [address])    Corey Harold 07/30/2020, 8:42 AM

## 2020-07-30 NOTE — Evaluation (Signed)
Physical Therapy Evaluation Patient Details Name: Marie West MRN: [id number] DOB: 04-15-1956 Today's Date: 07/30/2020   History of Present Illness  65 year old female past medical history for hypothyroidism, hypertension, COPD, heart failure, tobacco abuse and obesity class III who presented with dyspnea.  Patient reported 3 days of productive cough, subjective fevers, and increased sleepiness.  Worsening dyspnea on exertion and dizziness.  On her initial physical examination temperature was 98.7, heart rate 73, respiratory 25, blood pressure 146/96, oxygen saturation 88% on room air.  Her lungs had diffuse wheezing, no rales or rhonchi, heart S1-S2, present rhythmic, abdomen protuberant, no lower extremity edema.    Clinical Impression  Pt admitted with above diagnosis. Patient on commode when PT arrived. Patient functioning near baseline although patient typically ambulates with SPC in her home environment and at work. Patient limited in ambulated with RW by fatigue on 1 LPM O2. Pt currently with functional limitations due to the deficits listed below (see PT Problem List). Pt will benefit from skilled PT to increase their independence and safety with mobility to allow discharge to the venue listed below.       Follow Up Recommendations No PT follow up    Equipment Recommendations  None recommended by PT    Recommendations for Other Services       Precautions / Restrictions Precautions Precautions: Fall Restrictions Weight Bearing Restrictions: No      Mobility  Bed Mobility Overal bed mobility: Modified Independent   Transfers Overall transfer level: Needs assistance Equipment used: Rolling walker (2 wheeled) Transfers: Sit to/from Stand Sit to Stand: Supervision     Ambulation/Gait Ambulation/Gait assistance: Supervision Gait Distance (Feet): 40 Feet Assistive device: Rolling walker (2 wheeled) Gait Pattern/deviations: Step-through pattern;Decreased step length -  right;Decreased step length - left;Decreased stride length;Wide base of support Gait velocity: decreased   General Gait Details: slow, somewhat labored gait with RW; wide base of support; shortness of breath; dyspnea on exertion; 1 LPM O2; lateral trunk sway bilaterally with gait; limited by fatigue  Stairs  Wheelchair Mobility    Modified Rankin (Stroke Patients Only)       Balance Overall balance assessment: Needs assistance Sitting-balance support: No upper extremity supported;Feet supported Sitting balance-Leahy Scale: Good     Standing balance support: Bilateral upper extremity supported;During functional activity Standing balance-Leahy Scale: Fair Standing balance comment: fair/good w/ RW      Pertinent Vitals/Pain Pain Assessment: 0-10 Pain Score: 6  Pain Location: lower back and left shoulder Pain Descriptors / Indicators: Dull;Aching;Spasm Pain Intervention(s): Limited activity within patient's tolerance;Monitored during session;Repositioned    Home Living Family/patient expects to be discharged to:: Private residence Living Arrangements: Spouse/significant other;Children Available Help at Discharge: Family;Available 24 hours/day Type of Home: Mobile home Home Access: Stairs to enter Entrance Stairs-Rails: Right;Left;Can reach both   Home Layout: One level Home Equipment: Emergency planning/management officer - 4 wheels;Cane - single point;Hand held shower head      Prior Function Level of Independence: Needs assistance   Gait / Transfers Assistance Needed: usually ambulates with SPC; sometimes RW depending on pain  ADL's / Homemaking Assistance Needed: sometimes needs assistance with dressing; cooks and does dishes; boyfriend does cleaning and laundry  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     Hand Dominance   Dominant Hand: Right    Extremity/Trunk Assessment   Upper Extremity Assessment Upper Extremity  Assessment: Defer to OT evaluation    Lower Extremity Assessment Lower  Extremity Assessment: Generalized weakness    Cervical / Trunk Assessment Cervical / Trunk Assessment: Normal  Communication   Communication: No difficulties  Cognition Arousal/Alertness: Awake/alert Behavior During Therapy: WFL for tasks assessed/performed Overall Cognitive Status: Within Functional Limits for tasks assessed      General Comments      Exercises     Assessment/Plan    PT Assessment Patient needs continued PT services  PT Problem List Decreased strength;Decreased activity tolerance;Decreased balance;Decreased mobility       PT Treatment Interventions Balance training;Gait training;Patient/family education;Therapeutic activities;Therapeutic exercise    PT Goals (Current goals can be found in the Care Plan section)  Acute Rehab PT Goals Patient Stated Goal: Go home. PT Goal Formulation: With patient Time For Goal Achievement: 08/13/20 Potential to Achieve Goals: Fair    Frequency Min 2X/week   Barriers to discharge           AM-PAC PT "6 Clicks" Mobility  Outcome Measure Help needed turning from your back to your side while in a flat bed without using bedrails?: None Help needed moving from lying on your back to sitting on the side of a flat bed without using bedrails?: None Help needed moving to and from a bed to a chair (including a wheelchair)?: A Little Help needed standing up from a chair using your arms (e.g., wheelchair or bedside chair)?: A Little Help needed to walk in hospital room?: A Little Help needed climbing 3-5 steps with a railing? : A Lot 6 Click Score: 19    End of Session   Activity Tolerance: Patient tolerated treatment well;Patient limited by fatigue Patient left: in chair;with nursing/sitter in room;with call bell/phone within reach Nurse Communication: Mobility status PT Visit Diagnosis: Unsteadiness on feet (R26.81);Muscle weakness (generalized)  (M62.81);Other abnormalities of gait and mobility (R26.89)    Time: 0626-9485 PT Time Calculation (min) (ACUTE ONLY): 23 min   Charges:   PT Evaluation $PT Eval Low Complexity: 1 Low PT Treatments $Therapeutic Activity: 8-22 mins        Katina Dung. Hartnett-Rands, MS, PT [facility] [place] [phone] 07/30/2020, 10:57 AM

## 2020-07-31 LAB — COMPREHENSIVE METABOLIC PANEL
ALT: 24 U/L (ref 0–44)
AST: 22 U/L (ref 15–41)
Albumin: 3.5 g/dL (ref 3.5–5.0)
Alkaline Phosphatase: 55 U/L (ref 38–126)
Anion gap: 14 (ref 5–15)
BUN: 40 mg/dL — ABNORMAL HIGH (ref 8–23)
CO2: 26 mmol/L (ref 22–32)
Calcium: 9.1 mg/dL (ref 8.9–10.3)
Chloride: 98 mmol/L (ref 98–111)
Creatinine, Ser: 0.95 mg/dL (ref 0.44–1.00)
GFR, Estimated: >60 mL/min (ref >60)
Glucose, Bld: 178 mg/dL — ABNORMAL HIGH (ref 70–99)
Potassium: 3.9 mmol/L (ref 3.5–5.1)
Sodium: 138 mmol/L (ref 135–145)
Total Bilirubin: 0.5 mg/dL (ref 0.3–1.2)
Total Protein: 7.2 g/dL (ref 6.5–8.1)

## 2020-07-31 LAB — GLUCOSE, CAPILLARY
Glucose-Capillary: 196 mg/dL — ABNORMAL HIGH (ref 70–99)
Glucose-Capillary: 167 mg/dL — ABNORMAL HIGH (ref 70–99)
Glucose-Capillary: 212 mg/dL — ABNORMAL HIGH (ref 70–99)
Glucose-Capillary: 155 mg/dL — ABNORMAL HIGH (ref 70–99)
Glucose-Capillary: 217 mg/dL — ABNORMAL HIGH (ref 70–99)

## 2020-07-31 LAB — C-REACTIVE PROTEIN: CRP: 2.3 mg/dL — ABNORMAL HIGH (ref <1.0)

## 2020-07-31 LAB — FERRITIN: Ferritin: 134 ng/mL (ref 11–307)

## 2020-07-31 LAB — D-DIMER, QUANTITATIVE: D-Dimer, Quant: 1.19 ug/mL-FEU — ABNORMAL HIGH (ref 0.00–0.50)

## 2020-07-31 MED ORDER — METOLAZONE 5 MG PO TABS
5.0000 mg | ORAL_TABLET | Freq: Once | ORAL | Status: AC
  Administered 2020-07-31: 5 mg via ORAL
  Filled 2020-07-31: qty 1

## 2020-07-31 NOTE — Evaluation (Signed)
Occupational Therapy Evaluation Patient Details Name: Marie West MRN: [id number] DOB: Oct 13, 1955 Today's Date: 07/31/2020    History of Present Illness 65 year old female past medical history for hypothyroidism, hypertension, COPD, heart failure, tobacco abuse and obesity class III who presented with dyspnea.  Patient reported 3 days of productive cough, subjective fevers, and increased sleepiness.  Worsening dyspnea on exertion and dizziness.  On her initial physical examination temperature was 98.7, heart rate 73, respiratory 25, blood pressure 146/96, oxygen saturation 88% on room air.  Her lungs had diffuse wheezing, no rales or rhonchi, heart S1-S2, present rhythmic, abdomen protuberant, no lower extremity edema.   Clinical Impression   Pt agreeable to evaluation. Pt demonstrated sit to stand, functional ambulation to sink, and static standing at the sink with MOD I to SPV. Pt limited by shortness of breath. Prior to movement pt reported that staff had informed her to try being without typical 2.5 L of O2 unless needed. Once at the sink pt was experiencing shortness of breath and recorded SpO2 level of 82. Pt was directed back to bed and placed on 2.5 L of OT when pt reported that she think she was supposed to use nasal cannula when out of bed. Pt was placed back on 2.5 L O2 using nasal canula. After a few minutes pt SPo2 level was recorded at 94. Other than shortness of breath pt appeared to be at baseline level for ADLs and UE function. Pt has help available 24/7 at home and was receiving assist for LE dressing at baseline. No further hospital OT recommended. PT recommended to d/c home without further OT.     Follow Up Recommendations  No OT follow up    Equipment Recommendations  None recommended by OT           Precautions / Restrictions Precautions Precautions: Fall Restrictions Weight Bearing Restrictions: No      Mobility Bed Mobility                     Transfers Overall transfer level: Needs assistance Equipment used: Rolling walker (2 wheeled) Transfers: Sit to/from Stand Sit to Stand: Modified independent (Device/Increase time);Supervision                                                         ADL either performed or assessed with clinical judgement   ADL Overall ADL's : Needs assistance/impaired     Grooming: Modified independent;Supervision/safety Grooming Details (indicate cue type and reason): Pt was able to ambulate to sink and complete hand washing with SPV but experienced shortness of breath during and after washing hands. Pt directed back to EOB.                 Toilet Transfer: Supervision/safety;Modified Chartered certified accountant Details (indicate cue type and reason): Simulated via functional ambulation to sink and back to EOB. MOD I/ SPV. Shortness of breath primary limiting factor.           General ADL Comments: Overall pt seems to be at baseline other than more pronounced shortness of breath.     Vision Baseline Vision/History: No visual deficits                  Pertinent Vitals/Pain Pain Assessment: 0-10 Pain Score: 10-Worst pain ever Pain Location: low  back pain into bilateral lower extremities. Pain Descriptors / Indicators: Dull;Aching;Spasm (Taken from doc review.) Pain Intervention(s): Monitored during session     Hand Dominance Right   Extremity/Trunk Assessment Upper Extremity Assessment Upper Extremity Assessment: Overall WFL for tasks assessed;Generalized weakness   Lower Extremity Assessment Lower Extremity Assessment: Defer to PT evaluation   Cervical / Trunk Assessment Cervical / Trunk Assessment: Normal   Communication Communication Communication: No difficulties   Cognition Arousal/Alertness: Awake/alert Behavior During Therapy: WFL for tasks assessed/performed Overall Cognitive Status: Within Functional Limits for tasks  assessed                                                      Home Living Family/patient expects to be discharged to:: Private residence Living Arrangements: Spouse/significant other;Children Available Help at Discharge: Family;Available 24 hours/day Type of Home: Mobile home Home Access: Stairs to enter Entrance Stairs-Number of Steps: 4 Entrance Stairs-Rails: Right;Left (Reaches for one and uses cain with other hand.) Home Layout: One level     Bathroom Shower/Tub: Chief Strategy Officer: Standard Bathroom Accessibility: Yes   Home Equipment: Emergency planning/management officer - 4 wheels;Cane - single point;Hand held shower head          Prior Functioning/Environment Level of Independence: Needs assistance  Gait / Transfers Assistance Needed: uses Rollator in home per report. 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                      OT Goals(Current goals can be found in the care plan section) Acute Rehab OT Goals Patient Stated Goal: Go home.  OT Frequency:      End of Session Equipment Utilized During Treatment: Rolling walker  Activity Tolerance: Patient limited by fatigue (shortness of breath) Patient left: in bed;with call bell/phone within reach (EOB)  OT Visit Diagnosis: Dizziness and giddiness (R42);Other (comment) (Dyspnea)                Time: 3545-6256 OT Time Calculation (min): 15 min Charges:  OT General Charges $OT Visit: 1 Visit OT Evaluation $OT Eval Low Complexity: 1 Low  Kariya Lavergne OT, MOT   Danie Chandler 07/31/2020, 9:28 AM

## 2020-07-31 NOTE — Progress Notes (Signed)
PROGRESS NOTE    LINDELL West  HGD:[id number] DOB: 08/09/1955 DOA: 07/28/2020 PCP: Elmer Picker, [facility]    Brief Narrative:  [name] was admitted to the hospital with a working diagnosis of acute hypoxic respiratory failure due to CHF exacerbation.  65 year old female past medical history for hypothyroidism, hypertension, COPD, heart failure, tobacco abuse and obesity class III who presented with dyspnea.  Patient reported 3 days of productive cough, subjective fevers, and increased sleepiness.  Worsening dyspnea on exertion and dizziness.  On her initial physical examination temperature was 98.7, heart rate 73, respiratory 25, blood pressure 146/96, oxygen saturation 88% on room air.  Her lungs had diffuse wheezing, no rales or rhonchi, heart S1-S2, present rhythmic, abdomen protuberant, no lower extremity edema.  Sodium 139, potassium 4.3, chloride 103, bicarb 27, glucose 83 BUN 17, creatinine 0.68, white count 5.2, hemoglobin 13.2, hematocrit 42.1, platelets 260.  Venous blood gas pH 7.38.  SARS COVID-19 antigen negative.  Chest radiograph with cardiomegaly, positive hilar vascular congestion, predominantly on the left.  EKG 78 bpm, normal axis, normal intervals, sinus rhythm, poor R wave progression, no ST segment or T wave changes.  Patient placed on aggressive diuresis to target a negative fluid balance.   Assessment & Plan:   Principal Problem:   Acute diastolic CHF (congestive heart failure) (HCC) Active Problems:   Tobacco abuse   Morbid obesity (HCC)   Acute respiratory failure with hypoxia (HCC)   Non-insulin dependent type 2 diabetes mellitus (HCC)   Hypothyroidism   COPD (chronic obstructive pulmonary disease) (HCC)   Pneumonia due to COVID-19 virus   1. Acute hypoxemic respiratory failure due CHF exacerbation as well as SARS covid 19 viral infection -Continue Solu-Medrol, remdesivir, bronchodilators mucolytics and supplemental  oxygen  2)HFpEF--- echo from 07/30/2020 with preserved EF of 60 to 65%, -Clinical exam and chest x-ray suggestive of CHF exacerbation, continue IV Lasix, daily weight and fluid input and output monitoring   3)COPD/Tobacco Abuse---  Suspect some degree of COPD exacerbation, continue IV steroids, bronchodilators and mucolytics as above #1 -Nicotine patch is ordered  4. T2DM uncontrolled with hyperglycemia/ dyslipidemia. -Anticipate worsening glycemic control with steroids -Added Lantus 10 units daily  Continue insulin sliding scale for glucose cover and monitoring.  Use Novolog/Humalog Sliding scale insulin with Accu-Cheks/Fingersticks as ordered -c/n Zetia   5. Morbid Obesity- -Low calorie diet, portion control and increase physical activity discussed with patient -Body mass index is 62.11 kg/m. Out of bed to chair tid with meals, follow with PT and OT.   6. HTN. On amlodipine. lisinopril , Stop HCTZ  As she is on Lasix    7) Hypothyroid. On levothyroxine    The patient will require care spanning > 2 midnights and should be moved to inpatient because: Acute hypoxic respiratory failure requiring IV diuresis, IV steroids with frequent monitoring of renal electrolyte parameters  Dispo: The patient is from: Home              Anticipated d/c is to: Home              Anticipated d/c date is: 3 days              Patient currently is not medically stable to d/c.   Difficult to place patient No   DVT prophylaxis: Enoxaparin   Code Status:   full  Family Communication:  Patient communicating with her family over the phone.     Subjective:  dyspnea on exertion and hypoxic resp  failure No fever  Or chills  No chest pain  Objective: Vitals:   07/31/20 0824 07/31/20 0900 07/31/20 0933 07/31/20 1349  BP:    138/88  Pulse:    70  Resp:      Temp:    98 F (36.7 C)  TempSrc:    Oral  SpO2: 93% (!) 82% 92% 91%  Weight:      Height:        Intake/Output Summary (Last 24  hours) at 07/31/2020 1758 Last data filed at 07/31/2020 1300 Gross per 24 hour  Intake 750 ml  Output 300 ml  Net 450 ml   Filed Weights   07/29/20 0956 07/30/20 0500 07/31/20 0815  Weight: (!) 145.7 kg (!) 147 kg (!) 144.2 kg    Examination:   General: Morbidly obese, in no acute distress Neurology: Awake and alert, non focal  E ENT: mild pallor, no icterus, oral mucosa moist Nose- Dwight 2L/min Cardiovascular: No JVD. S1-S2 present, rhythmic, + lower extremity edema. Pulmonary: Diminished breath sounds, no wheezing, few bibasilar rales Gastrointestinal. Abdomen protuberant/increased truncal adiposity, soft, NT,  Skin. No rashes Musculoskeletal: no joint deformities   Data Reviewed: I have personally reviewed following labs and imaging studies  CBC: Recent Labs  Lab 07/28/20 2009 07/29/20 0415  WBC 5.2 7.0  NEUTROABS 3.0  --   HGB 13.2 13.0  HCT 42.1 40.7  MCV 93.3 93.3  PLT 260 261   Basic Metabolic Panel: Recent Labs  Lab 07/28/20 2009 07/29/20 0415 07/29/20 1102 07/31/20 0448  NA 139 139 138 138  K 4.3 4.5 4.3 3.9  CL 103 103 104 98  CO2 27 27 25 26   GLUCOSE 83 165* 230* 178*  BUN 17 19 18  40*  CREATININE 0.68 0.71 0.81 0.95  CALCIUM 8.8* 8.8* 8.9 9.1  MG  --  1.9  --   --    GFR: Estimated Creatinine Clearance: 79.2 mL/min (by C-G formula based on SCr of 0.95 mg/dL). Liver Function Tests: Recent Labs  Lab 07/29/20 0415 07/31/20 0448  AST 14* 22  ALT 14 24  ALKPHOS 58 55  BILITOT 0.5 0.5  PROT 7.2 7.2  ALBUMIN 3.4* 3.5   No results for input(s): LIPASE, AMYLASE in the last 168 hours. No results for input(s): AMMONIA in the last 168 hours. Coagulation Profile: No results for input(s): INR, PROTIME in the last 168 hours. Cardiac Enzymes: No results for input(s): CKTOTAL, CKMB, CKMBINDEX, TROPONINI in the last 168 hours. BNP (last 3 results) No results for input(s): PROBNP in the last 8760 hours. HbA1C: Recent Labs    07/29/20 0415  HGBA1C  6.6*   CBG: Recent Labs  Lab 07/30/20 2110 07/30/20 2206 07/31/20 0823 07/31/20 1058 07/31/20 1714  GLUCAP 202* 196* 167* 212* 155*   Lipid Profile: No results for input(s): CHOL, HDL, LDLCALC, TRIG, CHOLHDL, LDLDIRECT in the last 72 hours. Thyroid Function Tests: Recent Labs    07/28/20 2009  TSH 3.550   Anemia Panel: Recent Labs    07/31/20 0448  FERRITIN 134      Radiology Studies: I have reviewed all of the imaging during this hospital visit personally  Scheduled Meds: . amLODipine  10 mg Oral Daily  . aspirin EC  81 mg Oral Daily  . chlorpheniramine-HYDROcodone  5 mL Oral Q12H  . DULoxetine  60 mg Oral Daily  . ezetimibe  10 mg Oral Daily  . furosemide  40 mg Intravenous Q12H  . heparin  5,000 Units Subcutaneous  Q8H  . insulin aspart  0-15 Units Subcutaneous TID WC  . insulin aspart  0-5 Units Subcutaneous QHS  . insulin glargine  10 Units Subcutaneous Daily  . Ipratropium-Albuterol  1 puff Inhalation TID  . levothyroxine  50 mcg Oral QAC breakfast  . lisinopril  10 mg Oral Daily  . mouth rinse  15 mL Mouth Rinse BID  . melatonin  9 mg Oral QHS  . meloxicam  7.5 mg Oral Daily  . methylPREDNISolone (SOLU-MEDROL) injection  50 mg Intravenous Q12H  . metolazone  5 mg Oral Once  . mometasone-formoterol  2 puff Inhalation BID   Continuous Infusions: . remdesivir 100 mg in NS 100 mL 100 mg (07/31/20 0935)     LOS: 1 day     Shon Hale, MD

## 2020-07-31 NOTE — Progress Notes (Signed)
Physical Therapy Treatment Patient Details Name: Marie West MRN: [id number] DOB: 04-13-1956 Today's Date: 07/31/2020    History of Present Illness 65 year old female past medical history for hypothyroidism, hypertension, COPD, heart failure, tobacco abuse and obesity class III who presented with dyspnea.  Patient reported 3 days of productive cough, subjective fevers, and increased sleepiness.  Worsening dyspnea on exertion and dizziness.  On her initial physical examination temperature was 98.7, heart rate 73, respiratory 25, blood pressure 146/96, oxygen saturation 88% on room air.  Her lungs had diffuse wheezing, no rales or rhonchi, heart S1-S2, present rhythmic, abdomen protuberant, no lower extremity edema.    PT Comments    Patient demonstrates good return for sitting up at bedside and completing BLE ROM/strengthening exercises, able to ambulate in room without loss of balance, occasional leaning over RW due to increasing low back pain and tolerated sitting up in chair after therapy.  Patient will benefit from continued physical therapy in hospital and recommended venue below to increase strength, balance, endurance for safe ADLs and gait.    Follow Up Recommendations  No PT follow up     Equipment Recommendations   NONE   Recommendations for Other Services       Precautions / Restrictions Precautions Precautions: Fall Restrictions Weight Bearing Restrictions: No    Mobility  Bed Mobility Overal bed mobility: Modified Independent                  Transfers Overall transfer level: Needs assistance Equipment used: Rolling walker (2 wheeled) Transfers: Sit to/from Stand Sit to Stand: Modified independent (Device/Increase time);Supervision         General transfer comment: slightly labored movement  Ambulation/Gait Ambulation/Gait assistance: Supervision Gait Distance (Feet): 40 Feet Assistive device: Rolling walker (2 wheeled) Gait Pattern/deviations:  Step-through pattern;Decreased step length - right;Decreased step length - left;Decreased stride length;Wide base of support;Trunk flexed Gait velocity: decreased   General Gait Details: slightly labord cadence with occasional leaning over RW due to increasing low back pain, no loss of balance and limited mostly due to c/o fatigue   Stairs             Wheelchair Mobility    Modified Rankin (Stroke Patients Only)       Balance Overall balance assessment: Needs assistance Sitting-balance support: Feet supported;No upper extremity supported Sitting balance-Leahy Scale: Good Sitting balance - Comments: seated at EOB   Standing balance support: During functional activity;Bilateral upper extremity supported Standing balance-Leahy Scale: Fair Standing balance comment: fair/good w/ RW                            Cognition Arousal/Alertness: Awake/alert Behavior During Therapy: WFL for tasks assessed/performed Overall Cognitive Status: Within Functional Limits for tasks assessed                                        Exercises General Exercises - Lower Extremity Long Arc Quad: Seated;AROM;Strengthening;10 reps;Both Hip Flexion/Marching: Seated;AROM;Strengthening;10 reps;Both Toe Raises: Seated;AROM;Strengthening;10 reps;Both Heel Raises: Seated;AROM;Strengthening;10 reps;Both    General Comments        Pertinent Vitals/Pain Pain Assessment: Faces Pain Score: 10-Worst pain ever Faces Pain Scale: Hurts little more Pain Location: low back Pain Descriptors / Indicators: Sore;Grimacing;Guarding Pain Intervention(s): Limited activity within patient's tolerance;Monitored during session;Repositioned    Home Living Family/patient expects to be discharged to:: Private residence  Living Arrangements: Spouse/significant other;Children Available Help at Discharge: Family;Available 24 hours/day Type of Home: Mobile home Home Access: Stairs to  enter Entrance Stairs-Rails: Right;Left (Reaches for one and uses cain with other hand.) Home Layout: One level Home Equipment: Emergency planning/management officer - 4 wheels;Cane - single point;Hand held shower head      Prior Function Level of Independence: Needs assistance  Gait / Transfers Assistance Needed: uses Rollator in home per report. 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   PT Goals (current goals can now be found in the care plan section) Acute Rehab PT Goals Patient Stated Goal: Go home. PT Goal Formulation: With patient Time For Goal Achievement: 08/13/20 Potential to Achieve Goals: Good Progress towards PT goals: Progressing toward goals    Frequency    Min 2X/week      PT Plan Current plan remains appropriate    Co-evaluation              AM-PAC PT "6 Clicks" Mobility   Outcome Measure  Help needed turning from your back to your side while in a flat bed without using bedrails?: None Help needed moving from lying on your back to sitting on the side of a flat bed without using bedrails?: None Help needed moving to and from a bed to a chair (including a wheelchair)?: None Help needed standing up from a chair using your arms (e.g., wheelchair or bedside chair)?: None Help needed to walk in hospital room?: A Little Help needed climbing 3-5 steps with a railing? : A Lot 6 Click Score: 21    End of Session   Activity Tolerance: Patient tolerated treatment well;Patient limited by fatigue Patient left: in chair;with call bell/phone within reach Nurse Communication: Mobility status PT Visit Diagnosis: Unsteadiness on feet (R26.81);Muscle weakness (generalized) (M62.81);Other abnormalities of gait and mobility (R26.89)     Time: 1020-1045 PT Time Calculation (min) (ACUTE  ONLY): 25 min  Charges:  $Gait Training: 8-22 mins $Therapeutic Exercise: 8-22 mins                     12:28 PM, 07/31/20 Ocie Bob, MPT Physical Therapist with [facility] [phone] office [phone] mobile phone

## 2020-07-31 NOTE — Plan of Care (Signed)

## 2020-07-31 NOTE — Care Management Important Message (Signed)
Important Message  Patient Details  Name: Marie West MRN: [id number] Date of Birth: [birth year]   Medicare Important Message Given:  Yes - Important Message mailed due to current National Emergency     Corey Harold 07/31/2020, 2:02 PM

## 2020-07-31 NOTE — Progress Notes (Signed)
SATURATION QUALIFICATIONS: (This note is used to comply with regulatory documentation for home oxygen)  Patient Saturations on Room Air at Rest = 93%  Patient Saturations on Room Air while Ambulating = 88%  Patient Saturations on 2.5 Liters of oxygen while Ambulating = 92%  Please briefly explain why patient needs home oxygen:  Pt oxygen saturation levels drop while ambulating

## 2020-08-01 DIAGNOSIS — U071 COVID-19: Principal | ICD-10-CM

## 2020-08-01 DIAGNOSIS — J1282 Pneumonia due to coronavirus disease 2019: Secondary | ICD-10-CM

## 2020-08-01 LAB — COMPREHENSIVE METABOLIC PANEL
ALT: 22 U/L (ref 0–44)
AST: 16 U/L (ref 15–41)
Albumin: 3.5 g/dL (ref 3.5–5.0)
Alkaline Phosphatase: 51 U/L (ref 38–126)
Anion gap: 13 (ref 5–15)
BUN: 49 mg/dL — ABNORMAL HIGH (ref 8–23)
CO2: 25 mmol/L (ref 22–32)
Calcium: 8.6 mg/dL — ABNORMAL LOW (ref 8.9–10.3)
Chloride: 97 mmol/L — ABNORMAL LOW (ref 98–111)
Creatinine, Ser: 0.91 mg/dL (ref 0.44–1.00)
GFR, Estimated: >60 mL/min (ref >60)
Glucose, Bld: 196 mg/dL — ABNORMAL HIGH (ref 70–99)
Potassium: 3.7 mmol/L (ref 3.5–5.1)
Sodium: 135 mmol/L (ref 135–145)
Total Bilirubin: 0.6 mg/dL (ref 0.3–1.2)
Total Protein: 7.1 g/dL (ref 6.5–8.1)

## 2020-08-01 LAB — GLUCOSE, CAPILLARY
Glucose-Capillary: 190 mg/dL — ABNORMAL HIGH (ref 70–99)
Glucose-Capillary: 157 mg/dL — ABNORMAL HIGH (ref 70–99)

## 2020-08-01 LAB — D-DIMER, QUANTITATIVE: D-Dimer, Quant: 1.35 ug/mL-FEU — ABNORMAL HIGH (ref 0.00–0.50)

## 2020-08-01 LAB — FERRITIN: Ferritin: 136 ng/mL (ref 11–307)

## 2020-08-01 LAB — C-REACTIVE PROTEIN: CRP: 1.3 mg/dL — ABNORMAL HIGH (ref <1.0)

## 2020-08-01 MED ORDER — ACETAMINOPHEN 325 MG PO TABS: 650.0000 mg | ORAL_TABLET | Freq: Four times a day (QID) | ORAL | 0 refills | Status: AC | PRN

## 2020-08-01 MED ORDER — ALBUTEROL SULFATE HFA 108 (90 BASE) MCG/ACT IN AERS: 2.0000 | INHALATION_SPRAY | Freq: Four times a day (QID) | RESPIRATORY_TRACT | 2 refills | Status: AC | PRN

## 2020-08-01 MED ORDER — AMLODIPINE BESYLATE 5 MG PO TABS: 5.0000 mg | ORAL_TABLET | Freq: Every day | ORAL | 2 refills | Status: DC

## 2020-08-01 MED ORDER — ASPIRIN EC 81 MG PO TBEC: 81.0000 mg | DELAYED_RELEASE_TABLET | Freq: Every day | ORAL | 11 refills | Status: AC

## 2020-08-01 MED ORDER — POTASSIUM CHLORIDE ER 10 MEQ PO TBCR: 10.0000 meq | EXTENDED_RELEASE_TABLET | Freq: Every day | ORAL | 1 refills | Status: DC

## 2020-08-01 MED ORDER — GUAIFENESIN-DM 100-10 MG/5ML PO SYRP: 10.0000 mL | ORAL_SOLUTION | ORAL | 0 refills | Status: DC | PRN

## 2020-08-01 MED ORDER — BUDESONIDE-FORMOTEROL FUMARATE 160-4.5 MCG/ACT IN AERO: 2.0000 | INHALATION_SPRAY | Freq: Two times a day (BID) | RESPIRATORY_TRACT | 12 refills | Status: AC

## 2020-08-01 MED ORDER — FUROSEMIDE 40 MG PO TABS: 40.0000 mg | ORAL_TABLET | Freq: Two times a day (BID) | ORAL | 2 refills | Status: DC

## 2020-08-01 MED ORDER — METFORMIN HCL 500 MG PO TABS: 500.0000 mg | ORAL_TABLET | Freq: Every day | ORAL | 5 refills | Status: AC

## 2020-08-01 MED ORDER — PREDNISONE 50 MG PO TABS
50.0000 mg | ORAL_TABLET | Freq: Every day | ORAL | 0 refills | Status: DC
Start: 2020-08-02 — End: 2021-04-07

## 2020-08-01 NOTE — Plan of Care (Signed)

## 2020-08-01 NOTE — Plan of Care (Signed)
  Problem: Education: Goal: Knowledge of General Education information will improve Description: Including pain rating scale, medication(s)/side effects and non-pharmacologic comfort measures 08/01/2020 1044 by Zachery Dakins, RN Outcome: Adequate for Discharge 08/01/2020 0800 by Zachery Dakins, RN Outcome: Progressing   Problem: Health Behavior/Discharge Planning: Goal: Ability to manage health-related needs will improve 08/01/2020 1044 by Zachery Dakins, RN Outcome: Adequate for Discharge 08/01/2020 0800 by Zachery Dakins, RN Outcome: Progressing   Problem: Clinical Measurements: Goal: Ability to maintain clinical measurements within normal limits will improve 08/01/2020 1044 by Zachery Dakins, RN Outcome: Adequate for Discharge 08/01/2020 0800 by Zachery Dakins, RN Outcome: Progressing Goal: Will remain free from infection 08/01/2020 1044 by Zachery Dakins, RN Outcome: Adequate for Discharge 08/01/2020 0800 by Zachery Dakins, RN Outcome: Progressing Goal: Diagnostic test results will improve 08/01/2020 1044 by Zachery Dakins, RN Outcome: Adequate for Discharge 08/01/2020 0800 by Zachery Dakins, RN Outcome: Progressing Goal: Respiratory complications will improve 08/01/2020 1044 by Zachery Dakins, RN Outcome: Adequate for Discharge 08/01/2020 0800 by Zachery Dakins, RN Outcome: Progressing Goal: Cardiovascular complication will be avoided 08/01/2020 1044 by Zachery Dakins, RN Outcome: Adequate for Discharge 08/01/2020 0800 by Zachery Dakins, RN Outcome: Progressing   Problem: Activity: Goal: Risk for activity intolerance will decrease 08/01/2020 1044 by Zachery Dakins, RN Outcome: Adequate for Discharge 08/01/2020 0800 by Zachery Dakins, RN Outcome: Progressing   Problem: Nutrition: Goal: Adequate nutrition will be maintained 08/01/2020 1044 by Zachery Dakins, RN Outcome:  Adequate for Discharge 08/01/2020 0800 by Zachery Dakins, RN Outcome: Progressing   Problem: Coping: Goal: Level of anxiety will decrease 08/01/2020 1044 by Zachery Dakins, RN Outcome: Adequate for Discharge 08/01/2020 0800 by Zachery Dakins, RN Outcome: Progressing   Problem: Elimination: Goal: Will not experience complications related to bowel motility 08/01/2020 1044 by Zachery Dakins, RN Outcome: Adequate for Discharge 08/01/2020 0800 by Zachery Dakins, RN Outcome: Progressing Goal: Will not experience complications related to urinary retention 08/01/2020 1044 by Zachery Dakins, RN Outcome: Adequate for Discharge 08/01/2020 0800 by Zachery Dakins, RN Outcome: Progressing   Problem: Pain Managment: Goal: General experience of comfort will improve 08/01/2020 1044 by Zachery Dakins, RN Outcome: Adequate for Discharge 08/01/2020 0800 by Zachery Dakins, RN Outcome: Progressing   Problem: Safety: Goal: Ability to remain free from injury will improve 08/01/2020 1044 by Zachery Dakins, RN Outcome: Adequate for Discharge 08/01/2020 0800 by Zachery Dakins, RN Outcome: Progressing   Problem: Skin Integrity: Goal: Risk for impaired skin integrity will decrease 08/01/2020 1044 by Zachery Dakins, RN Outcome: Adequate for Discharge 08/01/2020 0800 by Zachery Dakins, RN Outcome: Progressing   Problem: Acute Rehab PT Goals(only PT should resolve) Goal: Patient Will Transfer Sit To/From Stand Outcome: Adequate for Discharge Goal: Pt Will Transfer Bed To Chair/Chair To Bed Outcome: Adequate for Discharge Goal: Pt Will Ambulate Outcome: Adequate for Discharge Goal: Pt Will Go Up/Down Stairs Outcome: Adequate for Discharge

## 2020-08-01 NOTE — Discharge Instructions (Signed)
1) You are strongly advised to isolate/quarantine for at least 21 days from the date of your diagnosis with COVID-19 infection--please always wear a mask if you have to go outside the house  2)You advised to get Moderna or Pfizer  Covid Vaccine in about  3 weeks from now to reduce your chance of getting severe Covid 19 reinfection   3)Please take medications as prescribed--- lisinopril/HCTZ has been discontinued due to low blood pressure, amlodipine has been reduced to 5 mg daily from 10 mg daily due to low blood pressure --Lasix has been increased to 40 mg twice a day due to congestive heart failure  4)Video/Virtual follow-up visit with primary care physician in about a week advised  5) repeat CBC and BMP blood test in about 1 week  6) complete abstinence from tobacco advised-May use over-the-counter nicotine patch to help you quit smoking

## 2020-08-01 NOTE — Discharge Summary (Signed)
Marie West, is a 65 y.o. female  DOB [birth year]  MRN [id number].  Admission date:  07/28/2020  Admitting Physician  Marie Annett Gula, MD  Discharge Date:  08/01/2020   Primary MD  [facility]  Recommendations for primary care physician for things to follow:     Admission Diagnosis  Hypoxia [R09.02] COPD exacerbation (HCC) [J44.1] Pneumonia due to COVID-19 virus [U07.1, J12.82]   Discharge Diagnosis  Hypoxia [R09.02] COPD exacerbation (HCC) [J44.1] Pneumonia due to COVID-19 virus [U07.1, J12.82]    Principal Problem:   Pneumonia due to COVID-19 virus Active Problems:   Tobacco abuse   Acute diastolic CHF (congestive heart failure) (HCC)   COPD (chronic obstructive pulmonary disease) (HCC)   Morbid obesity (HCC)   Acute respiratory failure with hypoxia (HCC)   Hypertension   Non-insulin dependent type 2 diabetes mellitus (HCC)   Hypothyroidism   Acute exacerbation of chronic obstructive pulmonary disease (COPD) (HCC)      Past Medical History:  Diagnosis Date  . Acute respiratory failure (HCC)   . Arthritis   . Bronchitis   . CHF (congestive heart failure) (HCC)   . COPD with exacerbation (HCC)   . Fibromyalgia   . Headache(784.0)   . Hypertension   . Hypertensive urgency   . Hyperthyroidism   . MRSA infection   . Pneumonia   . Retinal detachment   . Sciatic leg pain   . Shortness of breath     Past Surgical History:  Procedure Laterality Date  . KNEE SURGERY    . TUBAL LIGATION       HPI  from the history and physical done on the day of admission:     Marie West  is a 65 y.o. female, with history of tobacco abuse, morbid obesity, hypothyroidism, hypertension, COPD, CHF, and more presents to the ED with a chief complaint of dyspnea. Patient reports it started 3 days ago. The first day she felt weak and fatigued and slept the whole day,  she reports that she does not remember the day. The next day she was up a little bit still feeling dizzy still with general malaise. Her daughter then brought her into the hospital. She reports that she has normal smoker's cough but her cough is changed as it is normally not violent and normally has white sputum now its green sputum and she goes into violent coughing spells. Patient reports that she felt feverish but has not measured her temperature. She reports that she has tried her Symbicort and albuterol inhalers at home and they have helped but only temporarily. She thinks she has had a decrease in urine output as well, but reports that since she was sleeping all day she probably was not drinking anything either. She reports her shortness of breath is worse with exertion as is the dizziness. Both are better with rest. She uses a cane or walker to get around the home, but reports that she has felt unsteady even with her walker because of the  shortness of breath. She has associated chest tightness when she coughs. Patient denies peripheral edema weight gain. She denies any palpitations. Patient has no other complaints right now.  Patient does smoke and is working on quitting. She smokes half a pack per day. She drinks alcohol once a month. She does not use illicit drugs. She has had two vaccines for Covid. Patient is full code.  In the ED Temp 98.7, heart rate 73, respiratory rate 25, blood pressure 146/96, O2 sats as low as 88%, corrected to 93% with 2 L nasal cannula White blood cell count 5.2, hemoglobin 13.2 Chemistry panel is unremarkable Covid antigen is negative, Covid PCR pending EKG shows a heart rate of 78, QTc 471, sinus rhythm BNP 78 Patient was given Tylenol, albuterol, Decadron in the ED Chest x-ray shows mild vascular congestion.     Hospital Course:     Brief Narrative:  Marie West was admitted to the hospital with a working diagnosis of acute hypoxic respiratory failure due  toCHFexacerbation.  65 year old female past medical history for hypothyroidism, hypertension, COPD, heart failure, tobacco abuse and obesity class III who presented with dyspnea. Patient reported 3 days of productive cough, subjective fevers, and increased sleepiness. Worsening dyspnea on exertion and dizziness. On her initial physical examination temperature was 98.7, heart rate 73, respiratory 25, blood pressure 146/96, oxygen saturation 88% on room air. Her lungs had diffuse wheezing, no rales or rhonchi, heart S1-S2, present rhythmic, abdomen protuberant, no lower extremity edema.  Sodium 139, potassium 4.3, chloride 103, bicarb 27, glucose 83 BUN 17, creatinine 0.68,white count 5.2, hemoglobin 13.2, hematocrit 42.1, platelets 260. Venous blood gas pH 7.38. SARS COVID-19 antigen negative.  Chest radiograph with cardiomegaly, positive hilar vascular congestion, predominantly on the left.  EKG 78 bpm, normal axis, normal intervals, sinus rhythm, poor R wave progression, no ST segment or T wave changes.  Patient placed on aggressive diuresis to target a negative fluid balance.   Assessment & Plan:   Principal Problem:   Acute diastolic CHF (congestive heart failure) (HCC) Active Problems:   Tobacco abuse   Morbid obesity (HCC)   Acute respiratory failure with hypoxia (HCC)   Non-insulin dependent type 2 diabetes mellitus (HCC)   Hypothyroidism   COPD (chronic obstructive pulmonary disease) (HCC)   Pneumonia due to COVID-19 virus   1. Acute hypoxemic respiratory failure due CHF exacerbation as well as SARS covid 19 viral infection Patient was treated with IV Solu-Medrol, remdesivir, bronchodilators mucolytics and supplemental oxygen -Hypoxia has resolved, okay to discharge on p.o. prednisone  2)HFpEF--- echo from 07/30/2020 with preserved EF of 60 to 65%, -Clinical exam and chest x-ray suggestive of CHF exacerbation,  -Much improved with IV Lasix,  -Wt is Down to 315  pounds from 324 pounds on 07/30/2020 -Hypoxia has resolved --Okay to discharge home on p.o. Lasix   3)COPD/Tobacco Abuse---  Suspect some degree of COPD exacerbation, treated with IV steroids, bronchodilators and mucolytics as above #1 -Nicotine patch OTC advised -Discharge on p.o. prednisone -  4. T2DM uncontrolled with hyperglycemia/ dyslipidemia. --A1c 6.6 reflecting excellent diabetic control PTA -Anticipate glycemic control will improve with steroid taper okay to resume preadmission Metformin -c/n Zetia   5. Morbid Obesity- -Low calorie diet, portion control and increase physical activity discussed with patient -Body mass index is 62.11 kg/m.  6. HTN--soft BP so decrease amlodipine to 5 mg daily,   Stop HCTZ  As she is on Lasix    7) Hypothyroid. On levothyroxine   Dispo: The  patient is from: Home  Anticipated d/c is to: Home  Code Status:              full   Discharge Condition: stable, without hypoxia  Follow UP----PCP   Diet and Activity recommendation:  As advised  Discharge Instructions    Discharge Instructions    Diet - low sodium heart healthy   Complete by: As directed    Discharge instructions   Complete by: As directed    1) You are strongly advised to isolate/quarantine for at least 21 days from the date of your diagnosis with COVID-19 infection--please always wear a mask if you have to go outside the house  2)You advised to get Moderna or Pfizer  Covid Vaccine in about  3 weeks from now to reduce your chance of getting severe Covid 19 reinfection   3)Please take medications as prescribed--- lisinopril/HCTZ has been discontinued due to low blood pressure, amlodipine has been reduced to 5 mg daily from 10 mg daily due to low blood pressure --Lasix has been increased to 40 mg twice a day due to congestive heart failure  4)Video/Virtual follow-up visit with primary care physician in about a week advised  5) repeat CBC and BMP blood  test in about 1 week  6) complete abstinence from tobacco advised-May use over-the-counter nicotine patch to help you quit smoking   Increase activity slowly   Complete by: As directed         Discharge Medications     Allergies as of 08/01/2020      Reactions   Bee Venom Swelling      Medication List    STOP taking these medications   ibuprofen 200 MG tablet Commonly known as: ADVIL   lisinopril-hydrochlorothiazide 20-12.5 MG tablet Commonly known as: ZESTORETIC     TAKE these medications   acetaminophen 325 MG tablet Commonly known as: TYLENOL Take 2 tablets (650 mg total) by mouth every 6 (six) hours as needed for mild pain, fever or headache (or Fever >/= 101).   albuterol 108 (90 Base) MCG/ACT inhaler Commonly known as: VENTOLIN HFA Inhale 2 puffs into the lungs every 6 (six) hours as needed for wheezing or shortness of breath. What changed: Another medication with the same name was added. Make sure you understand how and when to take each.   albuterol 108 (90 Base) MCG/ACT inhaler Commonly known as: VENTOLIN HFA Inhale 2 puffs into the lungs every 6 (six) hours as needed for wheezing or shortness of breath. What changed: You were already taking a medication with the same name, and this prescription was added. Make sure you understand how and when to take each.   amLODipine 5 MG tablet Commonly known as: NORVASC Take 1 tablet (5 mg total) by mouth daily. What changed:   medication strength  how much to take  additional instructions   aspirin EC 81 MG tablet Take 1 tablet (81 mg total) by mouth daily with breakfast. What changed:   when to take this  additional instructions   budesonide-formoterol 160-4.5 MCG/ACT inhaler Commonly known as: SYMBICORT Inhale 2 puffs into the lungs 2 (two) times daily.   docusate sodium 100 MG capsule Commonly known as: COLACE Take 1 capsule (100 mg total) by mouth every 12 (twelve) hours. While on pain medication.    DULoxetine 60 MG capsule Commonly known as: CYMBALTA Take 60 mg by mouth daily. Reported on 11/17/2015   furosemide 40 MG tablet Commonly known as: LASIX Take 1 tablet (  40 mg total) by mouth 2 (two) times daily. What changed:   medication strength  how much to take  when to take this  reasons to take this   guaiFENesin-dextromethorphan 100-10 MG/5ML syrup Commonly known as: ROBITUSSIN DM Take 10 mLs by mouth every 4 (four) hours as needed for cough.   ipratropium-albuterol 0.5-2.5 (3) MG/3ML Soln Commonly known as: DUONEB Take 3 mLs by nebulization every 4 (four) hours as needed (wheezing).   levothyroxine 50 MCG tablet Commonly known as: SYNTHROID Take 50 mcg by mouth daily before breakfast. Reported on 11/17/2015   loratadine 10 MG tablet Commonly known as: CLARITIN Take 10 mg by mouth daily.   Melatonin 10 MG Caps Take 1 capsule by mouth at bedtime.   meloxicam 7.5 MG tablet Commonly known as: Mobic Take 1 tablet (7.5 mg total) by mouth daily.   metFORMIN 500 MG tablet Commonly known as: GLUCOPHAGE Take 1 tablet (500 mg total) by mouth daily with breakfast. What changed:   when to take this  additional instructions   oxyCODONE-acetaminophen 5-325 MG tablet Commonly known as: PERCOCET/ROXICET Take 1-2 tablets by mouth every 8 (eight) hours as needed for severe pain. What changed: Another medication with the same name was removed. Continue taking this medication, and follow the directions you see here.   potassium chloride 10 MEQ tablet Commonly known as: KLOR-CON Take 1 tablet (10 mEq total) by mouth daily. Take While taking Lasix/furosemide   predniSONE 50 MG tablet Commonly known as: DELTASONE Take 1 tablet (50 mg total) by mouth daily with breakfast. Start taking on: August 02, 2020   Zetia 10 MG tablet Generic drug: ezetimibe Take 1 tablet by mouth daily.       Major procedures and Radiology Reports - PLEASE review detailed and final  reports for all details, in brief -    DG Chest 1 View  Result Date: 07/30/2020 CLINICAL DATA:  COVID positivity with shortness of breath, initial encounter EXAM: CHEST  1 VIEW COMPARISON:  07/28/2020 FINDINGS: Cardiac shadow is enlarged but stable. Increased central vascular congestion is noted. Mild patchy airspace opacity is noted likely related to edema. No focal confluent infiltrate is seen. No bony abnormality is noted. IMPRESSION: Increased central vascular congestion and likely parenchymal edema. No focal confluent infiltrate is seen. Electronically Signed   By: Alcide CleverMark  Lukens M.D.   On: 07/30/2020 10:50   DG Chest Port 1 View  Result Date: 07/28/2020 CLINICAL DATA:  Shortness of breath EXAM: PORTABLE CHEST 1 VIEW COMPARISON:  September 16, 2016 FINDINGS: FINDINGS The heart size and mediastinal contours are within normal limits. There is prominence of the central pulmonary vasculature. The visualized skeletal structures are unremarkable. IMPRESSION: Mild pulmonary vascular congestion Electronically Signed   By: Jonna ClarkBindu  Avutu M.D.   On: 07/28/2020 20:04   ECHOCARDIOGRAM COMPLETE  Result Date: 07/30/2020    ECHOCARDIOGRAM REPORT   Patient Name:   Marie West Date of Exam: 07/30/2020 Medical Rec #:  [id number]       Height:       60.0 in Accession #:    [id number]      Weight:       324.1 lb Date of Birth:  [birth year]      BSA:          2.292 m Patient Age:    64 years        BP:           129/73 mmHg Patient Gender: F  HR:           63 bpm. [facility] Procedure: 2D Echo Indications:    Dyspnea R06.00  History:        Patient has prior history of Echocardiogram examinations, most                 recent 05/14/2018. CHF, COPD; Risk Factors:Current Smoker,                 Hypertension and Diabetes. Covid 19.  Sonographer:    Jeryl Columbia RDCS (AE) Referring Phys: [id number] York Ram ARRIEN IMPRESSIONS  1. Left ventricular ejection fraction, by estimation, is 60 to 65%.  The left ventricle has normal function. The left ventricle has no regional wall motion abnormalities. There is mild left ventricular hypertrophy. Left ventricular diastolic parameters were normal.  2. Right ventricular systolic function is normal. The right ventricular size is normal.  3. The mitral valve is normal in structure. No evidence of mitral valve regurgitation. No evidence of mitral stenosis.  4. The aortic valve has an indeterminant number of cusps. There is moderate calcification of the aortic valve. There is moderate thickening of the aortic valve. Aortic valve regurgitation is not visualized. No aortic stenosis is present.  5. The inferior vena cava is normal in size with greater than 50% respiratory variability, suggesting right atrial pressure of 3 mmHg. FINDINGS  Left Ventricle: Left ventricular ejection fraction, by estimation, is 60 to 65%. The left ventricle has normal function. The left ventricle has no regional wall motion abnormalities. The left ventricular internal cavity size was normal in size. There is  mild left ventricular hypertrophy. Left ventricular diastolic parameters were normal. Right Ventricle: The right ventricular size is normal. No increase in right ventricular wall thickness. Right ventricular systolic function is normal. Left Atrium: Left atrial size was normal in size. Right Atrium: Right atrial size was normal in size. Pericardium: There is no evidence of pericardial effusion. Mitral Valve: The mitral valve is normal in structure. Mild mitral annular calcification. No evidence of mitral valve regurgitation. No evidence of mitral valve stenosis. Tricuspid Valve: The tricuspid valve is normal in structure. Tricuspid valve regurgitation is not demonstrated. No evidence of tricuspid stenosis. Aortic Valve: The aortic valve has an indeterminant number of cusps. There is moderate calcification of the aortic valve. There is moderate thickening of the aortic valve. There is  moderate aortic valve annular calcification. Aortic valve regurgitation is not visualized. No aortic stenosis is present. Aortic valve mean gradient measures 7.0 mmHg. Aortic valve peak gradient measures 14.7 mmHg. Aortic valve area, by VTI measures 2.09 cm. Pulmonic Valve: The pulmonic valve was not well visualized. Pulmonic valve regurgitation is not visualized. No evidence of pulmonic stenosis. Aorta: The aortic root is normal in size and structure. Pulmonary Artery: Indeterminant PASP, inadequate TR jet. Venous: The inferior vena cava is normal in size with greater than 50% respiratory variability, suggesting right atrial pressure of 3 mmHg. IAS/Shunts: No atrial level shunt detected by color flow Doppler.  LEFT VENTRICLE PLAX 2D LVIDd:         4.30 cm  Diastology LVIDs:         3.20 cm  LV e' medial:    9.46 cm/s LV PW:         1.10 cm  LV E/e' medial:  10.3 LV IVS:        1.10 cm  LV e' lateral:   6.74 cm/s LVOT diam:  2.00 cm  LV E/e' lateral: 14.5 LV SV:         84 LV SV Index:   36 LVOT Area:     3.14 cm  RIGHT VENTRICLE RV S prime:     18.00 cm/s LEFT ATRIUM           Index       RIGHT ATRIUM           Index LA diam:      3.90 cm 1.70 cm/m  RA Area:     16.60 cm LA Vol (A2C): 21.7 ml 9.47 ml/m  RA Volume:   45.60 ml  19.90 ml/m LA Vol (A4C): 41.0 ml 17.89 ml/m  AORTIC VALVE AV Area (Vmax):    1.96 cm AV Area (Vmean):   1.93 cm AV Area (VTI):     2.09 cm AV Vmax:           192.00 cm/s AV Vmean:          125.000 cm/s AV VTI:            0.399 m AV Peak Grad:      14.7 mmHg AV Mean Grad:      7.0 mmHg LVOT Vmax:         120.00 cm/s LVOT Vmean:        76.700 cm/s LVOT VTI:          0.266 m LVOT/AV VTI ratio: 0.67  AORTA Ao Root diam: 2.50 cm MITRAL VALVE MV Area (PHT): 2.72 cm    SHUNTS MV Decel Time: 279 msec    Systemic VTI:  0.27 m MV E velocity: 97.40 cm/s  Systemic Diam: 2.00 cm MV A velocity: 87.50 cm/s MV E/A ratio:  1.11 Dina Rich MD Electronically signed by Dina Rich MD  Signature Date/Time: 07/30/2020/12:14:42 PM    Final     Micro Results    Recent Results (from the past 240 hour(s))  SARS CORONAVIRUS 2 (TAT 6-24 HRS) Nasopharyngeal Nasopharyngeal Swab     Status: Abnormal   Collection Time: 07/28/20  9:29 PM   Specimen: Nasopharyngeal Swab  Result Value Ref Range Status   SARS Coronavirus 2 POSITIVE (A) NEGATIVE Final    Comment: (NOTE) SARS-CoV-2 target nucleic acids are DETECTED.  The SARS-CoV-2 RNA is generally detectable in upper and lower respiratory specimens during the acute phase of infection. Positive results are indicative of the presence of SARS-CoV-2 RNA. Clinical correlation with patient history and other diagnostic information is  necessary to determine patient infection status. Positive results do not rule out bacterial infection or co-infection with other viruses.  The expected result is Negative.  Fact Sheet for Patients: HairSlick.no  Fact Sheet for Healthcare Providers: quierodirigir.com  This test is not yet approved or cleared by the [place] FDA and  has been authorized for detection and/or diagnosis of SARS-CoV-2 by FDA under an Emergency Use Authorization (EUA). This EUA will remain  in effect (meaning this test can be used) for the duration of the COVID-19 declaration under Section 564(b)(1) of the Act, 21 U. S.C. section 360bbb-3(b)(1), unless the authorization is terminated or revoked sooner.   Performed at [facility], [address] [place], [place] [address]      Today   Subjective    Marie West today has no new complaints, No fever  Or chills   No Nausea, Vomiting or Diarrhea  ---drinking well, ambulating around the room without hypoxia   Patient has been seen  and examined prior to discharge   Objective   Blood pressure (!) 107/57, pulse 61, temperature (!) 96.9 F (36.1 C), resp. rate 19, height 5' (1.524 m), weight (!)  143.2 kg, SpO2 95 %.   Intake/Output Summary (Last 24 hours) at 08/01/2020 1226 Last data filed at 08/01/2020 0804 Gross per 24 hour  Intake 1030 ml  Output 3000 ml  Net -1970 ml    Exam Gen:- Awake Alert, no acute distress , no conversational dyspnea HEENT:- [place].AT, No sclera icterus Neck-Supple Neck,No JVD,.  Lungs-much improved air movement, no wheezing CV- S1, S2 normal, regular Abd-  +ve B.Sounds, Abd Soft, No tenderness, increased truncal adiposity    Extremity/Skin:- No  edema,   good pulses Psych-affect is appropriate, oriented x3 Neuro-no new focal deficits, no tremors    Data Review   CBC w Diff:  Lab Results  Component Value Date   WBC 7.0 07/29/2020   HGB 13.0 07/29/2020   HCT 40.7 07/29/2020   PLT 261 07/29/2020   LYMPHOPCT 25 07/28/2020   MONOPCT 14 07/28/2020   EOSPCT 4 07/28/2020   BASOPCT 0 07/28/2020    CMP:  Lab Results  Component Value Date   NA 135 08/01/2020   K 3.7 08/01/2020   CL 97 (L) 08/01/2020   CO2 25 08/01/2020   BUN 49 (H) 08/01/2020   CREATININE 0.91 08/01/2020   PROT 7.1 08/01/2020   ALBUMIN 3.5 08/01/2020   BILITOT 0.6 08/01/2020   ALKPHOS 51 08/01/2020   AST 16 08/01/2020   ALT 22 08/01/2020  .  Total Discharge time is about 33 minutes  Shon Hale M.D on 08/01/2020 at 12:26 PM  Go to [email or web address] -  for contact info  [facility] - Office  [phone]

## 2020-08-27 ENCOUNTER — Other Ambulatory Visit: Payer: Self-pay

## 2020-08-27 ENCOUNTER — Emergency Department (HOSPITAL_COMMUNITY)
Admission: EM | Admit: 2020-08-27 | Discharge: 2020-08-27 | Disposition: A | Discharge status: Home / Self Care | Payer: Medicare HMO | Attending: Emergency Medicine | Admitting: Emergency Medicine

## 2020-08-27 ENCOUNTER — Encounter (HOSPITAL_COMMUNITY): Payer: Self-pay | Admitting: *Deleted

## 2020-08-27 DIAGNOSIS — I5031 Acute diastolic (congestive) heart failure: Secondary | ICD-10-CM | POA: Insufficient documentation

## 2020-08-27 DIAGNOSIS — S39012A Strain of muscle, fascia and tendon of lower back, initial encounter: Secondary | ICD-10-CM | POA: Diagnosis not present

## 2020-08-27 DIAGNOSIS — Z7982 Long term (current) use of aspirin: Secondary | ICD-10-CM | POA: Diagnosis not present

## 2020-08-27 DIAGNOSIS — Z7984 Long term (current) use of oral hypoglycemic drugs: Secondary | ICD-10-CM | POA: Diagnosis not present

## 2020-08-27 DIAGNOSIS — M79604 Pain in right leg: Secondary | ICD-10-CM | POA: Insufficient documentation

## 2020-08-27 DIAGNOSIS — E119 Type 2 diabetes mellitus without complications: Secondary | ICD-10-CM | POA: Diagnosis not present

## 2020-08-27 DIAGNOSIS — J441 Chronic obstructive pulmonary disease with (acute) exacerbation: Secondary | ICD-10-CM | POA: Diagnosis not present

## 2020-08-27 DIAGNOSIS — Z79899 Other long term (current) drug therapy: Secondary | ICD-10-CM | POA: Insufficient documentation

## 2020-08-27 DIAGNOSIS — X58XXXA Exposure to other specified factors, initial encounter: Secondary | ICD-10-CM | POA: Insufficient documentation

## 2020-08-27 DIAGNOSIS — F17210 Nicotine dependence, cigarettes, uncomplicated: Secondary | ICD-10-CM | POA: Diagnosis not present

## 2020-08-27 DIAGNOSIS — I110 Hypertensive heart disease with heart failure: Secondary | ICD-10-CM | POA: Diagnosis not present

## 2020-08-27 DIAGNOSIS — M79605 Pain in left leg: Secondary | ICD-10-CM | POA: Diagnosis not present

## 2020-08-27 DIAGNOSIS — S3992XA Unspecified injury of lower back, initial encounter: Secondary | ICD-10-CM | POA: Diagnosis present

## 2020-08-27 MED ORDER — KETOROLAC TROMETHAMINE 30 MG/ML IJ SOLN
15.0000 mg | Freq: Once | INTRAMUSCULAR | Status: AC
  Administered 2020-08-27: 15 mg via INTRAMUSCULAR
  Filled 2020-08-27: qty 1

## 2020-08-27 MED ORDER — CYCLOBENZAPRINE HCL 10 MG PO TABS: 10.0000 mg | ORAL_TABLET | Freq: Two times a day (BID) | ORAL | 0 refills | Status: DC | PRN

## 2020-08-27 MED ORDER — PREDNISONE 20 MG PO TABS: 40.0000 mg | ORAL_TABLET | Freq: Every day | ORAL | 0 refills | Status: AC

## 2020-08-27 NOTE — ED Triage Notes (Signed)
C/o pain in back radiating down both legs onset this am

## 2020-08-27 NOTE — ED Provider Notes (Signed)
[facility] Provider Note   CSN: [id number] Arrival date & time: 08/27/20  1520     History Chief Complaint  Patient presents with  . Back Pain    [facility] is a 65 y.o. female.  HPI   Patient with significant medical history of CHF, COPD, fibromyalgia, sciatica, stenosis of L3-L5 presents with chief complaint of lower back pain.  Patient endorses back pain started suddenly this morning, she endorses a sharp throbbing sensation in her lower back with pain rating down both legs around her thighs and hamstrings, describes it as a burning sensation.  She denies associated paresthesia or weakness in her lower extremities, urinary retention, incontinency, difficulty with bowel movements.  She does endorse in the past she has urinated on herself but she thinks it was mainly from not getting to the bathroom on time.  She denies recent trauma to the area, denies IV drug use, denies autoimmune diseases.  After reviewing patient's chart patient was managed by Dr. Laurian West of neurosurger and he feels patient is suffering lumbar reticular symptoms in L4-L5 and S1, unfortunate patient does not qualify for surgery due to her weight.  Unfortunately patient tested positive for opioids, fentanyl on urine drug screen and was dropped.  Patient states she has been taking over-the-counter pain medication without  relief.  Patient denies  alleviating factors.  Patient denies headaches, fevers, chills, shortness of breath, chest pain, abdominal pain, nausea, vomiting, diarrhea, worsening pedal edema.  Past Medical History:  Diagnosis Date  . Acute respiratory failure (HCC)   . Arthritis   . Bronchitis   . CHF (congestive heart failure) (HCC)   . COPD with exacerbation (HCC)   . Fibromyalgia   . Headache(784.0)   . Hypertension   . Hypertensive urgency   . Hyperthyroidism   . MRSA infection   . Pneumonia   . Retinal detachment   . Sciatic leg pain   . Shortness of breath      Patient Active Problem List   Diagnosis Date Noted  . Pneumonia due to COVID-19 virus 07/30/2020  . COPD exacerbation (HCC) 07/29/2020  . Acute diastolic CHF (congestive heart failure) (HCC) 07/29/2020  . COPD (chronic obstructive pulmonary disease) (HCC) 07/29/2020  . Acute dyspnea 11/17/2015  . Depression 11/17/2015  . Hypertension 11/17/2015  . Non-insulin dependent type 2 diabetes mellitus (HCC) 11/17/2015  . Hypothyroidism 11/17/2015  . Acute exacerbation of chronic obstructive pulmonary disease (COPD) (HCC)   . Hypoxia   . CAP (community acquired pneumonia) 07/09/2013  . Acute respiratory failure (HCC) 07/09/2013  . Pleural effusion 07/09/2013  . Hypertensive urgency 07/09/2013  . Acute respiratory failure with hypoxia (HCC) 07/09/2013  . COPD with exacerbation (HCC) 06/01/2012  . Tobacco abuse 06/01/2012  . Morbid obesity (HCC) 06/01/2012  . Arthritis 06/01/2012  . possible CHF (congestive heart failure) 06/01/2012    Past Surgical History:  Procedure Laterality Date  . KNEE SURGERY    . TUBAL LIGATION       OB History   No obstetric history on file.     Family History  Problem Relation Age of Onset  . Diabetes Mother   . Heart disease Mother        died AMI, age 46  . Heart disease Brother   . Diabetes Sister     Social History   Tobacco Use  . Smoking status: Current Some Day Smoker    Packs/day: 0.50    Years: 36.00    Pack years:  18.00    Types: Cigarettes    Start date: 06/04/1971  . Smokeless tobacco: Never Used  Substance Use Topics  . Alcohol use: Yes    Comment: occ-wine  . Drug use: No    Home Medications Prior to Admission medications   Medication Sig Start Date End Date Taking? Authorizing Provider  cyclobenzaprine (FLEXERIL) 10 MG tablet Take 1 tablet (10 mg total) by mouth 2 (two) times daily as needed for muscle spasms. 08/27/20  Yes Carroll SageFaulkner, William J, PA-C  predniSONE (DELTASONE) 20 MG tablet Take 2 tablets (40 mg total)  by mouth daily for 5 days. 08/27/20 09/01/20 Yes Carroll SageFaulkner, William J, PA-C  acetaminophen (TYLENOL) 325 MG tablet Take 2 tablets (650 mg total) by mouth every 6 (six) hours as needed for mild pain, fever or headache (or Fever >/= 101). 08/01/20   Shon HaleEmokpae, Courage, MD  albuterol (PROVENTIL HFA;VENTOLIN HFA) 108 (90 BASE) MCG/ACT inhaler Inhale 2 puffs into the lungs every 6 (six) hours as needed for wheezing or shortness of breath. 07/11/13   Black, Lesle ChrisKaren M, NP  albuterol (VENTOLIN HFA) 108 (90 Base) MCG/ACT inhaler Inhale 2 puffs into the lungs every 6 (six) hours as needed for wheezing or shortness of breath. 08/01/20   Shon HaleEmokpae, Courage, MD  amLODipine (NORVASC) 5 MG tablet Take 1 tablet (5 mg total) by mouth daily. 08/01/20   Shon HaleEmokpae, Courage, MD  aspirin EC 81 MG tablet Take 1 tablet (81 mg total) by mouth daily with breakfast. 08/01/20   Shon HaleEmokpae, Courage, MD  budesonide-formoterol (SYMBICORT) 160-4.5 MCG/ACT inhaler Inhale 2 puffs into the lungs 2 (two) times daily. 08/01/20   Shon HaleEmokpae, Courage, MD  docusate sodium (COLACE) 100 MG capsule Take 1 capsule (100 mg total) by mouth every 12 (twelve) hours. While on pain medication. Patient not taking: No sig reported 09/16/16   Mesner, Barbara CowerJason, MD  DULoxetine (CYMBALTA) 60 MG capsule Take 60 mg by mouth daily. Reported on 11/17/2015    [provider]  furosemide (LASIX) 40 MG tablet Take 1 tablet (40 mg total) by mouth 2 (two) times daily. 08/01/20   Shon HaleEmokpae, Courage, MD  guaiFENesin-dextromethorphan (ROBITUSSIN DM) 100-10 MG/5ML syrup Take 10 mLs by mouth every 4 (four) hours as needed for cough. 08/01/20   Shon HaleEmokpae, Courage, MD  ipratropium-albuterol (DUONEB) 0.5-2.5 (3) MG/3ML SOLN Take 3 mLs by nebulization every 4 (four) hours as needed (wheezing). Patient not taking: Reported on 07/28/2020 09/24/15   Lavera GuiseLiu, Dana Duo, MD  levothyroxine (SYNTHROID, LEVOTHROID) 50 MCG tablet Take 50 mcg by mouth daily before breakfast. Reported on 11/17/2015    [provider]  loratadine (CLARITIN) 10 MG tablet Take 10 mg by mouth daily.    [provider]  Melatonin 10 MG CAPS Take 1 capsule by mouth at bedtime.    [provider]  meloxicam (MOBIC) 7.5 MG tablet Take 1 tablet (7.5 mg total) by mouth daily. Patient not taking: No sig reported 02/01/17   Robinson, Swazil[place] N, PA-C  metFORMIN (GLUCOPHAGE) 500 MG tablet Take 1 tablet (500 mg total) by mouth daily with breakfast. 08/01/20   Shon HaleEmokpae, Courage, MD  oxyCODONE-acetaminophen (PERCOCET/ROXICET) 5-325 MG tablet Take 1-2 tablets by mouth every 8 (eight) hours as needed for severe pain. Patient not taking: No sig reported 09/16/16   Mesner, Barbara CowerJason, MD  potassium chloride (KLOR-CON) 10 MEQ tablet Take 1 tablet (10 mEq total) by mouth daily. Take While taking Lasix/furosemide 08/01/20   Shon HaleEmokpae, Courage, MD  predniSONE (DELTASONE) 50 MG tablet Take 1 tablet (  50 mg total) by mouth daily with breakfast. 08/02/20   Emokpae, Courage, MD  ZETIA 10 MG tablet Take 1 tablet by mouth daily. 09/02/16   [provider]    Allergies    Bee venom  Review of Systems   Review of Systems  Constitutional: Negative for chills and fever.  HENT: Negative for congestion and sore throat.   Respiratory: Negative for shortness of breath.   Cardiovascular: Negative for chest pain.  Gastrointestinal: Negative for abdominal pain, constipation, diarrhea and vomiting.  Genitourinary: Negative for enuresis and flank pain.  Musculoskeletal: Positive for back pain.  Skin: Negative for rash.  Neurological: Negative for headaches.  Hematological: Does not bruise/bleed easily.    Physical Exam Updated Vital Signs BP (!) 157/91 (BP Location: Right Arm)   Pulse 71   Temp 98.5 F (36.9 C)   Resp 17   SpO2 94%   Physical Exam Vitals and nursing note reviewed.  Constitutional:      General: She is not in acute distress.    Appearance: She is not ill-appearing.  HENT:     Head: Normocephalic and  atraumatic.     Nose: No congestion.  Eyes:     Conjunctiva/sclera: Conjunctivae normal.  Cardiovascular:     Rate and Rhythm: Normal rate and regular rhythm.     Pulses: Normal pulses.     Heart sounds: No murmur heard. No friction rub. No gallop.   Pulmonary:     Effort: No respiratory distress.     Breath sounds: No wheezing, rhonchi or rales.  Abdominal:     Palpations: Abdomen is soft.     Tenderness: There is no abdominal tenderness. There is no right CVA tenderness or left CVA tenderness.  Musculoskeletal:     Comments: Patient spine was palpated nontender to palpation, no step-off or deformities present.  She was noted to be tender along lumbar paraspinal muscles.  Patient had full range of motion 5/5 strength in her lower extremities, neurovascularly intact, she was able to ambulate with help as she normally uses a walker.  Positive straight leg raise.  Skin:    General: Skin is warm and dry.  Neurological:     Mental Status: She is alert.  Psychiatric:        Mood and Affect: Mood normal.     ED Results / Procedures / Treatments   Labs (all labs ordered are listed, but only abnormal results are displayed) Labs Reviewed - No data to display  EKG None  Radiology No results found.  Procedures Procedures   Medications Ordered in ED Medications  ketorolac (TORADOL) 30 MG/ML injection 15 mg (15 mg Intramuscular Given 08/27/20 1634)    ED Course  I have reviewed the triage vital signs and the nursing notes.  Pertinent labs & imaging results that were available during my care of the patient were reviewed by me and considered in my medical decision making (see chart for details).    MDM Rules/Calculators/A&P                         Initial impression-patient presents with lower lumbar pain.  She is alert, does not appear in acute distress, vital signs reassuring.  Work-up-due to well-appearing patient, benign physical exam further lab or imaging are warranted at  this time.  Rule out- I have low suspicion for spinal fracture or spinal cord abnormality as patient denies urinary incontinency, retention, difficulty with bowel movements, denies saddle  paresthesias.  Spine was palpated there is no step-off, crepitus or gross deformities felt, patient had 5/5 strength, full range of motion, neurovascular fully intact in the lower extremities was able to ambulate.  Patient did endorse incontinency in the past but this did not happen today.  I suspect is secondary due to inability to get to the restroom due to her pain.  I recommend rectal exam to evaluate sphincter tone, patient declined stating she does not feel this is necessary.  Will defer imaging as patient did not have recent trauma to the area.  She is had imaging of her lumbar spine previously . Low suspicion for septic arthritis as patient denies IV drug use, skin exam was performed no erythematous, edema or warm joints noted.   Plan-suspect patient suffering from worsening spondylosis or medical 3 to L4.  Will provide patient with steroids, muscle relaxers have her follow-up with neurosurgery for further evaluation.  Vital signs have remained stable, no indication for hospital admission. Patient given at home care as well strict return precautions.  Patient verbalized that they understood agreed to said plan.   Final Clinical Impression(s) / ED Diagnoses Final diagnoses:  Strain of lumbar region, initial encounter    Rx / DC Orders ED Discharge Orders         Ordered    cyclobenzaprine (FLEXERIL) 10 MG tablet  2 times daily PRN        08/27/20 1616    predniSONE (DELTASONE) 20 MG tablet  Daily        08/27/20 1616           Barnie Del 08/27/20 1652    Bethann Berkshire, MD 08/30/20 1135

## 2020-08-27 NOTE — Discharge Instructions (Signed)
You have been seen here for back pain.  Given you prescription for steroids and muscle relaxers please take as prescribed.  Please beware muscle relaxers can make you drowsy do not recommend operating heavy machinery or consume alcohol take this medication.  I recommend taking over-the-counter pain medications like ibuprofen and/or Tylenol every 6 as needed.  Please follow dosage and on the back of bottle.  I also recommend applying heat to the area and stretching out the muscles as this will help decrease stiffness and pain.  I have given you information on exercises please follow.  I want you to follow-up with neurosurgery for further evaluation.  Given contact information above please call to schedule an appointment.  Come back to the emergency department if you develop chest pain, shortness of breath, severe abdominal pain, uncontrolled nausea, vomiting, diarrhea.

## 2021-03-05 ENCOUNTER — Other Ambulatory Visit: Payer: Self-pay | Admitting: Neurosurgery

## 2021-03-05 DIAGNOSIS — M48061 Spinal stenosis, lumbar region without neurogenic claudication: Secondary | ICD-10-CM

## 2021-03-21 ENCOUNTER — Other Ambulatory Visit: Payer: Medicare HMO

## 2021-04-03 ENCOUNTER — Other Ambulatory Visit: Payer: Medicare HMO

## 2021-04-05 ENCOUNTER — Inpatient Hospital Stay (HOSPITAL_COMMUNITY)
Admission: EM | Admit: 2021-04-05 | Discharge: 2021-04-07 | DRG: 246 | Disposition: A | Discharge status: Home Health | Payer: Medicare HMO | Attending: Internal Medicine | Admitting: Internal Medicine

## 2021-04-05 ENCOUNTER — Inpatient Hospital Stay (HOSPITAL_COMMUNITY)
Admission: EM | Disposition: A | Discharge status: Home Health | Payer: Self-pay | Source: Home / Self Care | Attending: Internal Medicine

## 2021-04-05 DIAGNOSIS — Z955 Presence of coronary angioplasty implant and graft: Secondary | ICD-10-CM | POA: Diagnosis not present

## 2021-04-05 DIAGNOSIS — I200 Unstable angina: Secondary | ICD-10-CM | POA: Diagnosis not present

## 2021-04-05 DIAGNOSIS — Z79890 Hormone replacement therapy (postmenopausal): Secondary | ICD-10-CM | POA: Diagnosis not present

## 2021-04-05 DIAGNOSIS — I5031 Acute diastolic (congestive) heart failure: Secondary | ICD-10-CM | POA: Diagnosis not present

## 2021-04-05 DIAGNOSIS — Z833 Family history of diabetes mellitus: Secondary | ICD-10-CM | POA: Diagnosis not present

## 2021-04-05 DIAGNOSIS — Z7952 Long term (current) use of systemic steroids: Secondary | ICD-10-CM | POA: Diagnosis not present

## 2021-04-05 DIAGNOSIS — Z20822 Contact with and (suspected) exposure to covid-19: Secondary | ICD-10-CM | POA: Diagnosis present

## 2021-04-05 DIAGNOSIS — Z79899 Other long term (current) drug therapy: Secondary | ICD-10-CM

## 2021-04-05 DIAGNOSIS — J449 Chronic obstructive pulmonary disease, unspecified: Secondary | ICD-10-CM | POA: Diagnosis present

## 2021-04-05 DIAGNOSIS — I252 Old myocardial infarction: Secondary | ICD-10-CM | POA: Diagnosis not present

## 2021-04-05 DIAGNOSIS — I5033 Acute on chronic diastolic (congestive) heart failure: Secondary | ICD-10-CM | POA: Diagnosis not present

## 2021-04-05 DIAGNOSIS — R7303 Prediabetes: Secondary | ICD-10-CM | POA: Diagnosis not present

## 2021-04-05 DIAGNOSIS — Z716 Tobacco abuse counseling: Secondary | ICD-10-CM

## 2021-04-05 DIAGNOSIS — M1990 Unspecified osteoarthritis, unspecified site: Secondary | ICD-10-CM | POA: Diagnosis present

## 2021-04-05 DIAGNOSIS — I110 Hypertensive heart disease with heart failure: Secondary | ICD-10-CM | POA: Diagnosis not present

## 2021-04-05 DIAGNOSIS — I213 ST elevation (STEMI) myocardial infarction of unspecified site: Secondary | ICD-10-CM | POA: Diagnosis not present

## 2021-04-05 DIAGNOSIS — Z720 Tobacco use: Secondary | ICD-10-CM | POA: Diagnosis not present

## 2021-04-05 DIAGNOSIS — Z7982 Long term (current) use of aspirin: Secondary | ICD-10-CM

## 2021-04-05 DIAGNOSIS — E785 Hyperlipidemia, unspecified: Secondary | ICD-10-CM | POA: Diagnosis not present

## 2021-04-05 DIAGNOSIS — E119 Type 2 diabetes mellitus without complications: Secondary | ICD-10-CM | POA: Diagnosis present

## 2021-04-05 DIAGNOSIS — Z7984 Long term (current) use of oral hypoglycemic drugs: Secondary | ICD-10-CM

## 2021-04-05 DIAGNOSIS — R079 Chest pain, unspecified: Secondary | ICD-10-CM | POA: Diagnosis not present

## 2021-04-05 DIAGNOSIS — I2121 ST elevation (STEMI) myocardial infarction involving left circumflex coronary artery: Secondary | ICD-10-CM

## 2021-04-05 DIAGNOSIS — I25110 Atherosclerotic heart disease of native coronary artery with unstable angina pectoris: Secondary | ICD-10-CM

## 2021-04-05 DIAGNOSIS — I10 Essential (primary) hypertension: Secondary | ICD-10-CM | POA: Diagnosis present

## 2021-04-05 DIAGNOSIS — M797 Fibromyalgia: Secondary | ICD-10-CM | POA: Diagnosis present

## 2021-04-05 DIAGNOSIS — Z7951 Long term (current) use of inhaled steroids: Secondary | ICD-10-CM | POA: Diagnosis not present

## 2021-04-05 DIAGNOSIS — Z91030 Bee allergy status: Secondary | ICD-10-CM

## 2021-04-05 DIAGNOSIS — Z8249 Family history of ischemic heart disease and other diseases of the circulatory system: Secondary | ICD-10-CM

## 2021-04-05 DIAGNOSIS — Z6841 Body Mass Index (BMI) 40.0 and over, adult: Secondary | ICD-10-CM

## 2021-04-05 DIAGNOSIS — E782 Mixed hyperlipidemia: Secondary | ICD-10-CM | POA: Diagnosis not present

## 2021-04-05 DIAGNOSIS — I214 Non-ST elevation (NSTEMI) myocardial infarction: Secondary | ICD-10-CM | POA: Diagnosis not present

## 2021-04-05 DIAGNOSIS — Z8614 Personal history of Methicillin resistant Staphylococcus aureus infection: Secondary | ICD-10-CM

## 2021-04-05 HISTORY — PX: LEFT HEART CATH AND CORONARY ANGIOGRAPHY: CATH118249

## 2021-04-05 HISTORY — PX: CORONARY STENT INTERVENTION: CATH118234

## 2021-04-05 LAB — CBC WITH DIFFERENTIAL/PLATELET
Abs Immature Granulocytes: 0.03 10*3/uL (ref 0.00–0.07)
Basophils Absolute: 0 10*3/uL (ref 0.0–0.1)
Basophils Relative: 1 %
Eosinophils Absolute: 0.2 10*3/uL (ref 0.0–0.5)
Eosinophils Relative: 3 %
HCT: 39.1 % (ref 36.0–46.0)
Hemoglobin: 12.1 g/dL (ref 12.0–15.0)
Immature Granulocytes: 0 %
Lymphocytes Relative: 10 %
Lymphs Abs: 0.8 10*3/uL (ref 0.7–4.0)
MCH: 30 pg (ref 26.0–34.0)
MCHC: 30.9 g/dL (ref 30.0–36.0)
MCV: 97 fL (ref 80.0–100.0)
Monocytes Absolute: 0.5 10*3/uL (ref 0.1–1.0)
Monocytes Relative: 6 %
Neutro Abs: 6.3 10*3/uL (ref 1.7–7.7)
Neutrophils Relative %: 80 %
Platelets: 227 10*3/uL (ref 150–400)
RBC: 4.03 MIL/uL (ref 3.87–5.11)
RDW: 14.7 % (ref 11.5–15.5)
WBC: 7.9 10*3/uL (ref 4.0–10.5)
nRBC: 0 % (ref 0.0–0.2)

## 2021-04-05 LAB — COMPREHENSIVE METABOLIC PANEL
ALT: 18 U/L (ref 0–44)
AST: 16 U/L (ref 15–41)
Albumin: 3.3 g/dL — ABNORMAL LOW (ref 3.5–5.0)
Alkaline Phosphatase: 63 U/L (ref 38–126)
Anion gap: 10 (ref 5–15)
BUN: 10 mg/dL (ref 8–23)
CO2: 27 mmol/L (ref 22–32)
Calcium: 8.3 mg/dL — ABNORMAL LOW (ref 8.9–10.3)
Chloride: 106 mmol/L (ref 98–111)
Creatinine, Ser: 0.72 mg/dL (ref 0.44–1.00)
GFR, Estimated: >60 mL/min (ref >60)
Glucose, Bld: 121 mg/dL — ABNORMAL HIGH (ref 70–99)
Potassium: 4.1 mmol/L (ref 3.5–5.1)
Sodium: 143 mmol/L (ref 135–145)
Total Bilirubin: 0.4 mg/dL (ref 0.3–1.2)
Total Protein: 6 g/dL — ABNORMAL LOW (ref 6.5–8.1)

## 2021-04-05 LAB — CBC
HCT: 41 % (ref 36.0–46.0)
Hemoglobin: 13.1 g/dL (ref 12.0–15.0)
MCH: 30.5 pg (ref 26.0–34.0)
MCHC: 32 g/dL (ref 30.0–36.0)
MCV: 95.3 fL (ref 80.0–100.0)
Platelets: 219 10*3/uL (ref 150–400)
RBC: 4.3 MIL/uL (ref 3.87–5.11)
RDW: 14.7 % (ref 11.5–15.5)
WBC: 9 10*3/uL (ref 4.0–10.5)
nRBC: 0 % (ref 0.0–0.2)

## 2021-04-05 LAB — POCT ACTIVATED CLOTTING TIME
Activated Clotting Time: 219 seconds
Activated Clotting Time: 277 seconds

## 2021-04-05 LAB — CREATININE, SERUM
Creatinine, Ser: 0.83 mg/dL (ref 0.44–1.00)
GFR, Estimated: >60 mL/min (ref >60)

## 2021-04-05 LAB — POCT I-STAT, CHEM 8
BUN: 10 mg/dL (ref 8–23)
Calcium, Ion: 1.12 mmol/L — ABNORMAL LOW (ref 1.15–1.40)
Chloride: 104 mmol/L (ref 98–111)
Creatinine, Ser: 0.6 mg/dL (ref 0.44–1.00)
Glucose, Bld: 117 mg/dL — ABNORMAL HIGH (ref 70–99)
HCT: 38 % (ref 36.0–46.0)
Hemoglobin: 12.9 g/dL (ref 12.0–15.0)
Potassium: 4.1 mmol/L (ref 3.5–5.1)
Sodium: 143 mmol/L (ref 135–145)
TCO2: 28 mmol/L (ref 22–32)

## 2021-04-05 LAB — RESP PANEL BY RT-PCR (FLU A&B, COVID) ARPGX2
Influenza A by PCR: NEGATIVE
Influenza B by PCR: NEGATIVE
SARS Coronavirus 2 by RT PCR: NEGATIVE

## 2021-04-05 LAB — PROTIME-INR
INR: 1 (ref 0.8–1.2)
Prothrombin Time: 13.2 seconds (ref 11.4–15.2)

## 2021-04-05 LAB — APTT: aPTT: 30 seconds (ref 24–36)

## 2021-04-05 LAB — TROPONIN I (HIGH SENSITIVITY)
Troponin I (High Sensitivity): 491 ng/L (ref <18)
Troponin I (High Sensitivity): 13601 ng/L (ref <18)

## 2021-04-05 LAB — HEMOGLOBIN A1C
Hgb A1c MFr Bld: 6 % — ABNORMAL HIGH (ref 4.8–5.6)
Mean Plasma Glucose: 125.5 mg/dL

## 2021-04-05 LAB — LIPID PANEL
Cholesterol: 204 mg/dL — ABNORMAL HIGH (ref 0–200)
HDL: 51 mg/dL (ref >40)
LDL Cholesterol: 121 mg/dL — ABNORMAL HIGH (ref 0–99)
Total CHOL/HDL Ratio: 4 RATIO
Triglycerides: 158 mg/dL — ABNORMAL HIGH (ref <150)
VLDL: 32 mg/dL (ref 0–40)

## 2021-04-05 LAB — MRSA NEXT GEN BY PCR, NASAL: MRSA by PCR Next Gen: NOT DETECTED

## 2021-04-05 SURGERY — LEFT HEART CATH AND CORONARY ANGIOGRAPHY
Anesthesia: LOCAL

## 2021-04-05 MED ORDER — ASPIRIN EC 81 MG PO TBEC: 81.0000 mg | DELAYED_RELEASE_TABLET | Freq: Every day | ORAL | Status: DC

## 2021-04-05 MED ORDER — VERAPAMIL HCL 2.5 MG/ML IV SOLN
INTRAVENOUS | Status: AC
  Filled 2021-04-05: qty 2

## 2021-04-05 MED ORDER — MELOXICAM 7.5 MG PO TABS: 7.5000 mg | ORAL_TABLET | Freq: Every day | ORAL | Status: DC

## 2021-04-05 MED ORDER — ASPIRIN 81 MG PO CHEW
81.0000 mg | CHEWABLE_TABLET | Freq: Every day | ORAL | Status: DC
  Administered 2021-04-06 – 2021-04-07 (×2): 81 mg via ORAL
  Filled 2021-04-05 (×2): qty 1

## 2021-04-05 MED ORDER — FUROSEMIDE 10 MG/ML IJ SOLN
INTRAMUSCULAR | Status: DC | PRN
  Administered 2021-04-05: 10 mg via INTRAVENOUS
  Administered 2021-04-05: 30 mg via INTRAVENOUS

## 2021-04-05 MED ORDER — HYDRALAZINE HCL 20 MG/ML IJ SOLN: 10.0000 mg | INTRAMUSCULAR | Status: AC | PRN

## 2021-04-05 MED ORDER — TICAGRELOR 90 MG PO TABS
90.0000 mg | ORAL_TABLET | Freq: Two times a day (BID) | ORAL | Status: DC
  Administered 2021-04-05 – 2021-04-07 (×4): 90 mg via ORAL
  Filled 2021-04-05 (×4): qty 1

## 2021-04-05 MED ORDER — HEPARIN (PORCINE) IN NACL 1000-0.9 UT/500ML-% IV SOLN
INTRAVENOUS | Status: DC | PRN
  Administered 2021-04-05 (×2): 500 mL

## 2021-04-05 MED ORDER — SODIUM CHLORIDE 0.9% FLUSH: 10.0000 mL | INTRAVENOUS | Status: DC | PRN

## 2021-04-05 MED ORDER — LIDOCAINE HCL (PF) 1 % IJ SOLN
INTRAMUSCULAR | Status: AC
  Filled 2021-04-05: qty 30

## 2021-04-05 MED ORDER — SODIUM CHLORIDE 0.9% FLUSH
3.0000 mL | Freq: Two times a day (BID) | INTRAVENOUS | Status: DC
  Administered 2021-04-05 – 2021-04-07 (×4): 3 mL via INTRAVENOUS

## 2021-04-05 MED ORDER — HEPARIN (PORCINE) IN NACL 1000-0.9 UT/500ML-% IV SOLN
INTRAVENOUS | Status: AC
  Filled 2021-04-05: qty 1000

## 2021-04-05 MED ORDER — OXYCODONE-ACETAMINOPHEN 5-325 MG PO TABS
ORAL_TABLET | ORAL | Status: AC
  Filled 2021-04-05: qty 2

## 2021-04-05 MED ORDER — ALBUTEROL SULFATE HFA 108 (90 BASE) MCG/ACT IN AERS: 2.0000 | INHALATION_SPRAY | Freq: Four times a day (QID) | RESPIRATORY_TRACT | Status: DC | PRN

## 2021-04-05 MED ORDER — OXYCODONE-ACETAMINOPHEN 5-325 MG PO TABS
1.0000 | ORAL_TABLET | Freq: Three times a day (TID) | ORAL | Status: DC | PRN
  Administered 2021-04-05 – 2021-04-07 (×6): 2 via ORAL
  Filled 2021-04-05 (×5): qty 2

## 2021-04-05 MED ORDER — HEPARIN SODIUM (PORCINE) 1000 UNIT/ML IJ SOLN
INTRAMUSCULAR | Status: AC
  Filled 2021-04-05: qty 1

## 2021-04-05 MED ORDER — IPRATROPIUM-ALBUTEROL 0.5-2.5 (3) MG/3ML IN SOLN: 3.0000 mL | RESPIRATORY_TRACT | Status: DC | PRN

## 2021-04-05 MED ORDER — SODIUM CHLORIDE 0.9 % IV SOLN: 250.0000 mL | INTRAVENOUS | Status: DC | PRN

## 2021-04-05 MED ORDER — CYCLOBENZAPRINE HCL 10 MG PO TABS: 10.0000 mg | ORAL_TABLET | Freq: Two times a day (BID) | ORAL | Status: DC | PRN

## 2021-04-05 MED ORDER — MELATONIN 5 MG PO TABS
10.0000 mg | ORAL_TABLET | Freq: Every day | ORAL | Status: DC
  Administered 2021-04-06 (×2): 10 mg via ORAL
  Filled 2021-04-05 (×2): qty 2

## 2021-04-05 MED ORDER — MOMETASONE FURO-FORMOTEROL FUM 200-5 MCG/ACT IN AERO
2.0000 | INHALATION_SPRAY | Freq: Two times a day (BID) | RESPIRATORY_TRACT | Status: DC
  Administered 2021-04-06 – 2021-04-07 (×3): 2 via RESPIRATORY_TRACT
  Filled 2021-04-05: qty 8.8

## 2021-04-05 MED ORDER — HYDRALAZINE HCL 20 MG/ML IJ SOLN
INTRAMUSCULAR | Status: DC | PRN
  Administered 2021-04-05 (×2): 10 mg via INTRAVENOUS

## 2021-04-05 MED ORDER — DULOXETINE HCL 60 MG PO CPEP
60.0000 mg | ORAL_CAPSULE | Freq: Every day | ORAL | Status: DC
  Administered 2021-04-06 – 2021-04-07 (×2): 60 mg via ORAL
  Filled 2021-04-05 (×2): qty 1

## 2021-04-05 MED ORDER — VERAPAMIL HCL 2.5 MG/ML IV SOLN
INTRAVENOUS | Status: DC | PRN
  Administered 2021-04-05: 10 mL via INTRA_ARTERIAL

## 2021-04-05 MED ORDER — NITROGLYCERIN IN D5W 200-5 MCG/ML-% IV SOLN
0.0000 ug/min | INTRAVENOUS | Status: DC
Start: 2021-04-05 — End: 2021-04-07
  Administered 2021-04-05: 40 ug/min via INTRAVENOUS

## 2021-04-05 MED ORDER — FENTANYL CITRATE (PF) 100 MCG/2ML IJ SOLN
INTRAMUSCULAR | Status: DC | PRN
  Administered 2021-04-05 (×3): 25 ug via INTRAVENOUS

## 2021-04-05 MED ORDER — SODIUM CHLORIDE 0.9 % IV SOLN
INTRAVENOUS | Status: AC | PRN
  Administered 2021-04-05: 10 mL/h via INTRAVENOUS

## 2021-04-05 MED ORDER — METFORMIN HCL 500 MG PO TABS
500.0000 mg | ORAL_TABLET | Freq: Every day | ORAL | Status: DC
  Administered 2021-04-06: 500 mg via ORAL
  Filled 2021-04-05: qty 1

## 2021-04-05 MED ORDER — GUAIFENESIN-DM 100-10 MG/5ML PO SYRP: 10.0000 mL | ORAL_SOLUTION | ORAL | Status: DC | PRN

## 2021-04-05 MED ORDER — SODIUM CHLORIDE 0.9% FLUSH
10.0000 mL | Freq: Two times a day (BID) | INTRAVENOUS | Status: DC
  Administered 2021-04-05 – 2021-04-06 (×3): 10 mL

## 2021-04-05 MED ORDER — MIDAZOLAM HCL 2 MG/2ML IJ SOLN
INTRAMUSCULAR | Status: AC
  Filled 2021-04-05: qty 2

## 2021-04-05 MED ORDER — LEVOTHYROXINE SODIUM 50 MCG PO TABS
50.0000 ug | ORAL_TABLET | Freq: Every day | ORAL | Status: DC
  Administered 2021-04-06 – 2021-04-07 (×2): 50 ug via ORAL
  Filled 2021-04-05 (×2): qty 1

## 2021-04-05 MED ORDER — FENTANYL CITRATE (PF) 100 MCG/2ML IJ SOLN
INTRAMUSCULAR | Status: AC
  Filled 2021-04-05: qty 2

## 2021-04-05 MED ORDER — DOCUSATE SODIUM 100 MG PO CAPS: 100.0000 mg | ORAL_CAPSULE | Freq: Two times a day (BID) | ORAL | Status: DC

## 2021-04-05 MED ORDER — ACETAMINOPHEN 325 MG PO TABS
650.0000 mg | ORAL_TABLET | ORAL | Status: DC | PRN
  Administered 2021-04-06: 650 mg via ORAL
  Filled 2021-04-05: qty 2

## 2021-04-05 MED ORDER — LEVALBUTEROL HCL 0.63 MG/3ML IN NEBU
0.6300 mg | INHALATION_SOLUTION | Freq: Once | RESPIRATORY_TRACT | Status: AC
  Administered 2021-04-05: 0.63 mg via RESPIRATORY_TRACT
  Filled 2021-04-05: qty 3

## 2021-04-05 MED ORDER — HEPARIN SODIUM (PORCINE) 5000 UNIT/ML IJ SOLN
5000.0000 [IU] | Freq: Three times a day (TID) | INTRAMUSCULAR | Status: DC
  Administered 2021-04-05 – 2021-04-07 (×6): 5000 [IU] via SUBCUTANEOUS
  Filled 2021-04-05 (×6): qty 1

## 2021-04-05 MED ORDER — IOHEXOL 350 MG/ML SOLN
INTRAVENOUS | Status: DC | PRN
  Administered 2021-04-05: 180 mL

## 2021-04-05 MED ORDER — ONDANSETRON HCL 4 MG/2ML IJ SOLN: 4.0000 mg | Freq: Four times a day (QID) | INTRAMUSCULAR | Status: DC | PRN

## 2021-04-05 MED ORDER — AMLODIPINE BESYLATE 5 MG PO TABS: 5.0000 mg | ORAL_TABLET | Freq: Every day | ORAL | Status: DC

## 2021-04-05 MED ORDER — TICAGRELOR 90 MG PO TABS
ORAL_TABLET | ORAL | Status: DC | PRN
  Administered 2021-04-05: 180 mg via ORAL

## 2021-04-05 MED ORDER — PREDNISONE 20 MG PO TABS: 50.0000 mg | ORAL_TABLET | Freq: Every day | ORAL | Status: DC

## 2021-04-05 MED ORDER — MIDAZOLAM HCL 2 MG/2ML IJ SOLN
INTRAMUSCULAR | Status: DC | PRN
  Administered 2021-04-05 (×4): 1 mg via INTRAVENOUS

## 2021-04-05 MED ORDER — ACETAMINOPHEN 325 MG PO TABS: 650.0000 mg | ORAL_TABLET | Freq: Four times a day (QID) | ORAL | Status: DC | PRN

## 2021-04-05 MED ORDER — LORATADINE 10 MG PO TABS
10.0000 mg | ORAL_TABLET | Freq: Every day | ORAL | Status: DC
  Administered 2021-04-06 – 2021-04-07 (×2): 10 mg via ORAL
  Filled 2021-04-05 (×2): qty 1

## 2021-04-05 MED ORDER — NITROGLYCERIN IN D5W 200-5 MCG/ML-% IV SOLN
INTRAVENOUS | Status: AC | PRN
  Administered 2021-04-05: 25 ug/min via INTRAVENOUS

## 2021-04-05 MED ORDER — POTASSIUM CHLORIDE ER 10 MEQ PO TBCR: 10.0000 meq | EXTENDED_RELEASE_TABLET | Freq: Every day | ORAL | Status: DC

## 2021-04-05 MED ORDER — FUROSEMIDE 10 MG/ML IJ SOLN
40.0000 mg | Freq: Two times a day (BID) | INTRAMUSCULAR | Status: DC
  Administered 2021-04-05 – 2021-04-06 (×2): 40 mg via INTRAVENOUS
  Filled 2021-04-05 (×2): qty 4

## 2021-04-05 MED ORDER — HEPARIN SODIUM (PORCINE) 1000 UNIT/ML IJ SOLN
INTRAMUSCULAR | Status: DC | PRN
  Administered 2021-04-05 (×2): 5000 [IU] via INTRAVENOUS
  Administered 2021-04-05: 2000 [IU] via INTRAVENOUS
  Administered 2021-04-05: 5000 [IU] via INTRAVENOUS

## 2021-04-05 MED ORDER — SODIUM CHLORIDE 0.9% FLUSH: 3.0000 mL | INTRAVENOUS | Status: DC | PRN

## 2021-04-05 MED ORDER — ALBUTEROL SULFATE (2.5 MG/3ML) 0.083% IN NEBU
2.5000 mg | INHALATION_SOLUTION | Freq: Four times a day (QID) | RESPIRATORY_TRACT | Status: DC | PRN
  Administered 2021-04-06: 2.5 mg via RESPIRATORY_TRACT
  Filled 2021-04-05: qty 3

## 2021-04-05 MED ORDER — LABETALOL HCL 5 MG/ML IV SOLN: 10.0000 mg | INTRAVENOUS | Status: AC | PRN

## 2021-04-05 MED ORDER — EZETIMIBE 10 MG PO TABS
10.0000 mg | ORAL_TABLET | Freq: Every day | ORAL | Status: DC
  Administered 2021-04-06 – 2021-04-07 (×2): 10 mg via ORAL
  Filled 2021-04-05 (×2): qty 1

## 2021-04-05 SURGICAL SUPPLY — 17 items
BALLN SAPPHIRE 3.0X12 (BALLOONS) ×2
BALLN SAPPHIRE ~~LOC~~ 4.0X10 (BALLOONS) ×1 IMPLANT
BALLOON SAPPHIRE 3.0X12 (BALLOONS) IMPLANT
CATH DIAG 6FR JR4 (CATHETERS) ×1 IMPLANT
CATH DIAG 6FR PIGTAIL ANGLED (CATHETERS) ×1 IMPLANT
CATH LAUNCHER 6FR EBU3.5 (CATHETERS) ×1 IMPLANT
DEVICE RAD COMP TR BAND LRG (VASCULAR PRODUCTS) ×1 IMPLANT
GLIDESHEATH SLEND SS 6F .021 (SHEATH) ×1 IMPLANT
GUIDELINER 6F (CATHETERS) ×1 IMPLANT
KIT ENCORE 26 ADVANTAGE (KITS) ×1 IMPLANT
KIT HEMO VALVE WATCHDOG (MISCELLANEOUS) ×1 IMPLANT
SHEATH PROBE COVER 6X72 (BAG) ×1 IMPLANT
STENT ONYX FRONTIER 4.0X15 (Permanent Stent) ×1 IMPLANT
WIRE ASAHI PROWATER 180CM (WIRE) ×1 IMPLANT
WIRE EMERALD 3MM-J .035X150CM (WIRE) ×1 IMPLANT
WIRE EMERALD 3MM-J .035X260CM (WIRE) ×1 IMPLANT
WIRE MICRO SET SILHO 5FR 7 (SHEATH) ×1 IMPLANT

## 2021-04-05 NOTE — Plan of Care (Signed)
  Problem: Education: Goal: Understanding of cardiac disease, CV risk reduction, and recovery process will improve Outcome: Not Progressing Goal: Understanding of medication regimen will improve Outcome: Not Progressing   Problem: Activity: Goal: Ability to tolerate increased activity will improve Outcome: Not Progressing

## 2021-04-05 NOTE — H&P (Signed)
Cardiology Admission History and Physical:   Patient ID: [facility] MRN: [id number]; DOB: Mar 19, 1956   Admission date: 04/05/2021  PCP:  Elmer Picker, [facility] Providers Cardiologist:  Dina Rich, MD        Chief Complaint:  Chest pain  Patient Profile:   [facility] is a 65 y.o. female with history of ongoing tobacco abuse, hypertension, fibromyalgia, and family history of early myocardial infarction who is being seen 04/05/2021 for the evaluation of chest pain.   History of Present Illness:   [facility] is a 65 y.o. female with history of ongoing tobacco abuse, hypertension, fibromyalgia, morbid obesity, and family history of early myocardial infarction who is being seen 04/05/2021 for the evaluation of chest pain. The patient tells me she was in her normal state of health up until today.  She was in her kitchen when she developed acute onset chest pain that she rated at 9-10 out of 10.  This associated with diaphoresis and nausea with mild shortness of breath.  Because the pain did not get any better she called EMS.  An EKG demonstrated ST changes in leads I and aVL with a T wave inversion in lead III.  Her second EKG was improved but did demonstrate a new Q wave in aVL with continued ST elevations.  She also had potentially has ST elevations in V6.  She still has ongoing chest pain at 6 out of 10.  She only received aspirin 324 mg as she has no IV access.  For this reason she was referred for urgent coronary angiography   Past Medical History:  Diagnosis Date   Acute respiratory failure (HCC)    Arthritis    Bronchitis    CHF (congestive heart failure) (HCC)    COPD with exacerbation (HCC)    Fibromyalgia    Headache(784.0)    Hypertension    Hypertensive urgency    Hyperthyroidism    MRSA infection    Pneumonia    Retinal detachment    Sciatic leg pain    Shortness of breath     Past Surgical History:  Procedure  Laterality Date   KNEE SURGERY     TUBAL LIGATION       Medications Prior to Admission: Prior to Admission medications   Medication Sig Start Date End Date Taking? Authorizing Provider  acetaminophen (TYLENOL) 325 MG tablet Take 2 tablets (650 mg total) by mouth every 6 (six) hours as needed for mild pain, fever or headache (or Fever >/= 101). 08/01/20   Shon Hale, MD  albuterol (PROVENTIL HFA;VENTOLIN HFA) 108 (90 BASE) MCG/ACT inhaler Inhale 2 puffs into the lungs every 6 (six) hours as needed for wheezing or shortness of breath. 07/11/13   Black, Lesle Chris, NP  albuterol (VENTOLIN HFA) 108 (90 Base) MCG/ACT inhaler Inhale 2 puffs into the lungs every 6 (six) hours as needed for wheezing or shortness of breath. 08/01/20   Shon Hale, MD  amLODipine (NORVASC) 5 MG tablet Take 1 tablet (5 mg total) by mouth daily. 08/01/20   Shon Hale, MD  aspirin EC 81 MG tablet Take 1 tablet (81 mg total) by mouth daily with breakfast. 08/01/20   Shon Hale, MD  budesonide-formoterol (SYMBICORT) 160-4.5 MCG/ACT inhaler Inhale 2 puffs into the lungs 2 (two) times daily. 08/01/20   Shon Hale, MD  cyclobenzaprine (FLEXERIL) 10 MG tablet Take 1 tablet (10 mg total) by mouth 2 (two) times daily as needed for muscle  spasms. 08/27/20   Carroll Sage, PA-C  docusate sodium (COLACE) 100 MG capsule Take 1 capsule (100 mg total) by mouth every 12 (twelve) hours. While on pain medication. Patient not taking: No sig reported 09/16/16   Mesner, Barbara Cower, MD  DULoxetine (CYMBALTA) 60 MG capsule Take 60 mg by mouth daily. Reported on 11/17/2015    [provider]  furosemide (LASIX) 40 MG tablet Take 1 tablet (40 mg total) by mouth 2 (two) times daily. 08/01/20   Shon Hale, MD  guaiFENesin-dextromethorphan (ROBITUSSIN DM) 100-10 MG/5ML syrup Take 10 mLs by mouth every 4 (four) hours as needed for cough. 08/01/20   Shon Hale, MD  ipratropium-albuterol (DUONEB) 0.5-2.5 (3) MG/3ML  SOLN Take 3 mLs by nebulization every 4 (four) hours as needed (wheezing). Patient not taking: Reported on 07/28/2020 09/24/15   Lavera Guise, MD  levothyroxine (SYNTHROID, LEVOTHROID) 50 MCG tablet Take 50 mcg by mouth daily before breakfast. Reported on 11/17/2015    [provider]  loratadine (CLARITIN) 10 MG tablet Take 10 mg by mouth daily.    [provider]  Melatonin 10 MG CAPS Take 1 capsule by mouth at bedtime.    [provider]  meloxicam (MOBIC) 7.5 MG tablet Take 1 tablet (7.5 mg total) by mouth daily. Patient not taking: No sig reported 02/01/17   Robinson, [place] N, PA-C  metFORMIN (GLUCOPHAGE) 500 MG tablet Take 1 tablet (500 mg total) by mouth daily with breakfast. 08/01/20   Shon Hale, MD  oxyCODONE-acetaminophen (PERCOCET/ROXICET) 5-325 MG tablet Take 1-2 tablets by mouth every 8 (eight) hours as needed for severe pain. Patient not taking: No sig reported 09/16/16   Mesner, Barbara Cower, MD  potassium chloride (KLOR-CON) 10 MEQ tablet Take 1 tablet (10 mEq total) by mouth daily. Take While taking Lasix/furosemide 08/01/20   Shon Hale, MD  predniSONE (DELTASONE) 50 MG tablet Take 1 tablet (50 mg total) by mouth daily with breakfast. 08/02/20   Emokpae, Courage, MD  ZETIA 10 MG tablet Take 1 tablet by mouth daily. 09/02/16   [provider]     Allergies:    Allergies  Allergen Reactions   Bee Venom Swelling    Social History:   Social History   Socioeconomic History   Marital status: Legally Separated    Spouse name: Not on file   Number of children: Not on file   Years of education: Not on file   Highest education level: Not on file  Occupational History   Not on file  Tobacco Use   Smoking status: Some Days    Packs/day: 0.50    Years: 36.00    Pack years: 18.00    Types: Cigarettes    Start date: 06/04/1971   Smokeless tobacco: Never  Substance and Sexual Activity   Alcohol use: Yes    Comment: occ-wine   Drug use: No    Sexual activity: Yes    Birth control/protection: Surgical  Other Topics Concern   Not on file  Social History Narrative   Not on file   Social Determinants of Health   Financial Resource Strain: Not on file  Food Insecurity: Not on file  Transportation Needs: Not on file  Physical Activity: Not on file  Stress: Not on file  Social Connections: Not on file  Intimate Partner Violence: Not on file    Family History:     The patient's family history includes Diabetes in her mother and sister; Heart disease in her brother and mother.  ROS:  Please see the history of present illness.  All other ROS reviewed and negative.     Physical Exam/Data:  There were no vitals filed for this visit. No intake or output data in the 24 hours ending 04/05/21 1447 Last 3 Weights 08/01/2020 07/31/2020 07/30/2020  Weight (lbs) 315 lb 9.6 oz 318 lb 324 lb 1.2 oz  Weight (kg) 143.155 kg 144.244 kg 147 kg     There is no height or weight on file to calculate BMI.  General:  Well nourished, well developed, obese  HEENT: normal  Neck: noJVD Vascular: No carotid bruits; Distal pulses 2+ bilaterally   Cardiac:  normal S1, S2; RRR; no murmur  Lungs:  clear to auscultation bilaterally, no wheezing, rhonchi or rales  Abd: soft, nontender, no hepatomegaly  Ext: no edema Musculoskeletal:  No deformities, BUE and BLE strength normal and equal Skin: warm and dry  Neuro:  CNs 2-12 intact, no focal abnormalities noted Psych:  Normal affect    EKG:  The ECG that was done was personally reviewed and demonstrates details as reported above  Relevant CV Studies:  Cheree Ditto from February 2022 demonstrates normal ejection fraction with mild aortic sclerosis.  Laboratory Data:  High Sensitivity Troponin:  No results for input(s): TROPONINIHS in the last 720 hours.    ChemistryNo results for input(s): NA, K, CL, CO2, GLUCOSE, BUN, CREATININE, CALCIUM, MG, GFRNONAA, GFRAA, ANIONGAP in the last 168 hours.  No  results for input(s): PROT, ALBUMIN, AST, ALT, ALKPHOS, BILITOT in the last 168 hours. Lipids No results for input(s): CHOL, TRIG, HDL, LABVLDL, LDLCALC, CHOLHDL in the last 168 hours. HematologyNo results for input(s): WBC, RBC, HGB, HCT, MCV, MCH, MCHC, RDW, PLT in the last 168 hours. Thyroid No results for input(s): TSH, FREET4 in the last 168 hours. BNPNo results for input(s): BNP, PROBNP in the last 168 hours.  DDimer No results for input(s): DDIMER in the last 168 hours.   Radiology/Studies:  No results found.   Assessment and Plan:   Chest pain.  The patient has ongoing chest pain and though her EKG changes do not meet criteria we will refer her for emergent invasive assessment.  Further recommendations to follow results of this testing.  Given her lack of IV access and her body habitus I will place an IJ central line.   Risk Assessment/Risk Scores:    TIMI Risk Score for Unstable Angina or Non-ST Elevation MI:   The patient's TIMI risk score is 5, which indicates a 26% risk of all cause mortality, new or recurrent myocardial infarction or need for urgent revascularization in the next 14 days.       Severity of Illness: The appropriate patient status for this patient is INPATIENT. Inpatient status is judged to be reasonable and necessary in order to provide the required intensity of service to ensure the patient's safety. The patient's presenting symptoms, physical exam findings, and initial radiographic and laboratory data in the context of their chronic comorbidities is felt to place them at high risk for further clinical deterioration. Furthermore, it is not anticipated that the patient will be medically stable for discharge from the hospital within 2 midnights of admission.   * I certify that at the point of admission it is my clinical judgment that the patient will require inpatient hospital care spanning beyond 2 midnights from the point of admission due to high intensity of  service, high risk for further deterioration and high frequency of surveillance required.*   For  questions or updates, please contact [facility] Please consult [email or web address] for contact info under     Signed, Orbie Pyo, MD  04/05/2021 2:47 PM

## 2021-04-05 NOTE — Progress Notes (Addendum)
Pt brought to Cath Lab holding, Bay 6, connected to monitor, Nitroglycerin gtt infusing at 64mcg/min into right neck, safety maintained, no c/o pain at this time

## 2021-04-06 ENCOUNTER — Other Ambulatory Visit (HOSPITAL_COMMUNITY): Payer: Self-pay

## 2021-04-06 ENCOUNTER — Encounter (HOSPITAL_COMMUNITY): Payer: Self-pay | Admitting: Internal Medicine

## 2021-04-06 ENCOUNTER — Inpatient Hospital Stay (HOSPITAL_COMMUNITY): Payer: Medicare HMO

## 2021-04-06 DIAGNOSIS — I5031 Acute diastolic (congestive) heart failure: Secondary | ICD-10-CM

## 2021-04-06 DIAGNOSIS — I214 Non-ST elevation (NSTEMI) myocardial infarction: Secondary | ICD-10-CM

## 2021-04-06 DIAGNOSIS — E782 Mixed hyperlipidemia: Secondary | ICD-10-CM

## 2021-04-06 DIAGNOSIS — R7303 Prediabetes: Secondary | ICD-10-CM

## 2021-04-06 DIAGNOSIS — R079 Chest pain, unspecified: Secondary | ICD-10-CM

## 2021-04-06 LAB — ECHOCARDIOGRAM COMPLETE
Area-P 1/2: 2.99 cm2
Calc EF: 46.1 %
Height: 60 in
MV VTI: 5.84 cm2
S' Lateral: 4.1 cm
Single Plane A2C EF: 41 %
Single Plane A4C EF: 49.8 %
Weight: 5308.68 oz

## 2021-04-06 LAB — CBC
HCT: 36.6 % (ref 36.0–46.0)
Hemoglobin: 11.7 g/dL — ABNORMAL LOW (ref 12.0–15.0)
MCH: 30.5 pg (ref 26.0–34.0)
MCHC: 32 g/dL (ref 30.0–36.0)
MCV: 95.3 fL (ref 80.0–100.0)
Platelets: 189 10*3/uL (ref 150–400)
RBC: 3.84 MIL/uL — ABNORMAL LOW (ref 3.87–5.11)
RDW: 14.7 % (ref 11.5–15.5)
WBC: 8 10*3/uL (ref 4.0–10.5)
nRBC: 0 % (ref 0.0–0.2)

## 2021-04-06 LAB — BASIC METABOLIC PANEL
Anion gap: 9 (ref 5–15)
BUN: 13 mg/dL (ref 8–23)
CO2: 30 mmol/L (ref 22–32)
Calcium: 8.8 mg/dL — ABNORMAL LOW (ref 8.9–10.3)
Chloride: 99 mmol/L (ref 98–111)
Creatinine, Ser: 1.11 mg/dL — ABNORMAL HIGH (ref 0.44–1.00)
GFR, Estimated: 55 mL/min — ABNORMAL LOW (ref >60)
Glucose, Bld: 122 mg/dL — ABNORMAL HIGH (ref 70–99)
Potassium: 3.6 mmol/L (ref 3.5–5.1)
Sodium: 138 mmol/L (ref 135–145)

## 2021-04-06 LAB — TSH: TSH: 2.835 u[IU]/mL (ref 0.350–4.500)

## 2021-04-06 MED ORDER — ATORVASTATIN CALCIUM 80 MG PO TABS
80.0000 mg | ORAL_TABLET | Freq: Every day | ORAL | Status: DC
  Administered 2021-04-06 – 2021-04-07 (×2): 80 mg via ORAL
  Filled 2021-04-06 (×2): qty 1

## 2021-04-06 MED ORDER — PERFLUTREN LIPID MICROSPHERE
1.0000 mL | INTRAVENOUS | Status: AC | PRN
  Administered 2021-04-06: 5 mL via INTRAVENOUS
  Filled 2021-04-06: qty 10

## 2021-04-06 MED ORDER — CHLORHEXIDINE GLUCONATE CLOTH 2 % EX PADS
6.0000 | MEDICATED_PAD | Freq: Every day | CUTANEOUS | Status: DC
  Administered 2021-04-06: 6 via TOPICAL

## 2021-04-06 MED ORDER — DICLOFENAC SODIUM 1 % EX GEL
2.0000 g | Freq: Four times a day (QID) | CUTANEOUS | Status: DC | PRN
  Administered 2021-04-06 (×3): 2 g via TOPICAL
  Filled 2021-04-06: qty 100

## 2021-04-06 MED ORDER — CARVEDILOL 6.25 MG PO TABS
6.2500 mg | ORAL_TABLET | Freq: Two times a day (BID) | ORAL | Status: DC
  Administered 2021-04-06 – 2021-04-07 (×3): 6.25 mg via ORAL
  Filled 2021-04-06 (×3): qty 1

## 2021-04-06 MED ORDER — DAPAGLIFLOZIN PROPANEDIOL 10 MG PO TABS
10.0000 mg | ORAL_TABLET | Freq: Every day | ORAL | Status: DC
  Administered 2021-04-06 – 2021-04-07 (×2): 10 mg via ORAL
  Filled 2021-04-06 (×2): qty 1

## 2021-04-06 MED ORDER — CYCLOBENZAPRINE HCL 10 MG PO TABS
10.0000 mg | ORAL_TABLET | Freq: Two times a day (BID) | ORAL | Status: DC | PRN
  Administered 2021-04-06 – 2021-04-07 (×2): 10 mg via ORAL
  Filled 2021-04-06 (×3): qty 1

## 2021-04-06 MED ORDER — POTASSIUM CHLORIDE CRYS ER 20 MEQ PO TBCR
40.0000 meq | EXTENDED_RELEASE_TABLET | Freq: Once | ORAL | Status: AC
  Administered 2021-04-06: 40 meq via ORAL
  Filled 2021-04-06: qty 2

## 2021-04-06 MED FILL — Lidocaine HCl Local Preservative Free (PF) Inj 1%: INTRAMUSCULAR | Qty: 30 | Status: AC

## 2021-04-06 NOTE — Progress Notes (Addendum)
Progress Note  Patient Name: Marie West Date of Encounter: 04/06/2021  [facility] Cardiologist: Dina Rich, MD   Subjective   Breathing better.  Was on Lasix in the past but this was stopped.  LVEDP 31 mm Hg yeserday at cath.   Inpatient Medications    Scheduled Meds:  aspirin  81 mg Oral Daily   Chlorhexidine Gluconate Cloth  6 each Topical Daily   DULoxetine  60 mg Oral Daily   ezetimibe  10 mg Oral Daily   furosemide  40 mg Intravenous BID   heparin  5,000 Units Subcutaneous Q8H   levothyroxine  50 mcg Oral Q0600   loratadine  10 mg Oral Daily   melatonin  10 mg Oral QHS   metFORMIN  500 mg Oral Q breakfast   mometasone-formoterol  2 puff Inhalation BID   sodium chloride flush  10-40 mL Intracatheter Q12H   sodium chloride flush  3 mL Intravenous Q12H   ticagrelor  90 mg Oral BID   Continuous Infusions:  sodium chloride     nitroGLYCERIN Stopped (04/06/21 0621)   PRN Meds: sodium chloride, acetaminophen, albuterol, diclofenac Sodium, guaiFENesin-dextromethorphan, ipratropium-albuterol, ondansetron (ZOFRAN) IV, oxyCODONE-acetaminophen, sodium chloride flush, sodium chloride flush   Vital Signs    Vitals:   04/06/21 0700 04/06/21 0723 04/06/21 0759 04/06/21 0803  BP: (!) 107/57     Pulse: 65     Resp: 10     Temp:  98.7 F (37.1 C)    TempSrc:  Oral    SpO2: 97%  98% 94%  Weight:      Height:        Intake/Output Summary (Last 24 hours) at 04/06/2021 0922 Last data filed at 04/06/2021 0820 Gross per 24 hour  Intake 715.18 ml  Output 3900 ml  Net -3184.82 ml   Last 3 Weights 04/05/2021 08/01/2020 07/31/2020  Weight (lbs) 331 lb 12.7 oz 315 lb 9.6 oz 318 lb  Weight (kg) 150.5 kg 143.155 kg 144.244 kg      Telemetry    NSR - Personally Reviewed  ECG    NSR, PRWP - Personally Reviewed  Physical Exam   GEN: No acute distress.   Neck: No JVD Cardiac: RRR, no murmurs, rubs, or gallops.  Respiratory: Clear to auscultation  bilaterally. GI: Soft, nontender, non-distended , obese MS: tr pretibial edema bilaterally; No deformity. Neuro:  Nonfocal  Psych: Normal affect   Labs    High Sensitivity Troponin:   Recent Labs  Lab 04/05/21 1500 04/05/21 1841  TROPONINIHS 491* 13,601*     Chemistry Recent Labs  Lab 04/05/21 1500 04/05/21 1503 04/05/21 1841 04/06/21 0508  NA 143 143  --  138  K 4.1 4.1  --  3.6  CL 106 104  --  99  CO2 27  --   --  30  GLUCOSE 121* 117*  --  122*  BUN 10 10  --  13  CREATININE 0.72 0.60 0.83 1.11*  CALCIUM 8.3*  --   --  8.8*  PROT 6.0*  --   --   --   ALBUMIN 3.3*  --   --   --   AST 16  --   --   --   ALT 18  --   --   --   ALKPHOS 63  --   --   --   BILITOT 0.4  --   --   --   GFRNONAA >60  --  >60 55*  ANIONGAP 10  --   --  9    Lipids  Recent Labs  Lab 04/05/21 1502  CHOL 204*  TRIG 158*  HDL 51  LDLCALC 121*  CHOLHDL 4.0    Hematology Recent Labs  Lab 04/05/21 1500 04/05/21 1503 04/05/21 1841 04/06/21 0508  WBC 7.9  --  9.0 8.0  RBC 4.03  --  4.30 3.84*  HGB 12.1 12.9 13.1 11.7*  HCT 39.1 38.0 41.0 36.6  MCV 97.0  --  95.3 95.3  MCH 30.0  --  30.5 30.5  MCHC 30.9  --  32.0 32.0  RDW 14.7  --  14.7 14.7  PLT 227  --  219 189   Thyroid No results for input(s): TSH, FREET4 in the last 168 hours.  BNPNo results for input(s): BNP, PROBNP in the last 168 hours.  DDimer No results for input(s): DDIMER in the last 168 hours.   Radiology    CARDIAC CATHETERIZATION  Result Date: 04/05/2021   1st Mrg lesion is 80% stenosed.   A drug-eluting stent was successfully placed using a STENT ONYX FRONTIER 4.0X15.   Post intervention, there is a 0% residual stenosis.   LV end diastolic pressure is severely elevated. 1.  High-grade lesion of proximal first obtuse marginal treated with 1 drug-eluting stent 2.  Elevated LVEDP of 35 mmHg; the patient received 40 mg of IV Lasix in the cardiac catheterization laboratory 3.  Normal LV function. Recommendation:  Dual antiplatelet therapy for 1 year and medical management for diastolic dysfunction; an SGLT2 inhibitor and spironolactone may be considered.  The patient will be aggressively diuresed.    Cardiac Studies   Normal EF 07/2020.  Patient Profile     65 y.o. female with elevated LVEDP and OM lesion  Assessment & Plan    CAD: s/p PCI of OM.  Increased LVEDP. ASA, Brilinta for DAPT. start carvedilolol 6.25 mg BID.  Aggressive second prevention.  Acute diastolic heart failure:  Diuresed well.  Hold Lasix given mild bump in Cr.  Will need oral Lasix at home.   Hyperlipidemia: High dose statin.   PreDM/obesity: Healthy diet.  Already on metformin ( holding today).  High fiber, plant based diet.   Transfer to tele. Plan for discharge possibly tomorrow depending on how she does with cardiac rehab.      For questions or updates, please contact [facility] Please consult [email or web address] for contact info under        Signed, Lance Muss, MD  04/06/2021, 9:22 AM

## 2021-04-06 NOTE — TOC Benefit Eligibility Note (Signed)
Patient Product/process development scientist completed.    The patient is currently admitted and upon discharge could be taking Brilinta 90 mg.  The current 30 day co-pay is, $4.00.   The patient is currently admitted and upon discharge could be taking Jardiance 10 mg.  The current 30 day co-pay is, $4.00.   The patient is currently admitted and upon discharge could be taking Farxiga 10 mg.  The current 30 day co-pay is, $4.00.   The patient is insured through [employer] Medicare Part D     Roland Earl, CPhT Pharmacy Patient Advocate Specialist [facility] Antimicrobial Stewardship Team Direct Number: [phone]  Fax: [phone]

## 2021-04-06 NOTE — Progress Notes (Signed)
   04/06/21 1350  Clinical Encounter Type  Visited With Patient  Visit Type Spiritual support;Follow-up  Referral From Chaplain Terance Ice)  Consult/Referral To Chaplain   Chaplain rounding on unit. The patient is resting and chaplain prayed at the bedside. This note was prepared by Deneen Harts, M.Div..  For questions please contact by phone [phone].

## 2021-04-06 NOTE — Progress Notes (Addendum)
CARDIAC REHAB PHASE I   PRE:  Rate/Rhythm: 73 SR    BP: sitting 116/82    SaO2: 95 4L  MODE:  Ambulation: 40 ft   POST:  Rate/Rhythm: 96 SR    BP: sitting 145/92     SaO2: 95 4L  Pt ambulated with Carley Hammed due to orthopedic issues and need for O2. C/o sciatic pain immediately with standing. Walked length of room then 20 ft in hall but starting to limp so turned around. Pt c/o increased SOB with ambulation. Sts she normally doesn't get SOB unless she walks to mailbox at home. She previously was working, retired 2 weeks ago.   Discussed MI, stent, Brilinta, smoking cessation, diet (carbs and sodium), NTG, and CRPII with pt and family. She voices desire to quit smoking. Her significant other does smoke as well. Will refer to [facility] CRPII. Pt had an appointment for an MRI for her hip next week. At this point she needs recumbent exercise.   Decreased O2 to 2L in recliner. RN aware.  [id number]   Harriet Masson CES, ACSM 04/06/2021 2:37 PM

## 2021-04-07 ENCOUNTER — Other Ambulatory Visit (HOSPITAL_COMMUNITY): Payer: Self-pay

## 2021-04-07 DIAGNOSIS — I5033 Acute on chronic diastolic (congestive) heart failure: Secondary | ICD-10-CM

## 2021-04-07 DIAGNOSIS — I10 Essential (primary) hypertension: Secondary | ICD-10-CM

## 2021-04-07 DIAGNOSIS — Z720 Tobacco use: Secondary | ICD-10-CM

## 2021-04-07 LAB — BASIC METABOLIC PANEL
Anion gap: 10 (ref 5–15)
BUN: 16 mg/dL (ref 8–23)
CO2: 31 mmol/L (ref 22–32)
Calcium: 8.9 mg/dL (ref 8.9–10.3)
Chloride: 95 mmol/L — ABNORMAL LOW (ref 98–111)
Creatinine, Ser: 0.93 mg/dL (ref 0.44–1.00)
GFR, Estimated: >60 mL/min (ref >60)
Glucose, Bld: 116 mg/dL — ABNORMAL HIGH (ref 70–99)
Potassium: 4 mmol/L (ref 3.5–5.1)
Sodium: 136 mmol/L (ref 135–145)

## 2021-04-07 MED ORDER — SPIRONOLACTONE 25 MG PO TABS
12.5000 mg | ORAL_TABLET | Freq: Every day | ORAL | 0 refills | Status: DC
  Filled 2021-04-07: qty 45, 90d supply, fill #0

## 2021-04-07 MED ORDER — METFORMIN HCL 500 MG PO TABS: 500.0000 mg | ORAL_TABLET | Freq: Every day | ORAL | Status: DC

## 2021-04-07 MED ORDER — FUROSEMIDE 40 MG PO TABS
40.0000 mg | ORAL_TABLET | Freq: Every day | ORAL | 0 refills | Status: DC
  Filled 2021-04-07: qty 90, 90d supply, fill #0

## 2021-04-07 MED ORDER — SPIRONOLACTONE 12.5 MG HALF TABLET
12.5000 mg | ORAL_TABLET | Freq: Every day | ORAL | Status: DC
  Administered 2021-04-07: 12.5 mg via ORAL
  Filled 2021-04-07: qty 1

## 2021-04-07 MED ORDER — TICAGRELOR 90 MG PO TABS
90.0000 mg | ORAL_TABLET | Freq: Two times a day (BID) | ORAL | 1 refills | Status: DC
  Filled 2021-04-07: qty 60, 30d supply, fill #0

## 2021-04-07 MED ORDER — CARVEDILOL 6.25 MG PO TABS
6.2500 mg | ORAL_TABLET | Freq: Two times a day (BID) | ORAL | 1 refills | Status: DC
  Filled 2021-04-07: qty 180, 90d supply, fill #0

## 2021-04-07 MED ORDER — FUROSEMIDE 10 MG/ML IJ SOLN
40.0000 mg | Freq: Once | INTRAMUSCULAR | Status: AC
  Administered 2021-04-07: 40 mg via INTRAVENOUS
  Filled 2021-04-07: qty 4

## 2021-04-07 MED ORDER — ATORVASTATIN CALCIUM 80 MG PO TABS
80.0000 mg | ORAL_TABLET | Freq: Every day | ORAL | 1 refills | Status: DC
  Filled 2021-04-07: qty 90, 90d supply, fill #0

## 2021-04-07 MED ORDER — DAPAGLIFLOZIN PROPANEDIOL 10 MG PO TABS
10.0000 mg | ORAL_TABLET | Freq: Every day | ORAL | 1 refills | Status: DC
  Filled 2021-04-07: qty 90, 90d supply, fill #0

## 2021-04-07 NOTE — Progress Notes (Signed)
Progress Note  Patient Name: Marie West Date of Encounter: 04/07/2021  [facility] Cardiologist: Dina Rich, MD   Subjective   Still has some shortness of breath.  Feels that she is close o baseline and wants to go home.  No supplemental O2 use at home.   Inpatient Medications    Scheduled Meds:  aspirin  81 mg Oral Daily   atorvastatin  80 mg Oral Daily   carvedilol  6.25 mg Oral BID WC   Chlorhexidine Gluconate Cloth  6 each Topical Daily   dapagliflozin propanediol  10 mg Oral Daily   DULoxetine  60 mg Oral Daily   ezetimibe  10 mg Oral Daily   heparin  5,000 Units Subcutaneous Q8H   levothyroxine  50 mcg Oral Q0600   loratadine  10 mg Oral Daily   melatonin  10 mg Oral QHS   mometasone-formoterol  2 puff Inhalation BID   sodium chloride flush  10-40 mL Intracatheter Q12H   sodium chloride flush  3 mL Intravenous Q12H   ticagrelor  90 mg Oral BID   Continuous Infusions:  sodium chloride     nitroGLYCERIN Stopped (04/06/21 0621)   PRN Meds: sodium chloride, acetaminophen, albuterol, cyclobenzaprine, diclofenac Sodium, guaiFENesin-dextromethorphan, ipratropium-albuterol, ondansetron (ZOFRAN) IV, oxyCODONE-acetaminophen, sodium chloride flush, sodium chloride flush   Vital Signs    Vitals:   04/07/21 0300 04/07/21 0400 04/07/21 0749 04/07/21 0811  BP:  118/72  131/80  Pulse: 70 68  83  Resp: 16 15    Temp: 97.9 F (36.6 C)  98.5 F (36.9 C)   TempSrc: Oral  Oral   SpO2: 90% 97%    Weight:      Height:        Intake/Output Summary (Last 24 hours) at 04/07/2021 0822 Last data filed at 04/07/2021 0750 Gross per 24 hour  Intake 1080 ml  Output 2750 ml  Net -1670 ml   Last 3 Weights 04/05/2021 08/01/2020 07/31/2020  Weight (lbs) 331 lb 12.7 oz 315 lb 9.6 oz 318 lb  Weight (kg) 150.5 kg 143.155 kg 144.244 kg      Telemetry    NSR - Personally Reviewed  ECG    NSR - Personally Reviewed  Physical Exam   GEN: No acute distress.    Neck: No JVD Cardiac: RRR, no murmurs, rubs, or gallops.  Respiratory: wheezing to auscultation bilaterally. GI: Soft, nontender, non-distended , obese MS: No edema; No deformity. Neuro:  Nonfocal  Psych: Normal affect   Labs    High Sensitivity Troponin:   Recent Labs  Lab 04/05/21 1500 04/05/21 1841  TROPONINIHS 491* 13,601*     Chemistry Recent Labs  Lab 04/05/21 1500 04/05/21 1503 04/05/21 1841 04/06/21 0508 04/07/21 0616  NA 143 143  --  138 136  K 4.1 4.1  --  3.6 4.0  CL 106 104  --  99 95*  CO2 27  --   --  30 31  GLUCOSE 121* 117*  --  122* 116*  BUN 10 10  --  13 16  CREATININE 0.72 0.60 0.83 1.11* 0.93  CALCIUM 8.3*  --   --  8.8* 8.9  PROT 6.0*  --   --   --   --   ALBUMIN 3.3*  --   --   --   --   AST 16  --   --   --   --   ALT 18  --   --   --   --  ALKPHOS 63  --   --   --   --   BILITOT 0.4  --   --   --   --   GFRNONAA >60  --  >60 55* >60  ANIONGAP 10  --   --  9 10    Lipids  Recent Labs  Lab 04/05/21 1502  CHOL 204*  TRIG 158*  HDL 51  LDLCALC 121*  CHOLHDL 4.0    Hematology Recent Labs  Lab 04/05/21 1500 04/05/21 1503 04/05/21 1841 04/06/21 0508  WBC 7.9  --  9.0 8.0  RBC 4.03  --  4.30 3.84*  HGB 12.1 12.9 13.1 11.7*  HCT 39.1 38.0 41.0 36.6  MCV 97.0  --  95.3 95.3  MCH 30.0  --  30.5 30.5  MCHC 30.9  --  32.0 32.0  RDW 14.7  --  14.7 14.7  PLT 227  --  219 189   Thyroid  Recent Labs  Lab 04/06/21 1026  TSH 2.835    BNPNo results for input(s): BNP, PROBNP in the last 168 hours.  DDimer No results for input(s): DDIMER in the last 168 hours.   Radiology    CARDIAC CATHETERIZATION  Result Date: 04/05/2021   1st Mrg lesion is 80% stenosed.   A drug-eluting stent was successfully placed using a STENT ONYX FRONTIER 4.0X15.   Post intervention, there is a 0% residual stenosis.   LV end diastolic pressure is severely elevated. 1.  High-grade lesion of proximal first obtuse marginal treated with 1 drug-eluting stent  2.  Elevated LVEDP of 35 mmHg; the patient received 40 mg of IV Lasix in the cardiac catheterization laboratory 3.  Normal LV function. Recommendation: Dual antiplatelet therapy for 1 year and medical management for diastolic dysfunction; an SGLT2 inhibitor and spironolactone may be considered.  The patient will be aggressively diuresed.   ECHOCARDIOGRAM COMPLETE  Result Date: 04/06/2021    ECHOCARDIOGRAM REPORT   Patient Name:   Marie West Date of Exam: 04/06/2021 Medical Rec #:  [id number]       Height:       60.0 in Accession #:    [id number]      Weight:       331.8 lb Date of Birth:  1955/08/21      BSA:          2.315 m Patient Age:    65 years        BP:           107/57 mmHg Patient Gender: F               HR:           68 bpm. Exam Location:  Inpatient Procedure: 2D Echo, Cardiac Doppler and Color Doppler Indications:     Chest Pain  History:         Patient has prior history of Echocardiogram examinations, most                  recent 07/30/2020. Risk Factors:Hypertension and Diabetes.  Sonographer:     Thelma Barge Referring Phys:  [id number] Orbie Pyo Diagnosing Phys: Wilfred Lacy  Sonographer Comments: Image acquisition challenging due to patient body habitus. IMPRESSIONS  1. Left ventricular ejection fraction, by estimation, is 40 to 45%. The left ventricle has mildly decreased function. The left ventricle demonstrates global hypokinesis. Very difficult images even with Definity, but I get the impression that LV systolic  function is reduced somewhat beyond what  would be expected with the described coronary disease. Would consider followup imaging study via cardiac MRI as this may allow better images and more accurate quantification. There is mild left ventricular hypertrophy. Left ventricular diastolic parameters are consistent with Grade I diastolic dysfunction (impaired relaxation).  2. Right ventricular systolic function is normal. The right ventricular size is not well visualized.  Tricuspid regurgitation signal is inadequate for assessing PA pressure.  3. The mitral valve is normal in structure. Trivial mitral valve regurgitation. No evidence of mitral stenosis.  4. The aortic valve is tricuspid. Aortic valve regurgitation is not visualized. Mild aortic valve sclerosis is present, with no evidence of aortic valve stenosis.  5. Aortic dilatation noted. There is borderline dilatation of the ascending aorta, measuring 37 mm.  6. The inferior vena cava is normal in size with <50% respiratory variability, suggesting right atrial pressure of 8 mmHg.  7. Technically difficult study with poor acoustic windows. FINDINGS  Left Ventricle: Left ventricular ejection fraction, by estimation, is 40 to 45%. The left ventricle has mildly decreased function. The left ventricle demonstrates global hypokinesis. Definity contrast agent was given IV to delineate the left ventricular  endocardial borders. The left ventricular internal cavity size was normal in size. There is mild left ventricular hypertrophy. Left ventricular diastolic parameters are consistent with Grade I diastolic dysfunction (impaired relaxation). Right Ventricle: The right ventricular size is not well visualized. No increase in right ventricular wall thickness. Right ventricular systolic function is normal. Tricuspid regurgitation signal is inadequate for assessing PA pressure. Left Atrium: Left atrial size was normal in size. Right Atrium: Right atrial size was normal in size. Pericardium: Trivial pericardial effusion is present. Mitral Valve: The mitral valve is normal in structure. Trivial mitral valve regurgitation. No evidence of mitral valve stenosis. MV peak gradient, 3.9 mmHg. The mean mitral valve gradient is 1.0 mmHg. Tricuspid Valve: The tricuspid valve is normal in structure. Tricuspid valve regurgitation is not demonstrated. Aortic Valve: The aortic valve is tricuspid. Aortic valve regurgitation is not visualized. Mild aortic  valve sclerosis is present, with no evidence of aortic valve stenosis. Pulmonic Valve: The pulmonic valve was normal in structure. Pulmonic valve regurgitation is not visualized. Aorta: Aortic dilatation noted. There is borderline dilatation of the ascending aorta, measuring 37 mm. Venous: The inferior vena cava is normal in size with less than 50% respiratory variability, suggesting right atrial pressure of 8 mmHg. IAS/Shunts: No atrial level shunt detected by color flow Doppler.  LEFT VENTRICLE PLAX 2D LVIDd:         5.40 cm      Diastology LVIDs:         4.10 cm      LV e' medial:    5.87 cm/s LV PW:         1.30 cm      LV E/e' medial:  13.4 LV IVS:        1.30 cm      LV e' lateral:   5.66 cm/s LVOT diam:     3.20 cm      LV E/e' lateral: 13.9 LV SV:         182 LV SV Index:   79 LVOT Area:     8.04 cm  LV Volumes (MOD) LV vol d, MOD A2C: 144.0 ml LV vol d, MOD A4C: 205.0 ml LV vol s, MOD A2C: 84.9 ml LV vol s, MOD A4C: 103.0 ml LV SV MOD A2C:     59.1 ml LV SV  MOD A4C:     205.0 ml LV SV MOD BP:      82.0 ml RIGHT VENTRICLE             IVC RV S prime:     16.40 cm/s  IVC diam: 1.80 cm TAPSE (M-mode): 2.8 cm LEFT ATRIUM             Index        RIGHT ATRIUM           Index LA diam:        4.60 cm 1.99 cm/m   RA Area:     15.90 cm LA Vol (A2C):   35.0 ml 15.12 ml/m  RA Volume:   42.00 ml  18.15 ml/m LA Vol (A4C):   37.9 ml 16.37 ml/m LA Biplane Vol: 37.9 ml 16.37 ml/m  AORTIC VALVE LVOT Vmax:   102.00 cm/s LVOT Vmean:  65.600 cm/s LVOT VTI:    0.226 m  AORTA Ao Root diam: 3.70 cm Ao Asc diam:  3.70 cm MITRAL VALVE MV Area (PHT): 2.99 cm    SHUNTS MV Area VTI:   5.84 cm    Systemic VTI:  0.23 m MV Peak grad:  3.9 mmHg    Systemic Diam: 3.20 cm MV Mean grad:  1.0 mmHg MV Vmax:       0.99 m/s MV Vmean:      56.0 cm/s MV Decel Time: 254 msec MV E velocity: 78.40 cm/s MV A velocity: 94.70 cm/s MV E/A ratio:  0.83 Dalton McleanMD Electronically signed by Wilfred Lacy Signature Date/Time: 04/06/2021/4:11:49  PM    Final     Cardiac Studies   EF 40-45% by echo  Patient Profile     65 y.o. female with volume overload and OM stent  Assessment & Plan    NSTEMI/CAD: s/p OM PCI.  Neds DAPT, high dose statin.  Acute on Chronic diastolic heart failure: Given elevated LVEDP, she will need some Lasix at home.  Was not taking any.  Will start with Lasix 40 mg daily with an option for a second dose in the afternoon if she has signs of volume overload.  Cr decreased to < 1 from yesterday's reading.  Also adding low dose spironolactone 12.5 mg daily. Needs BMet at f/u.   Hyperlipidemia: PLant based diet with statin.   She needs to stop smoking.   PreDM/obesity: restart metformin as Cr improved. On SGLT2 inhibitor.  Recumbent exercise recommended per cardiac rehab.  Will try to arrange discharge with home health.       For questions or updates, please contact [facility] Please consult [email or web address] for contact info under        Signed, Lance Muss, MD  04/07/2021, 8:22 AM

## 2021-04-07 NOTE — Discharge Instructions (Signed)

## 2021-04-07 NOTE — Discharge Summary (Addendum)
Discharge Summary    Patient ID: Marie West MRN: [id number]; DOB: 1956/01/08  Admit date: 04/05/2021 Discharge date: 04/07/2021  PCP:  Elmer Picker, [facility] Providers Cardiologist:  Dina Rich, MD   Discharge Diagnoses    Principal Problem:   Unstable angina pectoris [facility]) Active Problems:   Tobacco use   Hypertension   Non-insulin dependent type 2 diabetes mellitus (HCC)   Acute diastolic CHF (congestive heart failure) [facility])    Diagnostic Studies/Procedures    Cath: 04/05/2021    1st Mrg lesion is 80% stenosed.   A drug-eluting stent was successfully placed using a STENT ONYX FRONTIER 4.0X15.   Post intervention, there is a 0% residual stenosis.   LV end diastolic pressure is severely elevated.   1.  High-grade lesion of proximal first obtuse marginal treated with 1 drug-eluting stent 2.  Elevated LVEDP of 35 mmHg; the patient received 40 mg of IV Lasix in the cardiac catheterization laboratory 3.  Normal LV function.   Recommendation: Dual antiplatelet therapy for 1 year and medical management for diastolic dysfunction; an SGLT2 inhibitor and spironolactone may be considered.  The patient will be aggressively diuresed.  Diagnostic Dominance: Right Intervention    Echo: 04/06/2021  IMPRESSIONS     1. Left ventricular ejection fraction, by estimation, is 40 to 45%. The  left ventricle has mildly decreased function. The left ventricle  demonstrates global hypokinesis. Very difficult images even with Definity,  but I get the impression that LV systolic   function is reduced somewhat beyond what would be expected with the  described coronary disease. Would consider followup imaging study via  cardiac MRI as this may allow better images and more accurate  quantification. There is mild left ventricular  hypertrophy. Left ventricular diastolic parameters are consistent with  Grade I diastolic dysfunction (impaired  relaxation).   2. Right ventricular systolic function is normal. The right ventricular  size is not well visualized. Tricuspid regurgitation signal is inadequate  for assessing PA pressure.   3. The mitral valve is normal in structure. Trivial mitral valve  regurgitation. No evidence of mitral stenosis.   4. The aortic valve is tricuspid. Aortic valve regurgitation is not  visualized. Mild aortic valve sclerosis is present, with no evidence of  aortic valve stenosis.   5. Aortic dilatation noted. There is borderline dilatation of the  ascending aorta, measuring 37 mm.   6. The inferior vena cava is normal in size with <50% respiratory  variability, suggesting right atrial pressure of 8 mmHg.   7. Technically difficult study with poor acoustic windows.   FINDINGS   Left Ventricle: Left ventricular ejection fraction, by estimation, is 40  to 45%. The left ventricle has mildly decreased function. The left  ventricle demonstrates global hypokinesis. Definity contrast agent was  given IV to delineate the left ventricular   endocardial borders. The left ventricular internal cavity size was normal  in size. There is mild left ventricular hypertrophy. Left ventricular  diastolic parameters are consistent with Grade I diastolic dysfunction  (impaired relaxation).   Right Ventricle: The right ventricular size is not well visualized. No  increase in right ventricular wall thickness. Right ventricular systolic  function is normal. Tricuspid regurgitation signal is inadequate for  assessing PA pressure.   Left Atrium: Left atrial size was normal in size.   Right Atrium: Right atrial size was normal in size.   Pericardium: Trivial pericardial effusion is present.   Mitral Valve: The mitral  valve is normal in structure. Trivial mitral  valve regurgitation. No evidence of mitral valve stenosis. MV peak  gradient, 3.9 mmHg. The mean mitral valve gradient is 1.0 mmHg.   Tricuspid Valve: The  tricuspid valve is normal in structure. Tricuspid  valve regurgitation is not demonstrated.   Aortic Valve: The aortic valve is tricuspid. Aortic valve regurgitation is  not visualized. Mild aortic valve sclerosis is present, with no evidence  of aortic valve stenosis.   Pulmonic Valve: The pulmonic valve was normal in structure. Pulmonic valve  regurgitation is not visualized.   Aorta: Aortic dilatation noted. There is borderline dilatation of the  ascending aorta, measuring 37 mm.   Venous: The inferior vena cava is normal in size with less than 50%  respiratory variability, suggesting right atrial pressure of 8 mmHg.   IAS/Shunts: No atrial level shunt detected by color flow Doppler.  _____________   History of Present Illness     [name] is a 65 y.o. female with past medical history of tobacco use, hypertension, fibromyalgia and family history of early CAD who presented with chest pain.  Reported she was in her usual state of health up until the day of admission.  She was in her kitchen and developed acute onset of chest pain which she rated a 9/10.  This is associated with diaphoresis and nausea with mild shortness of breath.  Pain persisted and she called EMS.  EKG demonstrated ST changes in leads I and aVL with T wave inversion in lead III.  EKG repeated showed improvement but demonstrated Q waves in aVL with continued ST elevations.  She was given 324 of aspirin and on arrival taken to the Cath Lab urgently for cardiac catheterization.   Hospital Course     Non-STEMI: Underwent cardiac catheterization noted above with high-grade lesion of proximal first OM treated with PCI/DES 1.  Recommendations for DAPT with aspirin/Brilinta.  High-sensitivity troponin [place] at 136[address].  She was also started on high-dose statin as well as Coreg which was titrated to 6.25 mg twice daily.  Evaluated by cardiac rehab.  Recommendations for home health PT with orders placed at discharge. --  Continue on aspirin, Brilinta, Coreg, statin and Aldactone  HFrEF/ICM: Echo showed EF of 40 to 45% with global hypokinesis, grade 1 diastolic dysfunction.  LVEDP was elevated at 35 on cath.  She did receive IV Lasix, net -4.6 L. --We will continue on Coreg 6.25 mg twice daily, spironolactone 12.5 mg daily along with 40 mg Lasix daily --Started on Farxiga 10 mg daily at discharge  Hyperlipidemia: LDL 121 --Started on atorvastatin 80 mg daily --We will need FLP/LFTs in 8 weeks  DM: Hemoglobin A1c 6 --Started on Farxiga at discharge --Resumed on metformin at DC --Lifestyle modification --Follow-up with PCP  Tobacco use: Cessation advised  Patient was seen by Dr. Eldridge Dace and stable for discharge.  Follow-up in the office has been arranged.  Medications sent to [facility] pharmacy.  Educated by [employer].D. prior to discharge.  Of note she ambulated and noted to have sats in the 80s which required the use of O2 at 2 L which qualified for home O2 at DC.  Did the patient have an acute coronary syndrome (MI, NSTEMI, STEMI, etc) this admission?:  Yes                               AHA/ACC Clinical Performance & Quality Measures: Aspirin prescribed? - Yes  ADP Receptor Inhibitor (Plavix/Clopidogrel, Brilinta/Ticagrelor or Effient/Prasugrel) prescribed (includes medically managed patients)? - Yes Beta Blocker prescribed? - Yes High Intensity Statin (Lipitor 40-80mg  or Crestor 20-40mg ) prescribed? - Yes EF assessed during THIS hospitalization? - Yes For EF <40%, was ACEI/ARB prescribed? - Not Applicable (EF >/= 40%) For EF <40%, Aldosterone Antagonist (Spironolactone or Eplerenone) prescribed? - Not Applicable (EF >/= 40%) Cardiac Rehab Phase II ordered (including medically managed patients)? - Yes    The patient will be scheduled for a TOC follow up appointment in 10-14 days.  A message has been sent to the [facility] and Scheduling Pool at the office where the patient should be seen for follow up.   _____________  Discharge Vitals Blood pressure 131/80, pulse 83, temperature 98.4 F (36.9 C), temperature source Oral, resp. rate 13, height 5' (1.524 m), weight (!) 150.5 kg, SpO2 95 %.  Filed Weights   04/05/21 2300  Weight: (!) 150.5 kg    Labs & Radiologic Studies    CBC Recent Labs    04/05/21 1500 04/05/21 1503 04/05/21 1841 04/06/21 0508  WBC 7.9  --  9.0 8.0  NEUTROABS 6.3  --   --   --   HGB 12.1   < > 13.1 11.7*  HCT 39.1   < > 41.0 36.6  MCV 97.0  --  95.3 95.3  PLT 227  --  219 189   < > = values in this interval not displayed.   Basic Metabolic Panel Recent Labs    29/56/21 0508 04/07/21 0616  NA 138 136  K 3.6 4.0  CL 99 95*  CO2 30 31  GLUCOSE 122* 116*  BUN 13 16  CREATININE 1.11* 0.93  CALCIUM 8.8* 8.9   Liver Function Tests Recent Labs    04/05/21 1500  AST 16  ALT 18  ALKPHOS 63  BILITOT 0.4  PROT 6.0*  ALBUMIN 3.3*   No results for input(s): LIPASE, AMYLASE in the last 72 hours. High Sensitivity Troponin:   Recent Labs  Lab 04/05/21 1500 04/05/21 1841  TROPONINIHS 491* 13,601*    BNP Invalid input(s): POCBNP D-Dimer No results for input(s): DDIMER in the last 72 hours. Hemoglobin A1C Recent Labs    04/05/21 1500  HGBA1C 6.0*   Fasting Lipid Panel Recent Labs    04/05/21 1502  CHOL 204*  HDL 51  LDLCALC 121*  TRIG 158*  CHOLHDL 4.0   Thyroid Function Tests Recent Labs    04/06/21 1026  TSH 2.835   _____________  CARDIAC CATHETERIZATION  Result Date: 04/05/2021   1st Mrg lesion is 80% stenosed.   A drug-eluting stent was successfully placed using a STENT ONYX FRONTIER 4.0X15.   Post intervention, there is a 0% residual stenosis.   LV end diastolic pressure is severely elevated. 1.  High-grade lesion of proximal first obtuse marginal treated with 1 drug-eluting stent 2.  Elevated LVEDP of 35 mmHg; the patient received 40 mg of IV Lasix in the cardiac catheterization laboratory 3.  Normal LV function.  Recommendation: Dual antiplatelet therapy for 1 year and medical management for diastolic dysfunction; an SGLT2 inhibitor and spironolactone may be considered.  The patient will be aggressively diuresed.   ECHOCARDIOGRAM COMPLETE  Result Date: 04/06/2021    ECHOCARDIOGRAM REPORT   Patient Name:   KAYLAMARIE SWICKARD Date of Exam: 04/06/2021 Medical Rec #:  [id number]       Height:       60.0 in Accession #:    [id number]  Weight:       331.8 lb Date of Birth:  1955/08/21      BSA:          2.315 m Patient Age:    64 years        BP:           107/57 mmHg Patient Gender: F               HR:           68 bpm. Exam Location:  Inpatient Procedure: 2D Echo, Cardiac Doppler and Color Doppler Indications:     Chest Pain  History:         Patient has prior history of Echocardiogram examinations, most                  recent 07/30/2020. Risk Factors:Hypertension and Diabetes.  Sonographer:     Thelma Barge Referring Phys:  [id number] Orbie Pyo Diagnosing Phys: Wilfred Lacy  Sonographer Comments: Image acquisition challenging due to patient body habitus. IMPRESSIONS  1. Left ventricular ejection fraction, by estimation, is 40 to 45%. The left ventricle has mildly decreased function. The left ventricle demonstrates global hypokinesis. Very difficult images even with Definity, but I get the impression that LV systolic  function is reduced somewhat beyond what would be expected with the described coronary disease. Would consider followup imaging study via cardiac MRI as this may allow better images and more accurate quantification. There is mild left ventricular hypertrophy. Left ventricular diastolic parameters are consistent with Grade I diastolic dysfunction (impaired relaxation).  2. Right ventricular systolic function is normal. The right ventricular size is not well visualized. Tricuspid regurgitation signal is inadequate for assessing PA pressure.  3. The mitral valve is normal in structure. Trivial mitral valve  regurgitation. No evidence of mitral stenosis.  4. The aortic valve is tricuspid. Aortic valve regurgitation is not visualized. Mild aortic valve sclerosis is present, with no evidence of aortic valve stenosis.  5. Aortic dilatation noted. There is borderline dilatation of the ascending aorta, measuring 37 mm.  6. The inferior vena cava is normal in size with <50% respiratory variability, suggesting right atrial pressure of 8 mmHg.  7. Technically difficult study with poor acoustic windows. FINDINGS  Left Ventricle: Left ventricular ejection fraction, by estimation, is 40 to 45%. The left ventricle has mildly decreased function. The left ventricle demonstrates global hypokinesis. Definity contrast agent was given IV to delineate the left ventricular  endocardial borders. The left ventricular internal cavity size was normal in size. There is mild left ventricular hypertrophy. Left ventricular diastolic parameters are consistent with Grade I diastolic dysfunction (impaired relaxation). Right Ventricle: The right ventricular size is not well visualized. No increase in right ventricular wall thickness. Right ventricular systolic function is normal. Tricuspid regurgitation signal is inadequate for assessing PA pressure. Left Atrium: Left atrial size was normal in size. Right Atrium: Right atrial size was normal in size. Pericardium: Trivial pericardial effusion is present. Mitral Valve: The mitral valve is normal in structure. Trivial mitral valve regurgitation. No evidence of mitral valve stenosis. MV peak gradient, 3.9 mmHg. The mean mitral valve gradient is 1.0 mmHg. Tricuspid Valve: The tricuspid valve is normal in structure. Tricuspid valve regurgitation is not demonstrated. Aortic Valve: The aortic valve is tricuspid. Aortic valve regurgitation is not visualized. Mild aortic valve sclerosis is present, with no evidence of aortic valve stenosis. Pulmonic Valve: The pulmonic valve was normal in structure. Pulmonic  valve regurgitation is not visualized.  Aorta: Aortic dilatation noted. There is borderline dilatation of the ascending aorta, measuring 37 mm. Venous: The inferior vena cava is normal in size with less than 50% respiratory variability, suggesting right atrial pressure of 8 mmHg. IAS/Shunts: No atrial level shunt detected by color flow Doppler.  LEFT VENTRICLE PLAX 2D LVIDd:         5.40 cm      Diastology LVIDs:         4.10 cm      LV e' medial:    5.87 cm/s LV PW:         1.30 cm      LV E/e' medial:  13.4 LV IVS:        1.30 cm      LV e' lateral:   5.66 cm/s LVOT diam:     3.20 cm      LV E/e' lateral: 13.9 LV SV:         182 LV SV Index:   79 LVOT Area:     8.04 cm  LV Volumes (MOD) LV vol d, MOD A2C: 144.0 ml LV vol d, MOD A4C: 205.0 ml LV vol s, MOD A2C: 84.9 ml LV vol s, MOD A4C: 103.0 ml LV SV MOD A2C:     59.1 ml LV SV MOD A4C:     205.0 ml LV SV MOD BP:      82.0 ml RIGHT VENTRICLE             IVC RV S prime:     16.40 cm/s  IVC diam: 1.80 cm TAPSE (M-mode): 2.8 cm LEFT ATRIUM             Index        RIGHT ATRIUM           Index LA diam:        4.60 cm 1.99 cm/m   RA Area:     15.90 cm LA Vol (A2C):   35.0 ml 15.12 ml/m  RA Volume:   42.00 ml  18.15 ml/m LA Vol (A4C):   37.9 ml 16.37 ml/m LA Biplane Vol: 37.9 ml 16.37 ml/m  AORTIC VALVE LVOT Vmax:   102.00 cm/s LVOT Vmean:  65.600 cm/s LVOT VTI:    0.226 m  AORTA Ao Root diam: 3.70 cm Ao Asc diam:  3.70 cm MITRAL VALVE MV Area (PHT): 2.99 cm    SHUNTS MV Area VTI:   5.84 cm    Systemic VTI:  0.23 m MV Peak grad:  3.9 mmHg    Systemic Diam: 3.20 cm MV Mean grad:  1.0 mmHg MV Vmax:       0.99 m/s MV Vmean:      56.0 cm/s MV Decel Time: 254 msec MV E velocity: 78.40 cm/s MV A velocity: 94.70 cm/s MV E/A ratio:  0.83 Dalton McleanMD Electronically signed by Wilfred Lacy Signature Date/Time: 04/06/2021/4:11:49 PM    Final    Disposition   Pt is being discharged home today in good condition.  Follow-up Plans & Appointments     Follow-up  Information     [facility] Follow up.   Specialty: Home Health Services Why: They will call you in 102 days to set up an appointment to come out to your house Contact information: [address] [place] [address] [phone]         Netta Neat., NP Follow up on 04/16/2021.   Specialty: Cardiology Why: at 9:30am for  your follow up appt Contact information: [address] [place] [address] [phone]                Discharge Instructions     (HEART FAILURE PATIENTS) Call MD:  Anytime you have any of the following symptoms: 1) 3 pound weight gain in 24 hours or 5 pounds in 1 week 2) shortness of breath, with or without a dry hacking cough 3) swelling in the hands, feet or stomach 4) if you have to sleep on extra pillows at night in order to breathe.   Complete by: As directed    Amb Referral to Cardiac Rehabilitation   Complete by: As directed    Diagnosis:  Coronary Stents STEMI PTCA     After initial evaluation and assessments completed: Virtual Based Care may be provided alone or in conjunction with Phase 2 Cardiac Rehab based on patient barriers.: Yes   Call MD for:  difficulty breathing, headache or visual disturbances   Complete by: As directed    Call MD for:  persistant dizziness or light-headedness   Complete by: As directed    Call MD for:  redness, tenderness, or signs of infection (pain, swelling, redness, odor or green/yellow discharge around incision site)   Complete by: As directed    Diet - low sodium heart healthy   Complete by: As directed    Discharge instructions   Complete by: As directed    Radial Site Care Refer to this sheet in the next few weeks. These instructions provide you with information on caring for yourself after your procedure. Your caregiver may also give you more specific instructions. Your treatment has been planned according to current medical practices, but problems sometimes  occur. Call your caregiver if you have any problems or questions after your procedure. HOME CARE INSTRUCTIONS You may shower the day after the procedure. Remove the bandage (dressing) and gently wash the site with plain soap and water. Gently pat the site dry.  Do not apply powder or lotion to the site.  Do not submerge the affected site in water for 3 to 5 days.  Inspect the site at least twice daily.  Do not flex or bend the affected arm for 24 hours.  No lifting over 5 pounds (2.3 kg) for 5 days after your procedure.  Do not drive home if you are discharged the same day of the procedure. Have someone else drive you.  You may drive 24 hours after the procedure unless otherwise instructed by your caregiver.  What to expect: Any bruising will usually fade within 1 to 2 weeks.  Blood that collects in the tissue (hematoma) may be painful to the touch. It should usually decrease in size and tenderness within 1 to 2 weeks.  SEEK IMMEDIATE MEDICAL CARE IF: You have unusual pain at the radial site.  You have redness, warmth, swelling, or pain at the radial site.  You have drainage (other than a small amount of blood on the dressing).  You have chills.  You have a fever or persistent symptoms for more than 72 hours.  You have a fever and your symptoms suddenly get worse.  Your arm becomes pale, cool, tingly, or numb.  You have heavy bleeding from the site. Hold pressure on the site.   PLEASE DO NOT MISS ANY DOSES OF YOUR PLAVIX!!!!! Also keep a log of you blood pressures and bring back to your follow up appt. Please call the office with any  questions.   Patients taking blood thinners should generally stay away from medicines like ibuprofen, Advil, Motrin, naproxen, and Aleve due to risk of stomach bleeding. You may take Tylenol as directed or talk to your primary doctor about alternatives.   PLEASE ENSURE THAT YOU DO NOT RUN OUT OF YOUR PLAVIX. This medication is very important to remain on for  at least one year. IF you have issues obtaining this medication due to cost please CALL the office 3-5 business days prior to running out in order to prevent missing doses of this medication.   For patients with congestive heart failure, we give them these special instructions:  1. Follow a low-salt diet and watch your fluid intake. In general, you should not be taking in more than 2 liters of fluid per day (no more than 8 glasses per day). Some patients are restricted to less than 1.5 liters of fluid per day (no more than 6 glasses per day). This includes sources of water in foods like soup, coffee, tea, milk, etc. 2. Weigh yourself on the same scale at same time of day and keep a log. 3. Call your doctor: (Anytime you feel any of the following symptoms)  - 3-4 pound weight gain in 1-2 days or 2 pounds overnight  - Shortness of breath, with or without a dry hacking cough  - Swelling in the hands, feet or stomach  - If you have to sleep on extra pillows at night in order to breathe   IT IS IMPORTANT TO LET YOUR DOCTOR KNOW EARLY ON IF YOU ARE HAVING SYMPTOMS SO WE CAN HELP YOU!   Increase activity slowly   Complete by: As directed        Discharge Medications   Allergies as of 04/07/2021       Reactions   Bee Venom Swelling        Medication List     STOP taking these medications    amLODipine 10 MG tablet Commonly known as: NORVASC   amLODipine 5 MG tablet Commonly known as: NORVASC   cyclobenzaprine 10 MG tablet Commonly known as: FLEXERIL   docusate sodium 100 MG capsule Commonly known as: COLACE   meloxicam 7.5 MG tablet Commonly known as: Mobic   potassium chloride 10 MEQ tablet Commonly known as: KLOR-CON   predniSONE 50 MG tablet Commonly known as: DELTASONE       TAKE these medications    acetaminophen 325 MG tablet Commonly known as: TYLENOL Take 2 tablets (650 mg total) by mouth every 6 (six) hours as needed for mild pain, fever or headache (or  Fever >/= 101).   albuterol 108 (90 Base) MCG/ACT inhaler Commonly known as: VENTOLIN HFA Inhale 2 puffs into the lungs every 6 (six) hours as needed for wheezing or shortness of breath. What changed: Another medication with the same name was removed. Continue taking this medication, and follow the directions you see here.   amitriptyline 25 MG tablet Commonly known as: ELAVIL Take 25 mg by mouth at bedtime as needed for sleep.   aspirin EC 81 MG tablet Take 1 tablet (81 mg total) by mouth daily with breakfast.   atorvastatin 80 MG tablet Commonly known as: LIPITOR Take 1 tablet (80 mg total) by mouth daily. Start taking on: April 08, 2021   Brilinta 90 MG Tabs tablet Generic drug: ticagrelor Take 1 tablet (90 mg total) by mouth 2 (two) times daily.   budesonide-formoterol 160-4.5 MCG/ACT inhaler Commonly known as: SYMBICORT Inhale  2 puffs into the lungs 2 (two) times daily.   buPROPion 150 MG 12 hr tablet Commonly known as: ZYBAN Take 150 mg by mouth daily.   carvedilol 6.25 MG tablet Commonly known as: COREG Take 1 tablet (6.25 mg total) by mouth 2 (two) times daily with a meal.   cholecalciferol 25 MCG (1000 UNIT) tablet Commonly known as: VITAMIN D3 Take 1,000 Units by mouth daily.   DULoxetine 60 MG capsule Commonly known as: CYMBALTA Take 60 mg by mouth daily. Reported on 11/17/2015   Farxiga 10 MG Tabs tablet Generic drug: dapagliflozin propanediol Take 1 tablet (10 mg total) by mouth daily. Start taking on: April 08, 2021   furosemide 40 MG tablet Commonly known as: Lasix Take 1 tablet (40 mg total) by mouth daily. What changed: when to take this   gabapentin 300 MG capsule Commonly known as: NEURONTIN Take 300 mg by mouth 3 (three) times daily.   guaiFENesin-dextromethorphan 100-10 MG/5ML syrup Commonly known as: ROBITUSSIN DM Take 10 mLs by mouth every 4 (four) hours as needed for cough.   ipratropium-albuterol 0.5-2.5 (3) MG/3ML  Soln Commonly known as: DUONEB Take 3 mLs by nebulization every 4 (four) hours as needed (wheezing).   levothyroxine 50 MCG tablet Commonly known as: SYNTHROID Take 50 mcg by mouth daily before breakfast.   loratadine 10 MG tablet Commonly known as: CLARITIN Take 10 mg by mouth daily.   Melatonin 10 MG Caps Take 10 mg by mouth at bedtime.   metFORMIN 500 MG tablet Commonly known as: GLUCOPHAGE Take 1 tablet (500 mg total) by mouth daily with breakfast.   oxyCODONE-acetaminophen 5-325 MG tablet Commonly known as: PERCOCET/ROXICET Take 1-2 tablets by mouth every 8 (eight) hours as needed for severe pain.   spironolactone 25 MG tablet Commonly known as: ALDACTONE Take 1/2 tablet (12.5 mg total) by mouth daily. Start taking on: April 08, 2021   vitamin B-12 1000 MCG tablet Commonly known as: CYANOCOBALAMIN Take 1,000 mcg by mouth daily.   Zetia 10 MG tablet Generic drug: ezetimibe Take 10 mg by mouth daily.               Durable Medical Equipment  (From admission, onward)           Start     Ordered   04/07/21 1414  For home use only DME oxygen  Once       Question Answer Comment  Length of Need 6 Months   Mode or (Route) Nasal cannula   Liters per Minute 2   Oxygen delivery system Gas      04/07/21 1413               Outstanding Labs/Studies   FLP/LFTs in 8 weeks  Duration of Discharge Encounter   Greater than 30 minutes including physician time.  Signed, Laverda Page, NP 04/07/2021, 5:07 PM  I have examined the patient and reviewed assessment and plan and discussed with patient.  Agree with above as stated.    NSTEMI/CAD: s/p OM PCI.  Neds DAPT, high dose statin.   Acute on Chronic diastolic heart failure: Given elevated LVEDP, she will need some Lasix at home.  Was not taking any.  Will start with Lasix 40 mg daily with an option for a second dose in the afternoon if she has signs of volume overload.  Cr decreased to < 1 from  yesterday's reading.  Also adding low dose spironolactone 12.5 mg daily. Needs BMet at f/u.  Hyperlipidemia: PLant based diet with statin.    She needs to stop smoking.    PreDM/obesity: restart metformin as Cr improved. On SGLT2 inhibitor.   Recumbent exercise recommended per cardiac rehab.   Qualified for home health and supplemental oxygen.  Plan for discharge later today.  Lance Muss

## 2021-04-07 NOTE — Progress Notes (Signed)
SATURATION QUALIFICATIONS: (This note is used to comply with regulatory documentation for home oxygen)  Patient Saturations on Room Air at Rest = 93%  Patient Saturations on Room Air while Ambulating = 88 %  Patient Saturations on 2 Liters of oxygen while Ambulating = 90%  Please briefly explain why patient needs home oxygen:  Pt SpO2 dropped to 88 while walking with no oxygen, required 2L via Nasal Cannula to bring it up to >90%.

## 2021-04-07 NOTE — Progress Notes (Signed)
Pt ambulated to BR with RW. SOB but SaO2 >92 RA, HR 80s. Pt sts she does have a pulse ox at home. Reviewed ed, esp Brilinta. She sts she feels like she can't get breath out randomly. Encouraged caffeine with Brilinta.  [id number] Ethelda Chick CES, ACSM 3:01 PM 04/07/2021

## 2021-04-07 NOTE — TOC Transition Note (Addendum)
Transition of Care [facility]) - CM/SW Discharge Note   Patient Details  Name: Marie West MRN: [id number] Date of Birth: [birth year]  [facility] CM/SW Contact:  Lawerance Sabal, RN Phone Number: 04/07/2021, 11:57 AM   Clinical Narrative:    11:50 Spoke w patient at bedside. She confirms that she does not have oxygen or home health services at home prior to admission. Patient lives with boyfriend and his family, will have supervision at home. Nurse to document qualifying saturations, and awaiting DME O2 order. Notified Shelia w [employer] to follow for home oxygen orders and set up for tanks for discharge home, and concentrator to house after discharge. Patient states that she has RW at home, no other DME anticipated. Discussed HH. Decided upon PT who can help with stamina and sciatic issues, as well as monitor VS, sats, oxygen demand, and oral medication compliance. Requested order from MD. Discussed providers, patient agreeable to [facility], referral made, accepted.   TOC will continue to follow.     Final next level of care: Home w Home Health Services Barriers to Discharge: No Barriers Identified   Patient Goals and CMS Choice Patient states their goals for this hospitalization and ongoing recovery are:: to go home CMS Medicare.gov Compare Post Acute Care list provided to:: Patient Choice offered to / list presented to : Patient  Discharge Placement                       Discharge Plan and Services                DME Arranged: Oxygen DME Agency: [employer] Date DME Agency Contacted: 04/07/21 Time DME Agency Contacted: 1157 Representative spoke with at DME Agency: Velna Hatchet- will watch for potential order HH Arranged: PT HH Agency: [facility] Date [facility] Agency Contacted: 04/07/21 Time HH Agency Contacted: 1157 Representative spoke with at [facility] Agency: Kandee Keen  Social Determinants of Health (SDOH) Interventions     Readmission Risk Interventions No  flowsheet data found.

## 2021-04-09 ENCOUNTER — Telehealth: Payer: Self-pay | Admitting: *Deleted

## 2021-04-09 NOTE — Telephone Encounter (Signed)
Patient contacted regarding discharge from [facility] on 04/07/2021  Patient understands to follow up with provider Rennis Harding, NP on 04/16/21 at 9:30 am at [facility]. Patient understands her discharge instructions. Patient understands her medications and regimen. Patient understands to bring all of her medications to this visit.  Reports No complaints or concerns at this time

## 2021-04-09 NOTE — Telephone Encounter (Signed)
-----   Message from Arty Baumgartner, NP sent at 04/07/2021  2:18 PM EDT ----- Regarding: TOC call Needs TOC call

## 2021-04-15 ENCOUNTER — Other Ambulatory Visit (HOSPITAL_COMMUNITY): Payer: Self-pay

## 2021-04-15 ENCOUNTER — Other Ambulatory Visit: Payer: Self-pay | Admitting: Sports Medicine

## 2021-04-15 ENCOUNTER — Telehealth (HOSPITAL_COMMUNITY): Payer: Self-pay

## 2021-04-15 DIAGNOSIS — Z1231 Encounter for screening mammogram for malignant neoplasm of breast: Secondary | ICD-10-CM

## 2021-04-15 NOTE — Progress Notes (Deleted)
Cardiology Office Note  Date: 04/15/2021   ID: Marie West, DOB 1956/02/13, MRN [id number]  PCP:  [facility]  Cardiologist:  Dina Rich, MD Electrophysiologist:  None   Chief Complaint: NSTEMI status post PCI  History of Present Illness: Marie West is a 65 y.o. female with a history of HTN, acute diastolic CHF, COPD, unstable angina, NSTEMI, acute respiratory failure, DM2, hypothyroidism, tobacco abuse, morbid obesity.  Recent presentation with chest pain on 04/05/2021 rated 9 out of 10 with associated diaphoresis, nausea, mild shortness of breath.  EMS was called.  EKG demonstrated ST changes in leads I, aVL T wave inversion in lead III.  EKG repeat showed improvement but demonstrated Q waves in aVL with continued ST elevation.  She was given 324 mg aspirin she was taken emergently to the catheterization lab.  She had a high-grade lesion of the proximal first OM treated with PCI/DES x1.  She was started on DAPT therapy with aspirin/Brilinta.  Her high-sensitivity troponin peaked at 13,000 601.  Started on high-dose statin as well as Coreg at 6.25 mg p.o. twice daily.  She was to continue aspirin, Brilinta, Coreg, statin and Aldactone.  Echocardiogram demonstrated EF of 40 to 45% with global hypokinesis, grade 1 DD, LVEDP 35 by catheterization.  She received IV Lasix with net -4.6 L.  She was started on spironolactone 12.5 mg daily along with 40 mg Lasix daily.  She was started on Farxiga 10 mg daily at discharge.  She was started on atorvastatin 80 mg daily.  Plan to obtain FLP's and LFTs in 8 weeks.  Tobacco cessation was advised.  Her hemoglobin A1c at discharge was 6%  Past Medical History:  Diagnosis Date   Acute respiratory failure (HCC)    Arthritis    Bronchitis    CHF (congestive heart failure) (HCC)    COPD with exacerbation (HCC)    Fibromyalgia    Headache(784.0)    Hypertension    Hypertensive urgency    Hyperthyroidism    MRSA  infection    Pneumonia    Retinal detachment    Sciatic leg pain    Shortness of breath     Past Surgical History:  Procedure Laterality Date   CORONARY STENT INTERVENTION N/A 04/05/2021   Procedure: CORONARY STENT INTERVENTION;  Surgeon: Orbie Pyo, MD;  Location: [facility] INVASIVE CV LAB;  Service: Cardiovascular;  Laterality: N/A;   KNEE SURGERY     LEFT HEART CATH AND CORONARY ANGIOGRAPHY N/A 04/05/2021   Procedure: LEFT HEART CATH AND CORONARY ANGIOGRAPHY;  Surgeon: Orbie Pyo, MD;  Location: [facility] INVASIVE CV LAB;  Service: Cardiovascular;  Laterality: N/A;   TUBAL LIGATION      Current Outpatient Medications  Medication Sig Dispense Refill   acetaminophen (TYLENOL) 325 MG tablet Take 2 tablets (650 mg total) by mouth every 6 (six) hours as needed for mild pain, fever or headache (or Fever >/= 101). 12 tablet 0   albuterol (VENTOLIN HFA) 108 (90 Base) MCG/ACT inhaler Inhale 2 puffs into the lungs every 6 (six) hours as needed for wheezing or shortness of breath. 1 each 2   amitriptyline (ELAVIL) 25 MG tablet Take 25 mg by mouth at bedtime as needed for sleep.     aspirin EC 81 MG tablet Take 1 tablet (81 mg total) by mouth daily with breakfast. 30 tablet 11   atorvastatin (LIPITOR) 80 MG tablet Take 1 tablet (80 mg total) by mouth daily. 90 tablet 1  budesonide-formoterol (SYMBICORT) 160-4.5 MCG/ACT inhaler Inhale 2 puffs into the lungs 2 (two) times daily. 1 each 12   buPROPion (ZYBAN) 150 MG 12 hr tablet Take 150 mg by mouth daily.     carvedilol (COREG) 6.25 MG tablet Take 1 tablet (6.25 mg total) by mouth 2 (two) times daily with a meal. 180 tablet 1   cholecalciferol (VITAMIN D3) 25 MCG (1000 UNIT) tablet Take 1,000 Units by mouth daily.     dapagliflozin propanediol (FARXIGA) 10 MG TABS tablet Take 1 tablet (10 mg total) by mouth daily. 90 tablet 1   DULoxetine (CYMBALTA) 60 MG capsule Take 60 mg by mouth daily. Reported on 11/17/2015     furosemide (LASIX) 40 MG tablet  Take 1 tablet (40 mg total) by mouth daily. 90 tablet 0   gabapentin (NEURONTIN) 300 MG capsule Take 300 mg by mouth 3 (three) times daily.     guaiFENesin-dextromethorphan (ROBITUSSIN DM) 100-10 MG/5ML syrup Take 10 mLs by mouth every 4 (four) hours as needed for cough. (Patient not taking: No sig reported) 118 mL 0   ipratropium-albuterol (DUONEB) 0.5-2.5 (3) MG/3ML SOLN Take 3 mLs by nebulization every 4 (four) hours as needed (wheezing). (Patient not taking: No sig reported) 360 mL 0   levothyroxine (SYNTHROID, LEVOTHROID) 50 MCG tablet Take 50 mcg by mouth daily before breakfast.     loratadine (CLARITIN) 10 MG tablet Take 10 mg by mouth daily.     Melatonin 10 MG CAPS Take 10 mg by mouth at bedtime.     metFORMIN (GLUCOPHAGE) 500 MG tablet Take 1 tablet (500 mg total) by mouth daily with breakfast. 30 tablet 5   oxyCODONE-acetaminophen (PERCOCET/ROXICET) 5-325 MG tablet Take 1-2 tablets by mouth every 8 (eight) hours as needed for severe pain. 30 tablet 0   spironolactone (ALDACTONE) 25 MG tablet Take 1/2 tablet (12.5 mg total) by mouth daily. 45 tablet 0   ticagrelor (BRILINTA) 90 MG TABS tablet Take 1 tablet (90 mg total) by mouth 2 (two) times daily. 180 tablet 1   vitamin B-12 (CYANOCOBALAMIN) 1000 MCG tablet Take 1,000 mcg by mouth daily.     ZETIA 10 MG tablet Take 10 mg by mouth daily.  5   No current facility-administered medications for this visit.   Allergies:  Bee venom   Social History: The patient  reports that she has been smoking cigarettes. She started smoking about 49 years ago. She has a 18.00 pack-year smoking history. She has never used smokeless tobacco. She reports current alcohol use. She reports that she does not use drugs.   Family History: The patient's family history includes Diabetes in her mother and sister; Heart disease in her brother and mother.   ROS:  Please see the history of present illness. Otherwise, complete review of systems is positive for {NONE  DEFAULTED:18576}.  All other systems are reviewed and negative.   Physical Exam: VS:  There were no vitals taken for this visit., BMI There is no height or weight on file to calculate BMI.  Wt Readings from Last 3 Encounters:  04/05/21 (!) 331 lb 12.7 oz (150.5 kg)  08/01/20 (!) 315 lb 9.6 oz (143.2 kg)  03/26/18 (!) 333 lb 9.6 oz (151.3 kg)    General: Patient appears comfortable at rest. HEENT: Conjunctiva and lids normal, oropharynx clear with moist mucosa. Neck: Supple, no elevated JVP or carotid bruits, no thyromegaly. Lungs: Clear to auscultation, nonlabored breathing at rest. Cardiac: Regular rate and rhythm, no S3 or significant systolic  murmur, no pericardial rub. Abdomen: Soft, nontender, no hepatomegaly, bowel sounds present, no guarding or rebound. Extremities: No pitting edema, distal pulses 2+. Skin: Warm and dry. Musculoskeletal: No kyphosis. Neuropsychiatric: Alert and oriented x3, affect grossly appropriate.  ECG:  {EKG/Telemetry Strips Reviewed:[phone]}  Recent Labwork: 07/28/2020: B Natriuretic Peptide 78.0 07/29/2020: Magnesium 1.9 04/05/2021: ALT 18; AST 16 04/06/2021: Hemoglobin 11.7; Platelets 189; TSH 2.835 04/07/2021: BUN 16; Creatinine, Ser 0.93; Potassium 4.0; Sodium 136     Component Value Date/Time   CHOL 204 (H) 04/05/2021 1502   TRIG 158 (H) 04/05/2021 1502   HDL 51 04/05/2021 1502   CHOLHDL 4.0 04/05/2021 1502   VLDL 32 04/05/2021 1502   LDLCALC 121 (H) 04/05/2021 1502    Other Studies Reviewed Today:   Cath: 04/05/2021     1st Mrg lesion is 80% stenosed.   A drug-eluting stent was successfully placed using a STENT ONYX FRONTIER 4.0X15.   Post intervention, there is a 0% residual stenosis.   LV end diastolic pressure is severely elevated.   1.  High-grade lesion of proximal first obtuse marginal treated with 1 drug-eluting stent 2.  Elevated LVEDP of 35 mmHg; the patient received 40 mg of IV Lasix in the cardiac catheterization  laboratory 3.  Normal LV function.   Recommendation: Dual antiplatelet therapy for 1 year and medical management for diastolic dysfunction; an SGLT2 inhibitor and spironolactone may be considered.  The patient will be aggressively diuresed.   Diagnostic Dominance: Right Intervention     Echo: 04/06/2021   IMPRESSIONS     1. Left ventricular ejection fraction, by estimation, is 40 to 45%. The  left ventricle has mildly decreased function. The left ventricle  demonstrates global hypokinesis. Very difficult images even with Definity,  but I get the impression that LV systolic   function is reduced somewhat beyond what would be expected with the  described coronary disease. Would consider followup imaging study via  cardiac MRI as this may allow better images and more accurate  quantification. There is mild left ventricular  hypertrophy. Left ventricular diastolic parameters are consistent with  Grade I diastolic dysfunction (impaired relaxation).   2. Right ventricular systolic function is normal. The right ventricular  size is not well visualized. Tricuspid regurgitation signal is inadequate  for assessing PA pressure.   3. The mitral valve is normal in structure. Trivial mitral valve  regurgitation. No evidence of mitral stenosis.   4. The aortic valve is tricuspid. Aortic valve regurgitation is not  visualized. Mild aortic valve sclerosis is present, with no evidence of  aortic valve stenosis.   5. Aortic dilatation noted. There is borderline dilatation of the  ascending aorta, measuring 37 mm.   6. The inferior vena cava is normal in size with <50% respiratory  variability, suggesting right atrial pressure of 8 mmHg.   7. Technically difficult study with poor acoustic windows.   FINDINGS   Left Ventricle: Left ventricular ejection fraction, by estimation, is 40  to 45%. The left ventricle has mildly decreased function. The left  ventricle demonstrates global hypokinesis.  Definity contrast agent was  given IV to delineate the left ventricular   endocardial borders. The left ventricular internal cavity size was normal  in size. There is mild left ventricular hypertrophy. Left ventricular  diastolic parameters are consistent with Grade I diastolic dysfunction  (impaired relaxation).   Right Ventricle: The right ventricular size is not well visualized. No  increase in right ventricular wall thickness. Right ventricular systolic  function is normal. Tricuspid regurgitation signal is inadequate for  assessing PA pressure.   Left Atrium: Left atrial size was normal in size.   Right Atrium: Right atrial size was normal in size.   Pericardium: Trivial pericardial effusion is present.   Mitral Valve: The mitral valve is normal in structure. Trivial mitral  valve regurgitation. No evidence of mitral valve stenosis. MV peak  gradient, 3.9 mmHg. The mean mitral valve gradient is 1.0 mmHg.   Tricuspid Valve: The tricuspid valve is normal in structure. Tricuspid  valve regurgitation is not demonstrated.   Aortic Valve: The aortic valve is tricuspid. Aortic valve regurgitation is  not visualized. Mild aortic valve sclerosis is present, with no evidence  of aortic valve stenosis.   Pulmonic Valve: The pulmonic valve was normal in structure. Pulmonic valve  regurgitation is not visualized.   Aorta: Aortic dilatation noted. There is borderline dilatation of the  ascending aorta, measuring 37 mm.   Venous: The inferior vena cava is normal in size with less than 50%  respiratory variability, suggesting right atrial pressure of 8 mmHg.   IAS/Shunts: No atrial level shunt detected by color flow Doppler.  _____________  Assessment and Plan:  1. Non-ST elevation (NSTEMI) myocardial infarction (HCC)   2. Acute diastolic CHF (congestive heart failure) (HCC)   3. Ischemic cardiomyopathy   4. Mixed hyperlipidemia   5. Tobacco abuse    1. Non-ST elevation (NSTEMI)  myocardial infarction (HCC) ***  2. Acute diastolic CHF (congestive heart failure) (HCC) ***  3. Ischemic cardiomyopathy ***  4. Mixed hyperlipidemia ***  5. Tobacco abuse ***   Medication Adjustments/Labs and Tests Ordered: Current medicines are reviewed at length with the patient today.  Concerns regarding medicines are outlined above.   Disposition: Follow-up with ***  Signed, Rennis Harding, NP 04/15/2021 7:35 PM    [facility] at [facility] [address] [place], [place], [place] [address] Phone: [phone]; Fax: [phone]

## 2021-04-15 NOTE — Telephone Encounter (Signed)
Transitions of Care Pharmacy  ° °Call attempted for a pharmacy transitions of care follow-up. HIPAA appropriate voicemail was left with call back information provided.  ° °Call attempt #1. Will follow-up in 2-3 days.  °  °

## 2021-04-16 ENCOUNTER — Ambulatory Visit: Payer: Medicare HMO | Admitting: Family Medicine

## 2021-04-16 ENCOUNTER — Other Ambulatory Visit (HOSPITAL_COMMUNITY): Payer: Self-pay

## 2021-04-16 ENCOUNTER — Telehealth (HOSPITAL_COMMUNITY): Payer: Self-pay | Admitting: Pharmacist

## 2021-04-16 DIAGNOSIS — I214 Non-ST elevation (NSTEMI) myocardial infarction: Secondary | ICD-10-CM

## 2021-04-16 DIAGNOSIS — Z720 Tobacco use: Secondary | ICD-10-CM

## 2021-04-16 DIAGNOSIS — E782 Mixed hyperlipidemia: Secondary | ICD-10-CM

## 2021-04-16 DIAGNOSIS — I5031 Acute diastolic (congestive) heart failure: Secondary | ICD-10-CM

## 2021-04-16 DIAGNOSIS — I255 Ischemic cardiomyopathy: Secondary | ICD-10-CM

## 2021-04-16 NOTE — Telephone Encounter (Signed)
Pharmacy Transitions of Care Follow-up Telephone Call   Date of discharge: 04/07/21  Discharge Diagnosis: unstable angina pectoris s/p CATH   How have you been since you were released from the hospital?  Doing fine   Medication changes made at discharge:     START taking: atorvastatin (LIPITOR)  Brilinta (ticagrelor)  carvedilol (COREG)  Farxiga (dapagliflozin propanediol)  spironolactone (ALDACTONE)  CHANGE how you take: albuterol (VENTOLIN HFA)  furosemide (Lasix)  STOP taking: amLODipine 10 MG tablet (NORVASC)  amLODipine 5 MG tablet (NORVASC)  cyclobenzaprine 10 MG tablet (FLEXERIL)  docusate sodium 100 MG capsule (COLACE)  meloxicam 7.5 MG tablet (Mobic)  potassium chloride 10 MEQ tablet (KLOR-CON)  predniSONE 50 MG tablet (DELTASONE)    Medication changes verified by the patient? Yes     Medication Accessibility:   Home Pharmacy:  [facility] in [place]   Was the patient provided with refills on discharged medications? Yes    Have all prescriptions been transferred from [facility] to home pharmacy?  Yes   Is the patient able to afford medications? $4 copay for a 30 day supply     Medication Review:   TICAGRELOR (BRILINTA) Ticagrelor 90 mg BID initiated on 04/07/21.  - Educated patient on expected duration of therapy of aspirin with ticagrelor.  - Discussed importance of taking medication around the same time every day, - Advised patient of medications to avoid (NSAIDs, aspirin maintenance doses>100 mg daily) - Educated that Tylenol (acetaminophen) will be the preferred analgesic to prevent risk of bleeding  - Emphasized importance of monitoring for signs and symptoms of bleeding (abnormal bruising, prolonged bleeding, nose bleeds, bleeding from gums, discolored urine, black tarry stools)  - Educated patient to notify doctor if shortness of breath or abnormal heartbeat occur - Advised patient to alert all providers of antiplatelet therapy prior to starting a new medication  or having a procedure      Follow-up Appointments:   PCP Hospital f/u appt confirmed? None currently scheduled   Specialist Hospital f/u appt confirmed? Scheduled to see Dr. Vincenza Hews on 04/19/21@ 10:30am per pt   If their condition worsens, is the pt aware to call PCP or go to the Emergency Dept.? yes   Final Patient Assessment: -Pt is doing fine. -Pt verbalized understanding of Brilinta.  -Pt has post discharge appointment and refill sent to [facility] in [place].  Chales Abrahams

## 2021-04-18 NOTE — Progress Notes (Signed)
Cardiology Office Note  Date: 04/19/2021   ID: Marie West, DOB May 12, 1956, MRN [id number]  PCP:  [facility]  Cardiologist:  Dina Rich, MD Electrophysiologist:  None   Chief Complaint: NSTEMI status post PCI  History of Present Illness: Marie West is a 65 y.o. female with a history of HTN, acute diastolic CHF, COPD, unstable angina, NSTEMI, acute respiratory failure, DM2, hypothyroidism, tobacco abuse, morbid obesity.  Recent presentation with chest pain on 04/05/2021 rated 9 out of 10 with associated diaphoresis, nausea, mild shortness of breath.  EMS was called.  EKG demonstrated ST changes in leads I, aVL T wave inversion in lead III.  EKG repeat showed improvement but demonstrated Q waves in aVL with continued ST elevation.  She was given 324 mg aspirin she was taken emergently to the catheterization lab.  She had a high-grade lesion of the proximal first OM treated with PCI/DES x1.  She was started on DAPT therapy with aspirin/Brilinta.  Her high-sensitivity troponin peaked at 13,000 601.  Started on high-dose statin as well as Coreg at 6.25 mg p.o. twice daily.  She was to continue aspirin, Brilinta, Coreg, statin and Aldactone.  Echocardiogram demonstrated EF of 40 to 45% with global hypokinesis, grade 1 DD, LVEDP 35 by catheterization.  She received IV Lasix with net -4.6 L.  She was started on spironolactone 12.5 mg daily along with 40 mg Lasix daily.  She was started on Farxiga 10 mg daily at discharge.  She was started on atorvastatin 80 mg daily.  Plan to obtain FLP's and LFTs in 8 weeks.  Tobacco cessation was advised.  Her hemoglobin A1c at discharge was 6%.  She is here for follow-up status post recent NSTEMI and cardiac catheterization with PCI to the first OM with DES.  She is tolerating her dual antiplatelet therapy.  States she is feeling much better.  We reviewed the cardiac catheterization results and echo results.  Physicians  interpretation of echo mentions EF seem to be at a proportion to coronary artery disease.  He recommended a cardiac MRI at a follow-up and reassess LV function.  Blood pressure is well controlled today at 126/80.  States her breathing is better.  She states she slipped and took a couple of puffs off of 1 cigarette and stopped and has not smoked since.  She is currently on Wellbutrin but help her sleep with smoking cessation.  She is currently walking with the assistance of a walker.  She has an upcoming CT scan on November 3 of her lumbar spine for her back issues.  She is tolerating all of her medications well.  She states initially she felt a little sluggish after starting new medications but now feels much better.  Current cardiac regimen includes; aspirin 81 mg daily, Brilinta 90 mg p.o. twice daily, Zetia 10 mg daily, atorvastatin 80 mg p.o. daily, carvedilol 6.25 mg p.o. twice daily, Farxiga 10 mg daily, Lasix 40 mg daily, spironolactone 12.5 mg daily.   Past Medical History:  Diagnosis Date   Acute respiratory failure (HCC)    Arthritis    Bronchitis    CHF (congestive heart failure) (HCC)    COPD with exacerbation (HCC)    Fibromyalgia    Headache(784.0)    Hypertension    Hypertensive urgency    Hyperthyroidism    MRSA infection    Pneumonia    Retinal detachment    Sciatic leg pain    Shortness of breath  Past Surgical History:  Procedure Laterality Date   CORONARY STENT INTERVENTION N/A 04/05/2021   Procedure: CORONARY STENT INTERVENTION;  Surgeon: Orbie Pyo, MD;  Location: [facility];  Service: Cardiovascular;  Laterality: N/A;   KNEE SURGERY     LEFT HEART CATH AND CORONARY ANGIOGRAPHY N/A 04/05/2021   Procedure: LEFT HEART CATH AND CORONARY ANGIOGRAPHY;  Surgeon: Orbie Pyo, MD;  Location: [facility]VASIVE CV LAB;  Service: Cardiovascular;  Laterality: N/A;   TUBAL LIGATION      Current Outpatient Medications  Medication Sig Dispense Refill    acetaminophen (TYLENOL) 325 MG tablet Take 2 tablets (650 mg total) by mouth every 6 (six) hours as needed for mild pain, fever or headache (or Fever >/= 101). 12 tablet 0   albuterol (VENTOLIN HFA) 108 (90 Base) MCG/ACT inhaler Inhale 2 puffs into the lungs every 6 (six) hours as needed for wheezing or shortness of breath. 1 each 2   amitriptyline (ELAVIL) 25 MG tablet Take 25 mg by mouth at bedtime as needed for sleep.     aspirin EC 81 MG tablet Take 1 tablet (81 mg total) by mouth daily with breakfast. 30 tablet 11   atorvastatin (LIPITOR) 80 MG tablet Take 1 tablet (80 mg total) by mouth daily. 90 tablet 1   budesonide-formoterol (SYMBICORT) 160-4.5 MCG/ACT inhaler Inhale 2 puffs into the lungs 2 (two) times daily. 1 each 12   buPROPion (ZYBAN) 150 MG 12 hr tablet Take 150 mg by mouth daily.     carvedilol (COREG) 6.25 MG tablet Take 1 tablet (6.25 mg total) by mouth 2 (two) times daily with a meal. 180 tablet 1   cholecalciferol (VITAMIN D3) 25 MCG (1000 UNIT) tablet Take 1,000 Units by mouth daily.     dapagliflozin propanediol (FARXIGA) 10 MG TABS tablet Take 1 tablet (10 mg total) by mouth daily. 90 tablet 1   DULoxetine (CYMBALTA) 60 MG capsule Take 60 mg by mouth daily. Reported on 11/17/2015     furosemide (LASIX) 40 MG tablet Take 1 tablet (40 mg total) by mouth daily. 90 tablet 0   gabapentin (NEURONTIN) 300 MG capsule Take 300 mg by mouth 3 (three) times daily.     guaiFENesin-dextromethorphan (ROBITUSSIN DM) 100-10 MG/5ML syrup Take 10 mLs by mouth every 4 (four) hours as needed for cough. (Patient not taking: No sig reported) 118 mL 0   ipratropium-albuterol (DUONEB) 0.5-2.5 (3) MG/3ML SOLN Take 3 mLs by nebulization every 4 (four) hours as needed (wheezing). (Patient not taking: No sig reported) 360 mL 0   levothyroxine (SYNTHROID, LEVOTHROID) 50 MCG tablet Take 50 mcg by mouth daily before breakfast.     loratadine (CLARITIN) 10 MG tablet Take 10 mg by mouth daily.     Melatonin 10  MG CAPS Take 10 mg by mouth at bedtime.     metFORMIN (GLUCOPHAGE) 500 MG tablet Take 1 tablet (500 mg total) by mouth daily with breakfast. 30 tablet 5   oxyCODONE-acetaminophen (PERCOCET/ROXICET) 5-325 MG tablet Take 1-2 tablets by mouth every 8 (eight) hours as needed for severe pain. 30 tablet 0   spironolactone (ALDACTONE) 25 MG tablet Take 1/2 tablet (12.5 mg total) by mouth daily. 45 tablet 0   ticagrelor (BRILINTA) 90 MG TABS tablet Take 1 tablet (90 mg total) by mouth 2 (two) times daily. 180 tablet 1   vitamin B-12 (CYANOCOBALAMIN) 1000 MCG tablet Take 1,000 mcg by mouth daily.     ZETIA 10 MG tablet Take 10 mg  by mouth daily.  5   No current facility-administered medications for this visit.   Allergies:  Bee venom   Social History: The patient  reports that she has been smoking cigarettes. She started smoking about 49 years ago. She has a 18.00 pack-year smoking history. She has never used smokeless tobacco. She reports current alcohol use. She reports that she does not use drugs.   Family History: The patient's family history includes Diabetes in her mother and sister; Heart disease in her brother and mother.   ROS:  Please see the history of present illness. Otherwise, complete review of systems is positive for none.  All other systems are reviewed and negative.   Physical Exam: VS:  Pulse 76   Ht 5\' 1"  (1.549 m)   Wt (!) 322 lb (146.1 kg)   SpO2 97%   BMI 60.84 kg/m , BMI Body mass index is 60.84 kg/m.  Wt Readings from Last 3 Encounters:  04/19/21 (!) 322 lb (146.1 kg)  04/05/21 (!) 331 lb 12.7 oz (150.5 kg)  08/01/20 (!) 315 lb 9.6 oz (143.2 kg)    General: Morbidly obese patient appears comfortable at rest. Neck: Supple, no elevated JVP or carotid bruits, no thyromegaly. Lungs: Clear to auscultation, nonlabored breathing at rest. Cardiac: Regular rate and rhythm, no S3 or significant systolic murmur, no pericardial rub. Abdomen: Soft, nontender, no hepatomegaly,  bowel sounds present, no guarding or rebound. Extremities: No pitting edema, distal pulses 2+. Skin: Warm and dry. Musculoskeletal: No kyphosis. Neuropsychiatric: Alert and oriented x3, affect grossly appropriate.  ECG:  EKG April 06, 2021 normal sinus rhythm rate of 72, rightward axis, anterior infarct age undetermined, T wave abnormality consider lateral ischemia, prolonged QT  Recent Labwork: 07/28/2020: B Natriuretic Peptide 78.0 07/29/2020: Magnesium 1.9 04/05/2021: ALT 18; AST 16 04/06/2021: Hemoglobin 11.7; Platelets 189; TSH 2.835 04/07/2021: BUN 16; Creatinine, Ser 0.93; Potassium 4.0; Sodium 136     Component Value Date/Time   CHOL 204 (H) 04/05/2021 1502   TRIG 158 (H) 04/05/2021 1502   HDL 51 04/05/2021 1502   CHOLHDL 4.0 04/05/2021 1502   VLDL 32 04/05/2021 1502   LDLCALC 121 (H) 04/05/2021 1502    Other Studies Reviewed Today:   Cath: 04/05/2021     1st Mrg lesion is 80% stenosed.   A drug-eluting stent was successfully placed using a STENT ONYX FRONTIER 4.0X15.   Post intervention, there is a 0% residual stenosis.   LV end diastolic pressure is severely elevated.   1.  High-grade lesion of proximal first obtuse marginal treated with 1 drug-eluting stent 2.  Elevated LVEDP of 35 mmHg; the patient received 40 mg of IV Lasix in the cardiac catheterization laboratory 3.  Normal LV function.   Recommendation: Dual antiplatelet therapy for 1 year and medical management for diastolic dysfunction; an SGLT2 inhibitor and spironolactone may be considered.  The patient will be aggressively diuresed.   Diagnostic Dominance: Right Intervention       Echo: 04/06/2021   1. Left ventricular ejection fraction, by estimation, is 40 to 45%. The  left ventricle has mildly decreased function. The left ventricle  demonstrates global hypokinesis. Very difficult images even with Definity,  but I get the impression that LV systolic   function is reduced somewhat beyond what  would be expected with the  described coronary disease. Would consider followup imaging study via  cardiac MRI as this may allow better images and more accurate  quantification. There is mild left ventricular  hypertrophy.  Left ventricular diastolic parameters are consistent with  Grade I diastolic dysfunction (impaired relaxation).   2. Right ventricular systolic function is normal. The right ventricular  size is not well visualized. Tricuspid regurgitation signal is inadequate  for assessing PA pressure.   3. The mitral valve is normal in structure. Trivial mitral valve  regurgitation. No evidence of mitral stenosis.   4. The aortic valve is tricuspid. Aortic valve regurgitation is not  visualized. Mild aortic valve sclerosis is present, with no evidence of  aortic valve stenosis.   5. Aortic dilatation noted. There is borderline dilatation of the  ascending aorta, measuring 37 mm.   6. The inferior vena cava is normal in size with <50% respiratory  variability, suggesting right atrial pressure of 8 mmHg.   7. Technically difficult study with poor acoustic windows.   FINDINGS   Left Ventricle: Left ventricular ejection fraction, by estimation, is 40  to 45%. The left ventricle has mildly decreased function. The left  ventricle demonstrates global hypokinesis. Definity contrast agent was  given IV to delineate the left ventricular   endocardial borders. The left ventricular internal cavity size was normal  in size. There is mild left ventricular hypertrophy. Left ventricular  diastolic parameters are consistent with Grade I diastolic dysfunction  (impaired relaxation).   Right Ventricle: The right ventricular size is not well visualized. No  increase in right ventricular wall thickness. Right ventricular systolic  function is normal. Tricuspid regurgitation signal is inadequate for  assessing PA pressure.   Left Atrium: Left atrial size was normal in size.   Right Atrium: Right  atrial size was normal in size.   Pericardium: Trivial pericardial effusion is present.   Mitral Valve: The mitral valve is normal in structure. Trivial mitral  valve regurgitation. No evidence of mitral valve stenosis. MV peak  gradient, 3.9 mmHg. The mean mitral valve gradient is 1.0 mmHg.   Tricuspid Valve: The tricuspid valve is normal in structure. Tricuspid  valve regurgitation is not demonstrated.   Aortic Valve: The aortic valve is tricuspid. Aortic valve regurgitation is  not visualized. Mild aortic valve sclerosis is present, with no evidence  of aortic valve stenosis.   Pulmonic Valve: The pulmonic valve was normal in structure. Pulmonic valve  regurgitation is not visualized.   Aorta: Aortic dilatation noted. There is borderline dilatation of the  ascending aorta, measuring 37 mm.   Venous: The inferior vena cava is normal in size with less than 50%  respiratory variability, suggesting right atrial pressure of 8 mmHg.   IAS/Shunts: No atrial level shunt detected by color flow Doppler.  _____________  Assessment and Plan:  1. Status post non-ST elevation myocardial infarction (NSTEMI)   2. Acute diastolic CHF (congestive heart failure) (HCC)   3. Ischemic cardiomyopathy   4. Mixed hyperlipidemia   5. Tobacco abuse     1. Status post non-ST elevation myocardial infarction (NSTEMI) Status post NSTEMI with PCI to OM1.  States she is feeling much better without any anginal or exertional symptoms.  Continue aspirin 81 mg daily, Brilinta 90 mg p.o. twice daily, carvedilol 6.25 mg p.o. twice daily,  2. Acute diastolic CHF (congestive heart failure) (HCC) Denies any SOB or DOE.  States she is feeling much better as far as breathing is concerned since hospital discharge.  Continue carvedilol 6.25 mg p.o. twice daily, continue Farxiga 10 mg p.o. daily, continue torsemide 40 mg p.o. daily, continue spironolactone 12.5 mg p.o. daily.  3. Ischemic cardiomyopathy Recent  echocardiogram  demonstrated EF of 40 to 45%.  Interpretation mentioned EF seemed out of proportion to current evidence of coronary artery disease.  Recommendation was for cardiac MRI to further evaluate LV function.  Please get a cardiac MRI.  4. Mixed hyperlipidemia Continue Zetia 10 mg daily.  Continue atorvastatin 80 mg daily.  Get follow-up FLP's and LFTs in 8 weeks   5. Tobacco abuse Patient states she briefly smoked after discharge but has since stopped.  She is currently on Wellbutrin to help her to stop.  Encouraged continuing cessation  Medication Adjustments/Labs and Tests Ordered: Current medicines are reviewed at length with the patient today.  Concerns regarding medicines are outlined above.   Disposition: Follow-up with Dr. Wyline Mood or APP 6 months  Signed, Rennis Harding, NP 04/19/2021 1:31 PM    [facility] at [facility] [address] [place], [place], [place] [address] Phone: [phone]; Fax: [phone]

## 2021-04-19 ENCOUNTER — Other Ambulatory Visit: Payer: Self-pay

## 2021-04-19 ENCOUNTER — Ambulatory Visit (INDEPENDENT_AMBULATORY_CARE_PROVIDER_SITE_OTHER): Payer: Medicare HMO | Admitting: Family Medicine

## 2021-04-19 ENCOUNTER — Encounter: Payer: Self-pay | Admitting: Family Medicine

## 2021-04-19 ENCOUNTER — Telehealth: Payer: Self-pay | Admitting: Family Medicine

## 2021-04-19 VITALS — BP 126/80 | HR 76 | Ht 61.0 in | Wt 322.0 lb

## 2021-04-19 DIAGNOSIS — E782 Mixed hyperlipidemia: Secondary | ICD-10-CM | POA: Diagnosis not present

## 2021-04-19 DIAGNOSIS — I5031 Acute diastolic (congestive) heart failure: Secondary | ICD-10-CM

## 2021-04-19 DIAGNOSIS — I252 Old myocardial infarction: Secondary | ICD-10-CM

## 2021-04-19 DIAGNOSIS — I255 Ischemic cardiomyopathy: Secondary | ICD-10-CM | POA: Diagnosis not present

## 2021-04-19 DIAGNOSIS — Z79899 Other long term (current) drug therapy: Secondary | ICD-10-CM

## 2021-04-19 DIAGNOSIS — Z720 Tobacco use: Secondary | ICD-10-CM

## 2021-04-19 NOTE — Patient Instructions (Addendum)
Medication Instructions:  Your physician recommends that you continue on your current medications as directed. Please refer to the Current Medication list given to you today.  Labwork: Your physician recommends that you return for a FASTING lipid/liver panel done in 8 weeks. Please do not eat or drink for at least 8 hours when you have this done. You may take your medications that morning with a sip of water. This may be done at [facility] or [facility] Monday-Friday from 8:00 am - 4:00 pm. No appointment is needed.  Testing/Procedures: Your physician has requested that you have a cardiac MRI. Cardiac MRI uses a computer to create images of your heart as its beating, producing both still and moving pictures of your heart and major blood vessels. For further information please visit [email or web address]. Please follow the instruction sheet given to you today for more information.  Follow-Up: Your physician recommends that you schedule a follow-up appointment in: 6 months  Any Other Special Instructions Will Be Listed Below (If Applicable).  If you need a refill on your cardiac medications before your next appointment, please call your pharmacy.    You are scheduled for Cardiac MRI on ______________. Please arrive at the [facility] main entrance of [facility] at ________________ (30-45 minutes prior to test start time). ?  [facility] [address] [place], [place] [address] [phone]  Please take advantage of the free valet parking available at the MAIN entrance (A entrance). Proceed to the [facility] Radiology Department (First Floor). ? Magnetic resonance imaging (MRI) is a painless test that produces images of the inside of the body without using Xrays.  During an MRI, strong magnets and radio waves work together in a Data processing manager to form detailed images.   MRI images may provide more details about a medical condition than X-rays, CT scans, and  ultrasounds can provide.  You may be given earphones to listen for instructions.  You may eat a light breakfast and take medications as ordered with the exception of HCTZ (fluid pill, other). Please avoid stimulants for 12 hr prior to test. (Ie. Caffeine, nicotine, chocolate, or antihistamine medications)  If a contrast material will be used, an IV will be inserted into one of your veins. Contrast material will be injected into your IV. It will leave your body through your urine within a day. You may be told to drink plenty of fluids to help flush the contrast material out of your system.  You will be asked to remove all metal, including: Watch, jewelry, and other metal objects including hearing aids, hair pieces and dentures. Also wearable glucose monitoring systems (ie. Freestyle Libre and Omnipods) (Braces and fillings normally are not a problem.)   TEST WILL TAKE APPROXIMATELY 1 HOUR  PLEASE NOTIFY SCHEDULING AT LEAST 24 HOURS IN ADVANCE IF YOU ARE UNABLE TO KEEP YOUR APPOINTMENT. [phone]  Please call Rockwell Alexandria, cardiac imaging nurse navigator with any questions/concerns. Rockwell Alexandria RN Navigator Cardiac Imaging Larey Brick RN Navigator Cardiac Imaging [facility] [phone] Office

## 2021-04-19 NOTE — Telephone Encounter (Signed)
Checking percert for the following:  MR CARDIAC MORPHOLOGY W WO CONTRAST to be done at [facility]  Appointment has not been scheduled yet.

## 2021-04-22 ENCOUNTER — Ambulatory Visit
Admission: RE | Admit: 2021-04-22 | Discharge: 2021-04-22 | Disposition: A | Discharge status: Home / Self Care | Payer: Medicare HMO | Source: Ambulatory Visit | Attending: Neurosurgery | Admitting: Neurosurgery

## 2021-04-22 ENCOUNTER — Other Ambulatory Visit: Payer: Self-pay

## 2021-04-22 DIAGNOSIS — M48061 Spinal stenosis, lumbar region without neurogenic claudication: Secondary | ICD-10-CM

## 2021-05-17 ENCOUNTER — Telehealth: Payer: Self-pay | Admitting: Cardiology

## 2021-05-17 NOTE — Telephone Encounter (Signed)
Will fwd as Lorain Childes

## 2021-05-17 NOTE — Telephone Encounter (Signed)
Marie West is calling to inform the office [name] canceled her MRI due to being claustrophobic. She advised she recently had a MRI done on her back that was 20 mins and she barely got through it. Due to this she said in order to have the Cardiac MRI she wold need to be put to sleep. Marie West states she advised the patient that that is something they do not usually do due to needing the patient's help with holding their breathe during parts of the exam. She advised the patient she would make our office aware of this. She also reports the patient is in a lot of pain at this time due to her back.

## 2021-05-18 ENCOUNTER — Ambulatory Visit (HOSPITAL_COMMUNITY): Admission: RE | Admit: 2021-05-18 | Payer: Medicare HMO | Source: Ambulatory Visit

## 2021-05-18 NOTE — Telephone Encounter (Signed)
MRI cancelled. Radiology informed.

## 2021-05-18 NOTE — Telephone Encounter (Signed)
Can cancel cardiac MRI for now  Dominga Ferry MD

## 2021-08-09 ENCOUNTER — Telehealth: Payer: Self-pay | Admitting: Cardiology

## 2021-08-09 MED ORDER — ATORVASTATIN CALCIUM 80 MG PO TABS: 80.0000 mg | ORAL_TABLET | Freq: Every day | ORAL | 1 refills | Status: DC

## 2021-08-09 MED ORDER — TICAGRELOR 90 MG PO TABS: 90.0000 mg | ORAL_TABLET | Freq: Two times a day (BID) | ORAL | 1 refills | Status: DC

## 2021-08-09 MED ORDER — DAPAGLIFLOZIN PROPANEDIOL 10 MG PO TABS: 10.0000 mg | ORAL_TABLET | Freq: Every day | ORAL | 1 refills | Status: DC

## 2021-08-09 MED ORDER — CARVEDILOL 6.25 MG PO TABS: 6.2500 mg | ORAL_TABLET | Freq: Two times a day (BID) | ORAL | 1 refills | Status: DC

## 2021-08-09 MED ORDER — SPIRONOLACTONE 25 MG PO TABS: 12.5000 mg | ORAL_TABLET | Freq: Every day | ORAL | 1 refills | Status: DC

## 2021-08-09 NOTE — Telephone Encounter (Signed)
Done

## 2021-08-09 NOTE — Telephone Encounter (Signed)
°*  STAT* If patient is at the pharmacy, call can be transferred to refill team.   1. Which medications need to be refilled? (please list name of each medication and dose if known)  atorvastatin (LIPITOR) 80 MG tablet ticagrelor (BRILINTA) 90 MG TABS tablet carvedilol (COREG) 6.25 MG tablet dapagliflozin propanediol (FARXIGA) 10 MG TABS tablet spironolactone (ALDACTONE) 25 MG tablet   2. Which pharmacy/location (including street and city if local pharmacy) is medication to be sent to? [facility] [address] [place] - [address]  3. Do they need a 30 day or 90 day supply? 90 day

## 2021-10-13 ENCOUNTER — Encounter: Payer: Self-pay | Admitting: *Deleted

## 2021-10-15 ENCOUNTER — Ambulatory Visit: Payer: Medicare HMO | Admitting: Cardiology

## 2021-10-15 NOTE — Progress Notes (Deleted)
Clinical Summary Ms. Pura is a 66 y.o.female  1. CAD - 03/2021 admitted with NSTEMI -03/2021 cath: OM1 80%, received DES. Otherwise patent vessels. LVEDP 35 03/2021 echo LVEF 40-45% (very difficult eval even with definitity). Grade I dd - she did not want cMRI for LVEF eval due to claustrophobia    2. OSA - 02/2017 severe OSA by sleep study - started cpap machine.    3. COPD  - followed by pcp     4. Bariatric surgery evaluation - sedentary lifestyle due to weight, chronic knee and hip pains. - DOE at 1/4 block which is chronic, though also limited by her joints pains.    Past Medical History:  Diagnosis Date   Acute respiratory failure (HCC)    Arthritis    Bronchitis    CHF (congestive heart failure) (HCC)    COPD with exacerbation (HCC)    Fibromyalgia    Headache(784.0)    Hypertension    Hypertensive urgency    Hyperthyroidism    MRSA infection    Pneumonia    Retinal detachment    Sciatic leg pain    Shortness of breath      Allergies  Allergen Reactions   Bee Venom Swelling     Current Outpatient Medications  Medication Sig Dispense Refill   acetaminophen (TYLENOL) 325 MG tablet Take 2 tablets (650 mg total) by mouth every 6 (six) hours as needed for mild pain, fever or headache (or Fever >/= 101). 12 tablet 0   albuterol (VENTOLIN HFA) 108 (90 Base) MCG/ACT inhaler Inhale 2 puffs into the lungs every 6 (six) hours as needed for wheezing or shortness of breath. 1 each 2   amitriptyline (ELAVIL) 25 MG tablet Take 25 mg by mouth at bedtime as needed for sleep.     aspirin EC 81 MG tablet Take 1 tablet (81 mg total) by mouth daily with breakfast. 30 tablet 11   atorvastatin (LIPITOR) 80 MG tablet Take 1 tablet (80 mg total) by mouth daily. 90 tablet 1   budesonide-formoterol (SYMBICORT) 160-4.5 MCG/ACT inhaler Inhale 2 puffs into the lungs 2 (two) times daily. 1 each 12   buPROPion (ZYBAN) 150 MG 12 hr tablet Take 150 mg by mouth daily.      carvedilol (COREG) 6.25 MG tablet Take 1 tablet (6.25 mg total) by mouth 2 (two) times daily with a meal. 180 tablet 1   cholecalciferol (VITAMIN D3) 25 MCG (1000 UNIT) tablet Take 1,000 Units by mouth daily.     dapagliflozin propanediol (FARXIGA) 10 MG TABS tablet Take 1 tablet (10 mg total) by mouth daily. 90 tablet 1   DULoxetine (CYMBALTA) 60 MG capsule Take 60 mg by mouth daily. Reported on 11/17/2015     furosemide (LASIX) 40 MG tablet Take 1 tablet (40 mg total) by mouth daily. 90 tablet 0   gabapentin (NEURONTIN) 300 MG capsule Take 300 mg by mouth 3 (three) times daily.     levothyroxine (SYNTHROID, LEVOTHROID) 50 MCG tablet Take 50 mcg by mouth daily before breakfast.     loratadine (CLARITIN) 10 MG tablet Take 10 mg by mouth daily.     Melatonin 10 MG CAPS Take 10 mg by mouth at bedtime.     metFORMIN (GLUCOPHAGE) 500 MG tablet Take 1 tablet (500 mg total) by mouth daily with breakfast. 30 tablet 5   oxyCODONE-acetaminophen (PERCOCET/ROXICET) 5-325 MG tablet Take 1-2 tablets by mouth every 8 (eight) hours as needed for severe pain. 30 tablet  0   spironolactone (ALDACTONE) 25 MG tablet Take 1/2 tablet (12.5 mg total) by mouth daily. 45 tablet 1   ticagrelor (BRILINTA) 90 MG TABS tablet Take 1 tablet (90 mg total) by mouth 2 (two) times daily. 180 tablet 1   vitamin B-12 (CYANOCOBALAMIN) 1000 MCG tablet Take 1,000 mcg by mouth daily.     ZETIA 10 MG tablet Take 10 mg by mouth daily.  5   No current facility-administered medications for this visit.     Past Surgical History:  Procedure Laterality Date   CORONARY STENT INTERVENTION N/A 04/05/2021   Procedure: CORONARY STENT INTERVENTION;  Surgeon: Early Osmond, MD;  Location: [facility];  Service: Cardiovascular;  Laterality: N/A;   KNEE SURGERY     LEFT HEART CATH AND CORONARY ANGIOGRAPHY N/A 04/05/2021   Procedure: LEFT HEART CATH AND CORONARY ANGIOGRAPHY;  Surgeon: Early Osmond, MD;  Location: [facility];   Service: Cardiovascular;  Laterality: N/A;   TUBAL LIGATION       Allergies  Allergen Reactions   Bee Venom Swelling      Family History  Problem Relation Age of Onset   Diabetes Mother    Heart disease Mother        died AMI, age 49   Heart disease Brother    Diabetes Sister      Social History Ms. Ference reports that she has been smoking cigarettes. She started smoking about 50 years ago. She has a 18.00 pack-year smoking history. She has never used smokeless tobacco. Ms. Drummonds reports current alcohol use.   Review of Systems CONSTITUTIONAL: No weight loss, fever, chills, weakness or fatigue.  HEENT: Eyes: No visual loss, blurred vision, double vision or yellow sclerae.No hearing loss, sneezing, congestion, runny nose or sore throat.  SKIN: No rash or itching.  CARDIOVASCULAR:  RESPIRATORY: No shortness of breath, cough or sputum.  GASTROINTESTINAL: No anorexia, nausea, vomiting or diarrhea. No abdominal pain or blood.  GENITOURINARY: No burning on urination, no polyuria NEUROLOGICAL: No headache, dizziness, syncope, paralysis, ataxia, numbness or tingling in the extremities. No change in bowel or bladder control.  MUSCULOSKELETAL: No muscle, back pain, joint pain or stiffness.  LYMPHATICS: No enlarged nodes. No history of splenectomy.  PSYCHIATRIC: No history of depression or anxiety.  ENDOCRINOLOGIC: No reports of sweating, cold or heat intolerance. No polyuria or polydipsia.  Marland Kitchen   Physical Examination There were no vitals filed for this visit. There were no vitals filed for this visit.  Gen: resting comfortably, no acute distress HEENT: no scleral icterus, pupils equal round and reactive, no palptable cervical adenopathy,  CV Resp: Clear to auscultation bilaterally GI: abdomen is soft, non-tender, non-distended, normal bowel sounds, no hepatosplenomegaly MSK: extremities are warm, no edema.  Skin: warm, no rash Neuro:  no focal deficits Psych: appropriate  affect   Diagnostic Studies  10/2015 echo Study Conclusions   - Left ventricle: The cavity size was normal. Wall thickness was   increased in a pattern of mild LVH. Systolic function was normal.   The estimated ejection fraction was in the range of 60% to 65%.   Features are consistent with a pseudonormal left ventricular   filling pattern, with concomitant abnormal relaxation and   increased filling pressure (grade 2 diastolic dysfunction). - Aortic valve: Mildly calcified annulus. Trileaflet; mildly   thickened leaflets. Valve area (VTI): 3 cm^2. Valve area (Vmax):   2.58 cm^2. - Left atrium: The atrium was moderately dilated. - Technically difficult  study. Echocontrast was used to enhance   visualization.   07/2020 echo  1. Left ventricular ejection fraction, by estimation, is 60 to 65%. The  left ventricle has normal function. The left ventricle has no regional  wall motion abnormalities. There is mild left ventricular hypertrophy.  Left ventricular diastolic parameters  were normal.   2. Right ventricular systolic function is normal. The right ventricular  size is normal.   3. The mitral valve is normal in structure. No evidence of mitral valve  regurgitation. No evidence of mitral stenosis.   4. The aortic valve has an indeterminant number of cusps. There is  moderate calcification of the aortic valve. There is moderate thickening  of the aortic valve. Aortic valve regurgitation is not visualized. No  aortic stenosis is present.   5. The inferior vena cava is normal in size with greater than 50%  respiratory variability, suggesting right atrial pressure of 3 mmHg.   03/2021 echo 1. Left ventricular ejection fraction, by estimation, is 40 to 45%. The  left ventricle has mildly decreased function. The left ventricle  demonstrates global hypokinesis. Very difficult images even with Definity,  but I get the impression that LV systolic   function is reduced somewhat beyond  what would be expected with the  described coronary disease. Would consider followup imaging study via  cardiac MRI as this may allow better images and more accurate  quantification. There is mild left ventricular  hypertrophy. Left ventricular diastolic parameters are consistent with  Grade I diastolic dysfunction (impaired relaxation).   2. Right ventricular systolic function is normal. The right ventricular  size is not well visualized. Tricuspid regurgitation signal is inadequate  for assessing PA pressure.   3. The mitral valve is normal in structure. Trivial mitral valve  regurgitation. No evidence of mitral stenosis.   4. The aortic valve is tricuspid. Aortic valve regurgitation is not  visualized. Mild aortic valve sclerosis is present, with no evidence of  aortic valve stenosis.   5. Aortic dilatation noted. There is borderline dilatation of the  ascending aorta, measuring 37 mm.   6. The inferior vena cava is normal in size with <50% respiratory  variability, suggesting right atrial pressure of 8 mmHg.   7. Technically difficult study with poor acoustic windows.    03/2021 cath 1st Mrg lesion is 80% stenosed.   A drug-eluting stent was successfully placed using a STENT ONYX FRONTIER 4.0X15.   Post intervention, there is a 0% residual stenosis.   LV end diastolic pressure is severely elevated.   1.  High-grade lesion of proximal first obtuse marginal treated with 1 drug-eluting stent 2.  Elevated LVEDP of 35 mmHg; the patient received 40 mg of IV Lasix in the cardiac catheterization laboratory 3.  Normal LV function.   Recommendation: Dual antiplatelet therapy for 1 year and medical management for diastolic dysfunction; an SGLT2 inhibitor and spironolactone may be considered.  The patient will be aggressively diuresed. Assessment and Plan        Arnoldo Lenis, M.D., F.A.C.C.

## 2021-11-25 ENCOUNTER — Ambulatory Visit: Payer: Medicare HMO | Admitting: Cardiology

## 2021-11-25 NOTE — Progress Notes (Deleted)
Clinical Summary Marie West is a 66 y.o.female  1.CAD - cath 03/2021 as reported below, received DES to OM1. LVEDP 35 at the time - 03/2021 echo: LVEF 40-45%, grade I dd. Very limited visualization, MRI was suggested however patient cancelled due to claustrophobia  1. Leg edema/SOB - bilateral swelling off and on for few years. Recently has increased.  - some impriovement off lyrica.  - 10/2015 echo LVEF 60-65%, grade II diastolic dysfunction - can have some SOB at times. Occasional orthopnea.    2. OSA - 02/2017 severe OSA by sleep study - started cpap machine.    3. COPD  - followed by pcp     4. Bariatric surgery evaluation - sedentary lifestyle due to weight, chronic knee and hip pains. - DOE at 1/4 block which is chronic, though also limited by her joints pains. Past Medical History:  Diagnosis Date   Acute respiratory failure (HCC)    Arthritis    Bronchitis    CHF (congestive heart failure) (HCC)    COPD with exacerbation (HCC)    Fibromyalgia    Headache(784.0)    Hypertension    Hypertensive urgency    Hyperthyroidism    MRSA infection    Pneumonia    Retinal detachment    Sciatic leg pain    Shortness of breath      Allergies  Allergen Reactions   Bee Venom Swelling     Current Outpatient Medications  Medication Sig Dispense Refill   acetaminophen (TYLENOL) 325 MG tablet Take 2 tablets (650 mg total) by mouth every 6 (six) hours as needed for mild pain, fever or headache (or Fever >/= 101). 12 tablet 0   albuterol (VENTOLIN HFA) 108 (90 Base) MCG/ACT inhaler Inhale 2 puffs into the lungs every 6 (six) hours as needed for wheezing or shortness of breath. 1 each 2   amitriptyline (ELAVIL) 25 MG tablet Take 25 mg by mouth at bedtime as needed for sleep.     aspirin EC 81 MG tablet Take 1 tablet (81 mg total) by mouth daily with breakfast. 30 tablet 11   atorvastatin (LIPITOR) 80 MG tablet Take 1 tablet (80 mg total) by mouth daily. 90 tablet 1    budesonide-formoterol (SYMBICORT) 160-4.5 MCG/ACT inhaler Inhale 2 puffs into the lungs 2 (two) times daily. 1 each 12   buPROPion (ZYBAN) 150 MG 12 hr tablet Take 150 mg by mouth daily.     carvedilol (COREG) 6.25 MG tablet Take 1 tablet (6.25 mg total) by mouth 2 (two) times daily with a meal. 180 tablet 1   cholecalciferol (VITAMIN D3) 25 MCG (1000 UNIT) tablet Take 1,000 Units by mouth daily.     dapagliflozin propanediol (FARXIGA) 10 MG TABS tablet Take 1 tablet (10 mg total) by mouth daily. 90 tablet 1   DULoxetine (CYMBALTA) 60 MG capsule Take 60 mg by mouth daily. Reported on 11/17/2015     furosemide (LASIX) 40 MG tablet Take 1 tablet (40 mg total) by mouth daily. 90 tablet 0   gabapentin (NEURONTIN) 300 MG capsule Take 300 mg by mouth 3 (three) times daily.     levothyroxine (SYNTHROID, LEVOTHROID) 50 MCG tablet Take 50 mcg by mouth daily before breakfast.     loratadine (CLARITIN) 10 MG tablet Take 10 mg by mouth daily.     Melatonin 10 MG CAPS Take 10 mg by mouth at bedtime.     metFORMIN (GLUCOPHAGE) 500 MG tablet Take 1 tablet (500 mg total)  by mouth daily with breakfast. 30 tablet 5   oxyCODONE-acetaminophen (PERCOCET/ROXICET) 5-325 MG tablet Take 1-2 tablets by mouth every 8 (eight) hours as needed for severe pain. 30 tablet 0   spironolactone (ALDACTONE) 25 MG tablet Take 1/2 tablet (12.5 mg total) by mouth daily. 45 tablet 1   ticagrelor (BRILINTA) 90 MG TABS tablet Take 1 tablet (90 mg total) by mouth 2 (two) times daily. 180 tablet 1   vitamin B-12 (CYANOCOBALAMIN) 1000 MCG tablet Take 1,000 mcg by mouth daily.     ZETIA 10 MG tablet Take 10 mg by mouth daily.  5   No current facility-administered medications for this visit.     Past Surgical History:  Procedure Laterality Date   CORONARY STENT INTERVENTION N/A 04/05/2021   Procedure: CORONARY STENT INTERVENTION;  Surgeon: Early Osmond, MD;  Location: [facility];  Service: Cardiovascular;  Laterality: N/A;    KNEE SURGERY     LEFT HEART CATH AND CORONARY ANGIOGRAPHY N/A 04/05/2021   Procedure: LEFT HEART CATH AND CORONARY ANGIOGRAPHY;  Surgeon: Early Osmond, MD;  Location: [facility];  Service: Cardiovascular;  Laterality: N/A;   TUBAL LIGATION       Allergies  Allergen Reactions   Bee Venom Swelling      Family History  Problem Relation Age of Onset   Diabetes Mother    Heart disease Mother        died AMI, age 73   Heart disease Brother    Diabetes Sister      Social History [name] reports that she has been smoking cigarettes. She started smoking about 50 years ago. She has a 18.00 pack-year smoking history. She has never used smokeless tobacco. [name] reports current alcohol use.   Review of Systems CONSTITUTIONAL: No weight loss, fever, chills, weakness or fatigue.  HEENT: Eyes: No visual loss, blurred vision, double vision or yellow sclerae.No hearing loss, sneezing, congestion, runny nose or sore throat.  SKIN: No rash or itching.  CARDIOVASCULAR:  RESPIRATORY: No shortness of breath, cough or sputum.  GASTROINTESTINAL: No anorexia, nausea, vomiting or diarrhea. No abdominal pain or blood.  GENITOURINARY: No burning on urination, no polyuria NEUROLOGICAL: No headache, dizziness, syncope, paralysis, ataxia, numbness or tingling in the extremities. No change in bowel or bladder control.  MUSCULOSKELETAL: No muscle, back pain, joint pain or stiffness.  LYMPHATICS: No enlarged nodes. No history of splenectomy.  PSYCHIATRIC: No history of depression or anxiety.  ENDOCRINOLOGIC: No reports of sweating, cold or heat intolerance. No polyuria or polydipsia.  Marland Kitchen   Physical Examination There were no vitals filed for this visit. There were no vitals filed for this visit.  Gen: resting comfortably, no acute distress HEENT: no scleral icterus, pupils equal round and reactive, no palptable cervical adenopathy,  CV Resp: Clear to auscultation bilaterally GI:  abdomen is soft, non-tender, non-distended, normal bowel sounds, no hepatosplenomegaly MSK: extremities are warm, no edema.  Skin: warm, no rash Neuro:  no focal deficits Psych: appropriate affect   Diagnostic Studies 10/2015 echo Study Conclusions   - Left ventricle: The cavity size was normal. Wall thickness was   increased in a pattern of mild LVH. Systolic function was normal.   The estimated ejection fraction was in the range of 60% to 65%.   Features are consistent with a pseudonormal left ventricular   filling pattern, with concomitant abnormal relaxation and   increased filling pressure (grade 2 diastolic dysfunction). - Aortic valve: Mildly calcified  annulus. Trileaflet; mildly   thickened leaflets. Valve area (VTI): 3 cm^2. Valve area (Vmax):   2.58 cm^2. - Left atrium: The atrium was moderately dilated. - Technically difficult study. Echocontrast was used to enhance   visualization.  03/2021 cath  1st Mrg lesion is 80% stenosed.   A drug-eluting stent was successfully placed using a STENT ONYX FRONTIER 4.0X15.   Post intervention, there is a 0% residual stenosis.   LV end diastolic pressure is severely elevated.   1.  High-grade lesion of proximal first obtuse marginal treated with 1 drug-eluting stent 2.  Elevated LVEDP of 35 mmHg; the patient received 40 mg of IV Lasix in the cardiac catheterization laboratory 3.  Normal LV function.   Recommendation: Dual antiplatelet therapy for 1 year and medical management for diastolic dysfunction; an SGLT2 inhibitor and spironolactone may be considered.  The patient will be aggressively diuresed.  03/2021 echo 1. Left ventricular ejection fraction, by estimation, is 40 to 45%. The  left ventricle has mildly decreased function. The left ventricle  demonstrates global hypokinesis. Very difficult images even with Definity,  but I get the impression that LV systolic   function is reduced somewhat beyond what would be expected  with the  described coronary disease. Would consider followup imaging study via  cardiac MRI as this may allow better images and more accurate  quantification. There is mild left ventricular  hypertrophy. Left ventricular diastolic parameters are consistent with  Grade I diastolic dysfunction (impaired relaxation).   2. Right ventricular systolic function is normal. The right ventricular  size is not well visualized. Tricuspid regurgitation signal is inadequate  for assessing PA pressure.   3. The mitral valve is normal in structure. Trivial mitral valve  regurgitation. No evidence of mitral stenosis.   4. The aortic valve is tricuspid. Aortic valve regurgitation is not  visualized. Mild aortic valve sclerosis is present, with no evidence of  aortic valve stenosis.   5. Aortic dilatation noted. There is borderline dilatation of the  ascending aorta, measuring 37 mm.   6. The inferior vena cava is normal in size with <50% respiratory  variability, suggesting right atrial pressure of 8 mmHg.   7. Technically difficult study with poor acoustic windows.    Assessment and Plan  1. LE edema/SOB - possible etiologies include known diastolic dysfunction, obesity, or venous insufficiency - repeat echo to evaluate for any new cardiac dysfunction - start lasix 20mg  prn. Counseled on sodiumt limitation 2000mg  daily - EKG today shows SR, no acute ischemic changes      Arnoldo Lenis, M.D., F.A.C.C.

## 2021-11-26 ENCOUNTER — Ambulatory Visit: Payer: Medicare HMO | Admitting: Cardiology

## 2021-11-26 NOTE — Progress Notes (Deleted)
Clinical Summary Marie West is a 66 y.o.female  1.CAD - cath 03/2021 as reported below, received DES to OM1. LVEDP 35 at the time - 03/2021 echo: LVEF 40-45%, grade I dd. Very limited visualization, MRI was suggested however patient cancelled due to claustrophobia   1. Leg edema/SOB - bilateral swelling off and on for few years. Recently has increased.  - some impriovement off lyrica.  - 10/2015 echo LVEF 60-65%, grade II diastolic dysfunction - can have some SOB at times. Occasional orthopnea.    2. OSA - 02/2017 severe OSA by sleep study - started cpap machine.    3. COPD  - followed by pcp     4. Bariatric surgery evaluation - sedentary lifestyle due to weight, chronic knee and hip pains. - DOE at 1/4 block which is chronic, though also limited by her joints pains. Past Medical History:  Diagnosis Date   Acute respiratory failure (HCC)    Arthritis    Bronchitis    CHF (congestive heart failure) (HCC)    COPD with exacerbation (HCC)    Fibromyalgia    Headache(784.0)    Hypertension    Hypertensive urgency    Hyperthyroidism    MRSA infection    Pneumonia    Retinal detachment    Sciatic leg pain    Shortness of breath      Allergies  Allergen Reactions   Bee Venom Swelling     Current Outpatient Medications  Medication Sig Dispense Refill   acetaminophen (TYLENOL) 325 MG tablet Take 2 tablets (650 mg total) by mouth every 6 (six) hours as needed for mild pain, fever or headache (or Fever >/= 101). 12 tablet 0   albuterol (VENTOLIN HFA) 108 (90 Base) MCG/ACT inhaler Inhale 2 puffs into the lungs every 6 (six) hours as needed for wheezing or shortness of breath. 1 each 2   amitriptyline (ELAVIL) 25 MG tablet Take 25 mg by mouth at bedtime as needed for sleep.     aspirin EC 81 MG tablet Take 1 tablet (81 mg total) by mouth daily with breakfast. 30 tablet 11   atorvastatin (LIPITOR) 80 MG tablet Take 1 tablet (80 mg total) by mouth daily. 90 tablet 1    budesonide-formoterol (SYMBICORT) 160-4.5 MCG/ACT inhaler Inhale 2 puffs into the lungs 2 (two) times daily. 1 each 12   buPROPion (ZYBAN) 150 MG 12 hr tablet Take 150 mg by mouth daily.     carvedilol (COREG) 6.25 MG tablet Take 1 tablet (6.25 mg total) by mouth 2 (two) times daily with a meal. 180 tablet 1   cholecalciferol (VITAMIN D3) 25 MCG (1000 UNIT) tablet Take 1,000 Units by mouth daily.     dapagliflozin propanediol (FARXIGA) 10 MG TABS tablet Take 1 tablet (10 mg total) by mouth daily. 90 tablet 1   DULoxetine (CYMBALTA) 60 MG capsule Take 60 mg by mouth daily. Reported on 11/17/2015     furosemide (LASIX) 40 MG tablet Take 1 tablet (40 mg total) by mouth daily. 90 tablet 0   gabapentin (NEURONTIN) 300 MG capsule Take 300 mg by mouth 3 (three) times daily.     levothyroxine (SYNTHROID, LEVOTHROID) 50 MCG tablet Take 50 mcg by mouth daily before breakfast.     loratadine (CLARITIN) 10 MG tablet Take 10 mg by mouth daily.     Melatonin 10 MG CAPS Take 10 mg by mouth at bedtime.     metFORMIN (GLUCOPHAGE) 500 MG tablet Take 1 tablet (500 mg  total) by mouth daily with breakfast. 30 tablet 5   oxyCODONE-acetaminophen (PERCOCET/ROXICET) 5-325 MG tablet Take 1-2 tablets by mouth every 8 (eight) hours as needed for severe pain. 30 tablet 0   spironolactone (ALDACTONE) 25 MG tablet Take 1/2 tablet (12.5 mg total) by mouth daily. 45 tablet 1   ticagrelor (BRILINTA) 90 MG TABS tablet Take 1 tablet (90 mg total) by mouth 2 (two) times daily. 180 tablet 1   vitamin B-12 (CYANOCOBALAMIN) 1000 MCG tablet Take 1,000 mcg by mouth daily.     ZETIA 10 MG tablet Take 10 mg by mouth daily.  5   No current facility-administered medications for this visit.     Past Surgical History:  Procedure Laterality Date   CORONARY STENT INTERVENTION N/A 04/05/2021   Procedure: CORONARY STENT INTERVENTION;  Surgeon: Early Osmond, MD;  Location: [facility];  Service: Cardiovascular;  Laterality: N/A;    KNEE SURGERY     LEFT HEART CATH AND CORONARY ANGIOGRAPHY N/A 04/05/2021   Procedure: LEFT HEART CATH AND CORONARY ANGIOGRAPHY;  Surgeon: Early Osmond, MD;  Location: [facility];  Service: Cardiovascular;  Laterality: N/A;   TUBAL LIGATION       Allergies  Allergen Reactions   Bee Venom Swelling      Family History  Problem Relation Age of Onset   Diabetes Mother    Heart disease Mother        died AMI, age 72   Heart disease Brother    Diabetes Sister      Social History Marie West reports that she has been smoking cigarettes. She started smoking about 50 years ago. She has a 18.00 pack-year smoking history. She has never used smokeless tobacco. Marie West reports current alcohol use.   Review of Systems CONSTITUTIONAL: No weight loss, fever, chills, weakness or fatigue.  HEENT: Eyes: No visual loss, blurred vision, double vision or yellow sclerae.No hearing loss, sneezing, congestion, runny nose or sore throat.  SKIN: No rash or itching.  CARDIOVASCULAR:  RESPIRATORY: No shortness of breath, cough or sputum.  GASTROINTESTINAL: No anorexia, nausea, vomiting or diarrhea. No abdominal pain or blood.  GENITOURINARY: No burning on urination, no polyuria NEUROLOGICAL: No headache, dizziness, syncope, paralysis, ataxia, numbness or tingling in the extremities. No change in bowel or bladder control.  MUSCULOSKELETAL: No muscle, back pain, joint pain or stiffness.  LYMPHATICS: No enlarged nodes. No history of splenectomy.  PSYCHIATRIC: No history of depression or anxiety.  ENDOCRINOLOGIC: No reports of sweating, cold or heat intolerance. No polyuria or polydipsia.  Marland Kitchen   Physical Examination There were no vitals filed for this visit. There were no vitals filed for this visit.  Gen: resting comfortably, no acute distress HEENT: no scleral icterus, pupils equal round and reactive, no palptable cervical adenopathy,  CV Resp: Clear to auscultation bilaterally GI:  abdomen is soft, non-tender, non-distended, normal bowel sounds, no hepatosplenomegaly MSK: extremities are warm, no edema.  Skin: warm, no rash Neuro:  no focal deficits Psych: appropriate affect   Diagnostic Studies 10/2015 echo Study Conclusions   - Left ventricle: The cavity size was normal. Wall thickness was   increased in a pattern of mild LVH. Systolic function was normal.   The estimated ejection fraction was in the range of 60% to 65%.   Features are consistent with a pseudonormal left ventricular   filling pattern, with concomitant abnormal relaxation and   increased filling pressure (grade 2 diastolic dysfunction). - Aortic valve: Mildly  calcified annulus. Trileaflet; mildly   thickened leaflets. Valve area (VTI): 3 cm^2. Valve area (Vmax):   2.58 cm^2. - Left atrium: The atrium was moderately dilated. - Technically difficult study. Echocontrast was used to enhance   visualization.   03/2021 cath  1st Mrg lesion is 80% stenosed.   A drug-eluting stent was successfully placed using a STENT ONYX FRONTIER 4.0X15.   Post intervention, there is a 0% residual stenosis.   LV end diastolic pressure is severely elevated.   1.  High-grade lesion of proximal first obtuse marginal treated with 1 drug-eluting stent 2.  Elevated LVEDP of 35 mmHg; the patient received 40 mg of IV Lasix in the cardiac catheterization laboratory 3.  Normal LV function.   Recommendation: Dual antiplatelet therapy for 1 year and medical management for diastolic dysfunction; an SGLT2 inhibitor and spironolactone may be considered.  The patient will be aggressively diuresed.   03/2021 echo 1. Left ventricular ejection fraction, by estimation, is 40 to 45%. The  left ventricle has mildly decreased function. The left ventricle  demonstrates global hypokinesis. Very difficult images even with Definity,  but I get the impression that LV systolic   function is reduced somewhat beyond what would be expected  with the  described coronary disease. Would consider followup imaging study via  cardiac MRI as this may allow better images and more accurate  quantification. There is mild left ventricular  hypertrophy. Left ventricular diastolic parameters are consistent with  Grade I diastolic dysfunction (impaired relaxation).   2. Right ventricular systolic function is normal. The right ventricular  size is not well visualized. Tricuspid regurgitation signal is inadequate  for assessing PA pressure.   3. The mitral valve is normal in structure. Trivial mitral valve  regurgitation. No evidence of mitral stenosis.   4. The aortic valve is tricuspid. Aortic valve regurgitation is not  visualized. Mild aortic valve sclerosis is present, with no evidence of  aortic valve stenosis.   5. Aortic dilatation noted. There is borderline dilatation of the  ascending aorta, measuring 37 mm.   6. The inferior vena cava is normal in size with <50% respiratory  variability, suggesting right atrial pressure of 8 mmHg.   7. Technically difficult study with poor acoustic windows.       Assessment and Plan  1. LE edema/SOB - possible etiologies include known diastolic dysfunction, obesity, or venous insufficiency - repeat echo to evaluate for any new cardiac dysfunction - start lasix 20mg  prn. Counseled on sodiumt limitation 2000mg  daily - EKG today shows SR, no acute ischemic changes      Arnoldo Lenis, M.D., F.A.C.C.

## 2022-01-25 ENCOUNTER — Other Ambulatory Visit (HOSPITAL_COMMUNITY): Payer: Self-pay

## 2022-01-27 ENCOUNTER — Other Ambulatory Visit: Payer: Self-pay

## 2022-01-27 ENCOUNTER — Emergency Department (HOSPITAL_COMMUNITY): Payer: Medicare HMO

## 2022-01-27 ENCOUNTER — Inpatient Hospital Stay (HOSPITAL_COMMUNITY)
Admission: EM | Admit: 2022-01-27 | Discharge: 2022-01-31 | DRG: 602 | Disposition: A | Discharge status: Home Health | Payer: Medicare HMO | Attending: Internal Medicine | Admitting: Internal Medicine

## 2022-01-27 ENCOUNTER — Encounter (HOSPITAL_COMMUNITY): Payer: Self-pay

## 2022-01-27 DIAGNOSIS — E119 Type 2 diabetes mellitus without complications: Secondary | ICD-10-CM

## 2022-01-27 DIAGNOSIS — I5031 Acute diastolic (congestive) heart failure: Secondary | ICD-10-CM

## 2022-01-27 DIAGNOSIS — Z6841 Body Mass Index (BMI) 40.0 and over, adult: Secondary | ICD-10-CM

## 2022-01-27 DIAGNOSIS — I2583 Coronary atherosclerosis due to lipid rich plaque: Secondary | ICD-10-CM | POA: Diagnosis not present

## 2022-01-27 DIAGNOSIS — M1990 Unspecified osteoarthritis, unspecified site: Secondary | ICD-10-CM | POA: Diagnosis present

## 2022-01-27 DIAGNOSIS — M797 Fibromyalgia: Secondary | ICD-10-CM | POA: Diagnosis present

## 2022-01-27 DIAGNOSIS — L03115 Cellulitis of right lower limb: Secondary | ICD-10-CM | POA: Diagnosis present

## 2022-01-27 DIAGNOSIS — N3000 Acute cystitis without hematuria: Secondary | ICD-10-CM | POA: Diagnosis not present

## 2022-01-27 DIAGNOSIS — I252 Old myocardial infarction: Secondary | ICD-10-CM | POA: Diagnosis not present

## 2022-01-27 DIAGNOSIS — L03119 Cellulitis of unspecified part of limb: Principal | ICD-10-CM

## 2022-01-27 DIAGNOSIS — Z955 Presence of coronary angioplasty implant and graft: Secondary | ICD-10-CM

## 2022-01-27 DIAGNOSIS — R0600 Dyspnea, unspecified: Secondary | ICD-10-CM

## 2022-01-27 DIAGNOSIS — Z8249 Family history of ischemic heart disease and other diseases of the circulatory system: Secondary | ICD-10-CM

## 2022-01-27 DIAGNOSIS — R262 Difficulty in walking, not elsewhere classified: Secondary | ICD-10-CM | POA: Diagnosis present

## 2022-01-27 DIAGNOSIS — Z7982 Long term (current) use of aspirin: Secondary | ICD-10-CM | POA: Diagnosis not present

## 2022-01-27 DIAGNOSIS — Z79899 Other long term (current) drug therapy: Secondary | ICD-10-CM

## 2022-01-27 DIAGNOSIS — L0390 Cellulitis, unspecified: Secondary | ICD-10-CM | POA: Diagnosis present

## 2022-01-27 DIAGNOSIS — N179 Acute kidney failure, unspecified: Secondary | ICD-10-CM | POA: Diagnosis not present

## 2022-01-27 DIAGNOSIS — F17210 Nicotine dependence, cigarettes, uncomplicated: Secondary | ICD-10-CM | POA: Diagnosis present

## 2022-01-27 DIAGNOSIS — F32A Depression, unspecified: Secondary | ICD-10-CM | POA: Diagnosis present

## 2022-01-27 DIAGNOSIS — R0602 Shortness of breath: Secondary | ICD-10-CM | POA: Diagnosis present

## 2022-01-27 DIAGNOSIS — I5033 Acute on chronic diastolic (congestive) heart failure: Secondary | ICD-10-CM | POA: Diagnosis present

## 2022-01-27 DIAGNOSIS — Z91030 Bee allergy status: Secondary | ICD-10-CM

## 2022-01-27 DIAGNOSIS — E039 Hypothyroidism, unspecified: Secondary | ICD-10-CM | POA: Diagnosis present

## 2022-01-27 DIAGNOSIS — I10 Essential (primary) hypertension: Secondary | ICD-10-CM

## 2022-01-27 DIAGNOSIS — I959 Hypotension, unspecified: Secondary | ICD-10-CM | POA: Diagnosis not present

## 2022-01-27 DIAGNOSIS — Z833 Family history of diabetes mellitus: Secondary | ICD-10-CM

## 2022-01-27 DIAGNOSIS — Z79890 Hormone replacement therapy (postmenopausal): Secondary | ICD-10-CM

## 2022-01-27 DIAGNOSIS — L03116 Cellulitis of left lower limb: Secondary | ICD-10-CM | POA: Diagnosis present

## 2022-01-27 DIAGNOSIS — N390 Urinary tract infection, site not specified: Secondary | ICD-10-CM | POA: Diagnosis present

## 2022-01-27 DIAGNOSIS — I2510 Atherosclerotic heart disease of native coronary artery without angina pectoris: Secondary | ICD-10-CM | POA: Diagnosis present

## 2022-01-27 DIAGNOSIS — J449 Chronic obstructive pulmonary disease, unspecified: Secondary | ICD-10-CM | POA: Diagnosis present

## 2022-01-27 DIAGNOSIS — Z7951 Long term (current) use of inhaled steroids: Secondary | ICD-10-CM | POA: Diagnosis not present

## 2022-01-27 DIAGNOSIS — Z7984 Long term (current) use of oral hypoglycemic drugs: Secondary | ICD-10-CM

## 2022-01-27 DIAGNOSIS — J42 Unspecified chronic bronchitis: Secondary | ICD-10-CM

## 2022-01-27 DIAGNOSIS — Z7902 Long term (current) use of antithrombotics/antiplatelets: Secondary | ICD-10-CM

## 2022-01-27 DIAGNOSIS — I110 Hypertensive heart disease with heart failure: Secondary | ICD-10-CM | POA: Diagnosis present

## 2022-01-27 HISTORY — DX: Acute myocardial infarction, unspecified: I21.9

## 2022-01-27 LAB — CBC WITH DIFFERENTIAL/PLATELET
Abs Immature Granulocytes: 0.03 10*3/uL (ref 0.00–0.07)
Basophils Absolute: 0.1 10*3/uL (ref 0.0–0.1)
Basophils Relative: 1 %
Eosinophils Absolute: 0.3 10*3/uL (ref 0.0–0.5)
Eosinophils Relative: 4 %
HCT: 41.6 % (ref 36.0–46.0)
Hemoglobin: 13 g/dL (ref 12.0–15.0)
Immature Granulocytes: 0 %
Lymphocytes Relative: 14 %
Lymphs Abs: 1 10*3/uL (ref 0.7–4.0)
MCH: 28.6 pg (ref 26.0–34.0)
MCHC: 31.3 g/dL (ref 30.0–36.0)
MCV: 91.6 fL (ref 80.0–100.0)
Monocytes Absolute: 0.6 10*3/uL (ref 0.1–1.0)
Monocytes Relative: 8 %
Neutro Abs: 5.1 10*3/uL (ref 1.7–7.7)
Neutrophils Relative %: 73 %
Platelets: 223 10*3/uL (ref 150–400)
RBC: 4.54 MIL/uL (ref 3.87–5.11)
RDW: 14.4 % (ref 11.5–15.5)
WBC: 7 10*3/uL (ref 4.0–10.5)
nRBC: 0 % (ref 0.0–0.2)

## 2022-01-27 LAB — COMPREHENSIVE METABOLIC PANEL
ALT: 13 U/L (ref 0–44)
AST: 14 U/L — ABNORMAL LOW (ref 15–41)
Albumin: 3.7 g/dL (ref 3.5–5.0)
Alkaline Phosphatase: 87 U/L (ref 38–126)
Anion gap: 8 (ref 5–15)
BUN: 21 mg/dL (ref 8–23)
CO2: 28 mmol/L (ref 22–32)
Calcium: 9.1 mg/dL (ref 8.9–10.3)
Chloride: 104 mmol/L (ref 98–111)
Creatinine, Ser: 0.93 mg/dL (ref 0.44–1.00)
GFR, Estimated: >60 mL/min (ref >60)
Glucose, Bld: 84 mg/dL (ref 70–99)
Potassium: 4 mmol/L (ref 3.5–5.1)
Sodium: 140 mmol/L (ref 135–145)
Total Bilirubin: 0.4 mg/dL (ref 0.3–1.2)
Total Protein: 7.3 g/dL (ref 6.5–8.1)

## 2022-01-27 LAB — URINALYSIS, ROUTINE W REFLEX MICROSCOPIC
Bilirubin Urine: NEGATIVE
Glucose, UA: >=500 mg/dL — AB
Hgb urine dipstick: NEGATIVE
Ketones, ur: NEGATIVE mg/dL
Nitrite: NEGATIVE
Protein, ur: 30 mg/dL — AB
Specific Gravity, Urine: 1.024 (ref 1.005–1.030)
pH: 5 (ref 5.0–8.0)

## 2022-01-27 LAB — LACTIC ACID, PLASMA
Lactic Acid, Venous: 1.3 mmol/L (ref 0.5–1.9)
Lactic Acid, Venous: 1.3 mmol/L (ref 0.5–1.9)

## 2022-01-27 LAB — TROPONIN I (HIGH SENSITIVITY): Troponin I (High Sensitivity): 8 ng/L (ref <18)

## 2022-01-27 LAB — CBG MONITORING, ED: Glucose-Capillary: 117 mg/dL — ABNORMAL HIGH (ref 70–99)

## 2022-01-27 LAB — BRAIN NATRIURETIC PEPTIDE: B Natriuretic Peptide: 32 pg/mL (ref 0.0–100.0)

## 2022-01-27 MED ORDER — GABAPENTIN 300 MG PO CAPS
300.0000 mg | ORAL_CAPSULE | Freq: Three times a day (TID) | ORAL | Status: DC
  Administered 2022-01-28 – 2022-01-31 (×11): 300 mg via ORAL
  Filled 2022-01-27 (×12): qty 1

## 2022-01-27 MED ORDER — BUPROPION HCL ER (SR) 150 MG PO TB12
150.0000 mg | ORAL_TABLET | Freq: Every day | ORAL | Status: DC
  Administered 2022-01-28 – 2022-01-31 (×4): 150 mg via ORAL
  Filled 2022-01-27 (×8): qty 1

## 2022-01-27 MED ORDER — ATORVASTATIN CALCIUM 40 MG PO TABS
80.0000 mg | ORAL_TABLET | Freq: Every day | ORAL | Status: DC
  Administered 2022-01-28 – 2022-01-31 (×4): 80 mg via ORAL
  Filled 2022-01-27 (×4): qty 2

## 2022-01-27 MED ORDER — ACETAMINOPHEN 325 MG PO TABS
650.0000 mg | ORAL_TABLET | Freq: Four times a day (QID) | ORAL | Status: DC | PRN
  Administered 2022-01-28 – 2022-01-31 (×4): 650 mg via ORAL
  Filled 2022-01-27 (×4): qty 2

## 2022-01-27 MED ORDER — ENOXAPARIN SODIUM 40 MG/0.4ML IJ SOSY
40.0000 mg | PREFILLED_SYRINGE | INTRAMUSCULAR | Status: DC
  Administered 2022-01-28 – 2022-01-29 (×2): 40 mg via SUBCUTANEOUS
  Filled 2022-01-27 (×2): qty 0.4

## 2022-01-27 MED ORDER — SODIUM CHLORIDE 0.9 % IV SOLN: 2.0000 g | INTRAVENOUS | Status: DC

## 2022-01-27 MED ORDER — ONDANSETRON HCL 4 MG PO TABS: 4.0000 mg | ORAL_TABLET | Freq: Four times a day (QID) | ORAL | Status: DC | PRN

## 2022-01-27 MED ORDER — OXYCODONE-ACETAMINOPHEN 10-325 MG PO TABS: 1.0000 | ORAL_TABLET | Freq: Three times a day (TID) | ORAL | Status: DC | PRN

## 2022-01-27 MED ORDER — VANCOMYCIN HCL 2000 MG/400ML IV SOLN
2000.0000 mg | INTRAVENOUS | Status: DC
  Administered 2022-01-27 – 2022-01-28 (×2): 2000 mg via INTRAVENOUS
  Filled 2022-01-27 (×3): qty 400

## 2022-01-27 MED ORDER — ONDANSETRON HCL 4 MG/2ML IJ SOLN: 4.0000 mg | Freq: Four times a day (QID) | INTRAMUSCULAR | Status: DC | PRN

## 2022-01-27 MED ORDER — IOHEXOL 350 MG/ML SOLN
100.0000 mL | Freq: Once | INTRAVENOUS | Status: AC | PRN
  Administered 2022-01-27: 100 mL via INTRAVENOUS

## 2022-01-27 MED ORDER — MELATONIN 3 MG PO TABS
6.0000 mg | ORAL_TABLET | Freq: Every day | ORAL | Status: DC
Start: 2022-01-28 — End: 2022-01-31
  Administered 2022-01-28 – 2022-01-30 (×4): 6 mg via ORAL
  Filled 2022-01-27 (×4): qty 2

## 2022-01-27 MED ORDER — TICAGRELOR 90 MG PO TABS
90.0000 mg | ORAL_TABLET | Freq: Two times a day (BID) | ORAL | Status: DC
  Administered 2022-01-28 – 2022-01-31 (×8): 90 mg via ORAL
  Filled 2022-01-27 (×8): qty 1

## 2022-01-27 MED ORDER — ACETAMINOPHEN 650 MG RE SUPP: 650.0000 mg | Freq: Four times a day (QID) | RECTAL | Status: DC | PRN

## 2022-01-27 MED ORDER — CARVEDILOL 3.125 MG PO TABS
6.2500 mg | ORAL_TABLET | Freq: Two times a day (BID) | ORAL | Status: DC
  Administered 2022-01-28 – 2022-01-31 (×7): 6.25 mg via ORAL
  Filled 2022-01-27 (×8): qty 2

## 2022-01-27 MED ORDER — INSULIN ASPART 100 UNIT/ML IJ SOLN
0.0000 [IU] | Freq: Three times a day (TID) | INTRAMUSCULAR | Status: DC
  Administered 2022-01-29 (×2): 1 [IU] via SUBCUTANEOUS

## 2022-01-27 MED ORDER — VANCOMYCIN HCL 1250 MG/250ML IV SOLN: 1250.0000 mg | Freq: Two times a day (BID) | INTRAVENOUS | Status: DC

## 2022-01-27 MED ORDER — MOMETASONE FURO-FORMOTEROL FUM 200-5 MCG/ACT IN AERO
2.0000 | INHALATION_SPRAY | Freq: Two times a day (BID) | RESPIRATORY_TRACT | Status: DC
  Administered 2022-01-28 – 2022-01-31 (×8): 2 via RESPIRATORY_TRACT
  Filled 2022-01-27 (×2): qty 8.8

## 2022-01-27 MED ORDER — OXYCODONE-ACETAMINOPHEN 5-325 MG PO TABS
2.0000 | ORAL_TABLET | Freq: Once | ORAL | Status: AC
  Administered 2022-01-27: 2 via ORAL
  Filled 2022-01-27: qty 2

## 2022-01-27 MED ORDER — ASPIRIN 81 MG PO TBEC
81.0000 mg | DELAYED_RELEASE_TABLET | Freq: Every day | ORAL | Status: DC
  Administered 2022-01-28 – 2022-01-31 (×4): 81 mg via ORAL
  Filled 2022-01-27 (×4): qty 1

## 2022-01-27 MED ORDER — SODIUM CHLORIDE 0.9 % IV SOLN
2.0000 g | INTRAVENOUS | Status: DC
  Administered 2022-01-28 (×2): 2 g via INTRAVENOUS
  Filled 2022-01-27 (×2): qty 20

## 2022-01-27 MED ORDER — FUROSEMIDE 10 MG/ML IJ SOLN
40.0000 mg | Freq: Two times a day (BID) | INTRAMUSCULAR | Status: DC
  Administered 2022-01-28 – 2022-01-29 (×3): 40 mg via INTRAVENOUS
  Filled 2022-01-27 (×3): qty 4

## 2022-01-27 MED ORDER — IPRATROPIUM-ALBUTEROL 0.5-2.5 (3) MG/3ML IN SOLN
6.0000 mL | Freq: Once | RESPIRATORY_TRACT | Status: AC
  Administered 2022-01-27: 6 mL via RESPIRATORY_TRACT
  Filled 2022-01-27: qty 6

## 2022-01-27 MED ORDER — POLYETHYLENE GLYCOL 3350 17 G PO PACK: 17.0000 g | PACK | Freq: Every day | ORAL | Status: DC | PRN

## 2022-01-27 MED ORDER — INSULIN ASPART 100 UNIT/ML IJ SOLN: 0.0000 [IU] | Freq: Every day | INTRAMUSCULAR | Status: DC

## 2022-01-27 MED ORDER — IPRATROPIUM-ALBUTEROL 0.5-2.5 (3) MG/3ML IN SOLN: 6.0000 mL | RESPIRATORY_TRACT | Status: DC | PRN

## 2022-01-27 MED ORDER — IPRATROPIUM-ALBUTEROL 0.5-2.5 (3) MG/3ML IN SOLN: 3.0000 mL | Freq: Three times a day (TID) | RESPIRATORY_TRACT | Status: DC | PRN

## 2022-01-27 MED ORDER — GUAIFENESIN-DM 100-10 MG/5ML PO SYRP: 10.0000 mL | ORAL_SOLUTION | Freq: Four times a day (QID) | ORAL | Status: DC | PRN

## 2022-01-27 MED ORDER — SPIRONOLACTONE 12.5 MG HALF TABLET
12.5000 mg | ORAL_TABLET | Freq: Every day | ORAL | Status: DC
Start: 2022-01-28 — End: 2022-01-29
  Administered 2022-01-28 – 2022-01-29 (×2): 12.5 mg via ORAL
  Filled 2022-01-27 (×2): qty 1

## 2022-01-27 MED ORDER — DULOXETINE HCL 60 MG PO CPEP
60.0000 mg | ORAL_CAPSULE | Freq: Two times a day (BID) | ORAL | Status: DC
  Administered 2022-01-28 – 2022-01-31 (×8): 60 mg via ORAL
  Filled 2022-01-27 (×3): qty 1
  Filled 2022-01-27: qty 2
  Filled 2022-01-27 (×4): qty 1

## 2022-01-27 NOTE — Assessment & Plan Note (Signed)
Resume Cymbalta 

## 2022-01-27 NOTE — ED Notes (Signed)
Pt requested something for pain, requested, awaiting orders

## 2022-01-27 NOTE — Assessment & Plan Note (Addendum)
Cardiac cath 03/2021, with placement of DES stent-for first marginal lesion 80% stenosis.  EKG today shows some T wave changes.  She denies chest pain -Trend troponin -Updated limited echo -Resume Brilinta, aspirin, statins, carvedilol -Hold lisinopril for now with contrast exposure

## 2022-01-27 NOTE — ED Notes (Signed)
Pt care taken, pt resting complaining of leg pain

## 2022-01-27 NOTE — Progress Notes (Signed)
Pharmacy Antibiotic Note  Marie West is a 66 y.o. female admitted on 01/27/2022 with cellulitis.  Pharmacy has been consulted for vancomycin dosing.  Plan: Vancomycin 2000 IV every 24 hours.  Goal AUC 400-550 mcg*hr/mL eAUC: 430 mcg*hr/mL Scr utilized: 0.93 mg/dL  Height: 5\' 1"  (154.9 cm) Weight: 135.2 kg (298 lb) IBW/kg (Calculated) : 47.8  Temp (24hrs), Avg:98.2 F (36.8 C), Min:98.2 F (36.8 C), Max:98.2 F (36.8 C)  Recent Labs  Lab 01/27/22 1551 01/27/22 1754  WBC 7.0  --   CREATININE 0.93  --   LATICACIDVEN 1.3 1.3    Estimated Creatinine Clearance: 77.8 mL/min (by C-G formula based on SCr of 0.93 mg/dL).    Allergies  Allergen Reactions   Bee Venom Swelling    Antimicrobials this admission: vanc 8/10 >>    Microbiology results: None ordered or resulted at this time  Thank you for allowing pharmacy to be a part of this patient's care.  03/29/22 BS, PharmD, BCPS Clinical Pharmacist 01/27/2022 8:23 PM  Contact: [phone] after 3 PM  "Be curious, not judgmental..." -[phone]

## 2022-01-27 NOTE — Assessment & Plan Note (Addendum)
Bilateral lower extremity swelling, BNP 32, improved compared to prior.  Initial chest x-ray suggesting mild edema, subsequent CTA chest-Limited study no PE, mild global cardiomegaly extensive multivessel coronary artery calcification.  - Last Echo 03/2021, EF of 40 to 45% with grade 1 diastolic dysfunction. -Obtain limited updated Echo -IV Lasix 40 twice daily for now -Strict input output, daily weights, daily BMP -EKG with some T wave changes, check troponin x 2 -Trend creatinine, with some Vanc exposure, diuretics and contrast

## 2022-01-27 NOTE — ED Provider Notes (Signed)
Medical screening examination/treatment/procedure(s) were conducted as a shared visit with non-physician practitioner(s) and myself.  I personally evaluated the patient during the encounter.  Clinical Impression:   Final diagnoses:  None    Patient is an obese 66 year old female with bilateral leg swelling and erythema and several lesions that appear to be overtly infected with some drainage and purulence.  She has pitting edema, tenderness, warmth to the bilateral legs that looks consistent with a cellulitis.  Thankfully ultrasound negative for DVT.  She does not have a tachycardia but has some intermittent hypoxia so CT angiogram will be ordered.  BNP was low but with the patient's size she may have a normal BNP and still have CHF.  Will need to admit with antibiotics     Eber Hong, MD 01/30/22 [phone]

## 2022-01-27 NOTE — Assessment & Plan Note (Signed)
Bilateral lower extremity cellulitis, open and draining purulent wound to right lower extremity.  Rules out for sepsis at this time.  WBC 7.  Bilateral venous Dopplers negative for DVT. -Continue IV vancomycin, will add IV ceftriaxone, as she is diabetic, and for possible UTI

## 2022-01-27 NOTE — Assessment & Plan Note (Addendum)
Stable. -Resume Coreg, spironolactone -Hold lisinopril for now with contrast exposure

## 2022-01-27 NOTE — Assessment & Plan Note (Signed)
BMI 56.3.

## 2022-01-27 NOTE — H&P (Signed)
History and Physical    Marie West:[id number] DOB: [birth year] DOA: 01/27/2022  PCP: Waldon Reining, MD   Patient coming from: Home  I have personally briefly reviewed patient's old medical records in [facility]  Chief Complaint: Leg swelling and redness  HPI: Marie West is a 66 y.o. female with medical history significant for diastolic CHF, DM, obesity, hypertension, COPD. Patient presented to the ED with complaints of bilateral lower extremity swelling over the past month, and over the past 2 to 3 weeks, she developed a wound to her right lower extremity yesterday draining.  She reports increasing pain and swelling since then, with worsening redness of her lower extremities.  No fevers no chills.  She reports her bilateral lower extremity is also swollen she is unable to lift them, also reports abdominal bloating. Patient also reports progressive difficulty breathing worse when she lies down and with exertion.  No chest pain.  She reports a very mild cough. She denies dysuria, but reports chronic and unchanged urinary frequency and urgency.  ED Course: Temperature 98.2.  Heart rate  66-84.  Blood pressure systolic 130-147.  O2 sats 94 to 100% on room air, readings of 86% on file where reportedly erroneous. BNP 32.  Lactic acid 1.3.  UA with small leukocytes.  Lower extremity Dopplers negative for DVT. CTA chest-technically limited study, but without evidence of large PE, cannot definitely exclude presence of small PE.  Extensive coronary artery calcification, cardiomegaly.   IV vancomycin started in ED for cellulitis. DuoNebs given. Hospitalist to admit for cellulitis, and volume overload.  Review of Systems: As per HPI all other systems reviewed and negative.  Past Medical History:  Diagnosis Date   Acute respiratory failure (HCC)    Arthritis    Bronchitis    CHF (congestive heart failure) (HCC)    COPD with exacerbation (HCC)    Fibromyalgia     Headache(784.0)    Hypertension    Hypertensive urgency    Hyperthyroidism    MI (myocardial infarction) (HCC)    MRSA infection    Pneumonia    Retinal detachment    Sciatic leg pain    Shortness of breath     Past Surgical History:  Procedure Laterality Date   CORONARY STENT INTERVENTION N/A 04/05/2021   Procedure: CORONARY STENT INTERVENTION;  Surgeon: Orbie Pyo, MD;  Location: [facility];  Service: Cardiovascular;  Laterality: N/A;   KNEE SURGERY     LEFT HEART CATH AND CORONARY ANGIOGRAPHY N/A 04/05/2021   Procedure: LEFT HEART CATH AND CORONARY ANGIOGRAPHY;  Surgeon: Orbie Pyo, MD;  Location: [facility] CV [facility];  Service: Cardiovascular;  Laterality: N/A;   TUBAL LIGATION       reports that she has been smoking cigarettes. She started smoking about 50 years ago. She has a 18.00 pack-year smoking history. She has never used smokeless tobacco. She reports current alcohol use. She reports that she does not use drugs.  Allergies  Allergen Reactions   Bee Venom Swelling    Family History  Problem Relation Age of Onset   Diabetes Mother    Heart disease Mother        died AMI, age 31   Heart disease Brother    Diabetes Sister     Prior to Admission medications   Medication Sig Start Date End Date Taking? Authorizing Provider  acetaminophen (TYLENOL) 325 MG tablet Take 2 tablets (650 mg total) by mouth every 6 (six) hours  as needed for mild pain, fever or headache (or Fever >/= 101). 08/01/20  Yes Anjalee Cope, Courage, MD  albuterol (VENTOLIN HFA) 108 (90 Base) MCG/ACT inhaler Inhale 2 puffs into the lungs every 6 (six) hours as needed for wheezing or shortness of breath. 08/01/20  Yes Duard Spiewak, Courage, MD  amitriptyline (ELAVIL) 25 MG tablet Take 25 mg by mouth at bedtime as needed for sleep. 08/24/20  Yes [provider]  aspirin EC 81 MG tablet Take 1 tablet (81 mg total) by mouth daily with breakfast. 08/01/20  Yes Vernal Rutan, Courage, MD   atorvastatin (LIPITOR) 80 MG tablet Take 1 tablet (80 mg total) by mouth daily. 08/09/21  Yes BranchDorothe Pea, MD  budesonide-formoterol [facility]) 160-4.5 MCG/ACT inhaler Inhale 2 puffs into the lungs 2 (two) times daily. 08/01/20  Yes Taleya Whitcher, Courage, MD  buPROPion (ZYBAN) 150 MG 12 hr tablet Take 150 mg by mouth daily. 04/01/21  Yes [provider]  carvedilol (COREG) 6.25 MG tablet Take 1 tablet (6.25 mg total) by mouth 2 (two) times daily with a meal. 08/09/21  Yes Branch, Dorothe Pea, MD  cholecalciferol (VITAMIN D3) 25 MCG (1000 UNIT) tablet Take 1,000 Units by mouth daily.   Yes [provider]  dapagliflozin propanediol (FARXIGA) 10 MG TABS tablet Take 1 tablet (10 mg total) by mouth daily. 08/09/21  Yes BranchDorothe Pea, MD  DULoxetine (CYMBALTA) 60 MG capsule Take 60 mg by mouth 2 (two) times daily.   Yes [provider]  gabapentin (NEURONTIN) 300 MG capsule Take 300 mg by mouth 3 (three) times daily. 03/23/21  Yes [provider]  ibuprofen (ADVIL) 200 MG tablet Take 800 mg by mouth every 6 (six) hours as needed for moderate pain.   Yes [provider]  levothyroxine (EUTHYROX) 50 MCG tablet Take 50 mcg by mouth daily before breakfast.   Yes [provider]  lisinopril (ZESTRIL) 20 MG tablet Take 20 mg by mouth daily. 12/12/21  Yes [provider]  loratadine (CLARITIN) 10 MG tablet Take 10 mg by mouth daily.   Yes [provider]  Melatonin 10 MG CAPS Take 10 mg by mouth at bedtime.   Yes [provider]  metFORMIN (GLUCOPHAGE) 500 MG tablet Take 1 tablet (500 mg total) by mouth daily with breakfast. 08/01/20  Yes Sheyna Pettibone, Courage, MD  oxyCODONE-acetaminophen (PERCOCET) 10-325 MG tablet Take 1 tablet by mouth 4 (four) times daily as needed. 01/11/22  Yes [provider]  spironolactone (ALDACTONE) 25 MG tablet Take 1/2 tablet (12.5 mg total) by mouth daily. 08/09/21  Yes Branch, Dorothe Pea, MD  SSD  1 % cream Apply 1 Application topically 3 (three) times daily as needed (Foot). 12/14/21  Yes [provider]  ticagrelor (BRILINTA) 90 MG TABS tablet Take 1 tablet (90 mg total) by mouth 2 (two) times daily. 08/09/21  Yes BranchDorothe Pea, MD  vitamin B-12 (CYANOCOBALAMIN) 1000 MCG tablet Take 1,000 mcg by mouth daily.   Yes [provider]  lisinopril-hydrochlorothiazide (ZESTORETIC) 20-12.5 MG tablet Take 1 tablet by mouth daily. Patient not taking: Reported on 01/27/2022 08/26/21   [provider]    Physical Exam: Vitals:   01/27/22 1700 01/27/22 1730 01/27/22 1749 01/27/22 1843  BP: 125/78 113/81  (!) 103/90  Pulse: 69 71  75  Resp: 18     Temp:      TempSrc:      SpO2: 98% 94% 100% 95%  Weight:      Height:  Constitutional: NAD, calm, comfortable Vitals:   01/27/22 1700 01/27/22 1730 01/27/22 1749 01/27/22 1843  BP: 125/78 113/81  (!) 103/90  Pulse: 69 71  75  Resp: 18     Temp:      TempSrc:      SpO2: 98% 94% 100% 95%  Weight:      Height:       Eyes: PERRL, lids and conjunctivae normal ENMT: Mucous membranes are moist. Neck: normal, supple, no masses, no thyromegaly Respiratory: Diffuse faint expiratory wheezing, but Normal respiratory effort. No accessory muscle use.  Cardiovascular: Regular rate and rhythm, no murmurs / rubs / gallops.  Bilateral lower extremity are quite firm on deep palpation,with at least 1+ pitting bilateral lower extremity  edema.  Lower extremities warm. Abdomen: no tenderness, no masses palpated. No hepatosplenomegaly. Bowel sounds positive.  Musculoskeletal: no clubbing / cyanosis. No joint deformity upper and lower extremities. Good ROM, no contractures. Normal muscle tone.  Skin: Erythema to bilateral lower extremity, no appreciable significant differential warmth, mild tenderness to palpation , draining wound measuring about 3 cm across on right lower extremity, also similar wound to left lower extremity, but  not draining. Neurologic: No apparent cranial abnormality, malaise Smitty spontaneously.  Psychiatric: Normal judgment and insight. Alert and oriented x 3. Normal mood.      Labs on Admission: I have personally reviewed following labs and imaging studies  CBC: Recent Labs  Lab 01/27/22 1551  WBC 7.0  NEUTROABS 5.1  HGB 13.0  HCT 41.6  MCV 91.6  PLT [id number]   Basic Metabolic Panel: Recent Labs  Lab 01/27/22 1551  NA 140  K 4.0  CL 104  CO2 28  GLUCOSE 84  BUN 21  CREATININE 0.93  CALCIUM 9.1   Liver Function Tests: Recent Labs  Lab 01/27/22 1551  AST 14*  ALT 13  ALKPHOS 87  BILITOT 0.4  PROT 7.3  ALBUMIN 3.7   Urine analysis:    Component Value Date/Time   COLORURINE YELLOW 01/27/2022 1815   APPEARANCEUR HAZY (A) 01/27/2022 1815   LABSPEC 1.024 01/27/2022 1815   PHURINE 5.0 01/27/2022 1815   GLUCOSEU >=500 (A) 01/27/2022 1815   HGBUR NEGATIVE 01/27/2022 1815   BILIRUBINUR NEGATIVE 01/27/2022 1815   KETONESUR NEGATIVE 01/27/2022 1815   PROTEINUR 30 (A) 01/27/2022 1815   UROBILINOGEN 0.2 06/01/2012 1919   NITRITE NEGATIVE 01/27/2022 1815   LEUKOCYTESUR SMALL (A) 01/27/2022 1815    Radiological Exams on Admission: US Venous Img Lower Bilateral  Result Date: 01/27/2022 CLINICAL DATA:  Shortness of breath with cough, bilateral lower extremity swelling. EXAM: BILATERAL LOWER EXTREMITY VENOUS DOPPLER ULTRASOUND TECHNIQUE: Gray-scale sonography with graded compression, as well as color Doppler and duplex ultrasound were performed to evaluate the lower extremity deep venous systems from the level of the common femoral vein and including the common femoral, femoral, profunda femoral, popliteal and calf veins including the posterior tibial, peroneal and gastrocnemius veins when visible. The superficial great saphenous vein was also interrogated. Spectral Doppler was utilized to evaluate flow at rest and with distal augmentation maneuvers in the common femoral, femoral  and popliteal veins. COMPARISON:  None Available. FINDINGS: RIGHT LOWER EXTREMITY Common Femoral Vein: No evidence of thrombus. Normal compressibility, respiratory phasicity and response to augmentation. Saphenofemoral Junction: No evidence of thrombus. Normal compressibility and flow on color Doppler imaging. Profunda Femoral Vein: No evidence of thrombus. Normal compressibility and flow on color Doppler imaging. Femoral Vein: No evidence of thrombus. Normal compressibility, respiratory phasicity and response  to augmentation. Popliteal Vein: No evidence of thrombus. Normal compressibility, respiratory phasicity and response to augmentation. Calf Veins: No evidence of thrombus. Normal compressibility and flow on color Doppler imaging. Superficial Great Saphenous Vein: No evidence of thrombus. Normal compressibility. Venous Reflux:  None. Other Findings:  None. LEFT LOWER EXTREMITY Common Femoral Vein: No evidence of thrombus. Normal compressibility, respiratory phasicity and response to augmentation. Saphenofemoral Junction: No evidence of thrombus. Normal compressibility and flow on color Doppler imaging. Profunda Femoral Vein: No evidence of thrombus. Normal compressibility and flow on color Doppler imaging. Femoral Vein: No evidence of thrombus. Normal compressibility, respiratory phasicity and response to augmentation. Popliteal Vein: No evidence of thrombus. Normal compressibility, respiratory phasicity and response to augmentation. Calf Veins: No evidence of thrombus. Normal compressibility and flow on color Doppler imaging. Superficial Great Saphenous Vein: No evidence of thrombus. Normal compressibility. Venous Reflux:  None. Other Findings:  None. IMPRESSION: No evidence of deep venous thrombosis in either lower extremity. Electronically Signed   By: Romona Curlsyler  Litton M.D.   On: 01/27/2022 17:39   DG Chest Portable 1 View  Result Date: 01/27/2022 CLINICAL DATA:  Productive cough.  Shortness of breath. EXAM:  PORTABLE CHEST 1 VIEW COMPARISON:  One-view chest x-ray 07/30/2020 FINDINGS: Heart is enlarged, exaggerated by low lung volumes. Mild edema is present. No focal airspace consolidation is present. Advanced degenerative changes are noted in both shoulders. IMPRESSION: Cardiomegaly and mild edema compatible with congestive heart failure. Electronically Signed   By: Marin Robertshristopher  Mattern M.D.   On: 01/27/2022 15:52    EKG: Independently reviewed.  Sinus rhythm rate 67, QTc 458.  T wave changes to lead aVL, V2, V4 through V6.  Assessment/Plan Principal Problem:   Cellulitis Active Problems:   Morbid obesity (HCC)   Hypertension   Non-insulin dependent type 2 diabetes mellitus (HCC)   Hypothyroidism   Acute diastolic CHF (congestive heart failure) (HCC)   COPD (chronic obstructive pulmonary disease) (HCC)   CAD (coronary artery disease)    Assessment and Plan: * Cellulitis Bilateral lower extremity cellulitis, open and draining purulent wound to right lower extremity.  Rules out for sepsis at this time.  WBC 7.  Bilateral venous Dopplers negative for DVT. -Continue IV vancomycin, will add IV ceftriaxone, as she is diabetic, and for possible UTI  Acute diastolic CHF (congestive heart failure) (HCC) Bilateral lower extremity swelling, BNP 32, improved compared to prior.  Initial chest x-ray suggesting mild edema, subsequent CTA chest-Limited study no PE, mild global cardiomegaly extensive multivessel coronary artery calcification.  - Last Echo 03/2021, EF of 40 to 45% with grade 1 diastolic dysfunction. -Obtain limited updated Echo -IV Lasix 40 twice daily for now -Strict input output, daily weights, daily BMP -EKG with some T wave changes, check troponin x 2 -Trend creatinine, with some Vanc exposure, diuretics and contrast  UTI (urinary tract infection) UA with small leukocytes rare bacteria, she reports urinary frequency, without dysuria.  Afebrile without leukocytosis.  Rules out for  sepsis at this time. -IV ceftriaxone 2 g daily - Add-on urine cultures  CAD (coronary artery disease) Cardiac cath 03/2021, with placement of DES stent-for first marginal lesion 80% stenosis.  EKG today shows some T wave changes.  She denies chest pain -Trend troponin -Updated limited echo -Resume Brilinta, aspirin, statins, carvedilol -Hold lisinopril for now with contrast exposure   COPD (chronic obstructive pulmonary disease) (HCC) Faint expiratory wheezing.  Mild cough, reports orthopnea and dyspnea with exertion. -DuoNebs as needed and scheduled for now -Mucolytic's as  needed -Hold off on steroids at this time, pending clinical course   Non-insulin dependent type 2 diabetes mellitus ([place]) Random glucose 84. - SSI- S - HgbA1c -Hold Farxiga and metformin  Hypertension Stable. -Resume Coreg, spironolactone -Hold lisinopril for now with contrast exposure  Morbid obesity ([place]) BMI 56.3.  Depression Resume Cymbalta   DVT prophylaxis: Lovenox Code Status: Full Code Family Communication: none at bedside Disposition Plan: ~/> 2 days Consults called: None Admission status: Obs tele  I certify that at the point of admission it is my clinical judgment that the patient will require inpatient hospital care spanning beyond 2 midnights from the point of admission due to high intensity of service, high risk for further deterioration and high frequency of surveillance required.    Author: Bethena Roys, MD 01/27/2022 10:30 PM  For on call review [email or web address].

## 2022-01-27 NOTE — ED Provider Notes (Signed)
[facility] EMERGENCY DEPARTMENT Provider Note   CSN: [id number] Arrival date & time: 01/27/22  1423     History  Chief Complaint  Patient presents with   Shortness of Breath   Leg Swelling    Marie West is a 66 y.o. female.  66 year old female with past medical history significant for COPD, CHF, MI, hypertension presents today for evaluation of bilateral lower extremity swelling, redness, warmth ongoing for about 1 month and progressively worsening.  She states she initially showed this to her pain management doctor who prescribed her a topical ointment.  She states she received a second prescription after she ran out of the initial ointment about 1.5 to 2 weeks later.  States has been worsening since then.  Endorses increased productive cough recently but denies significant shortness of breath.  Denies chest pain.  Reports compliance with all of her home medications.   The history is provided by the patient. No language interpreter was used.       Home Medications Prior to Admission medications   Medication Sig Start Date End Date Taking? Authorizing Provider  acetaminophen (TYLENOL) 325 MG tablet Take 2 tablets (650 mg total) by mouth every 6 (six) hours as needed for mild pain, fever or headache (or Fever >/= 101). 08/01/20   Shon Hale, MD  albuterol (VENTOLIN HFA) 108 (90 Base) MCG/ACT inhaler Inhale 2 puffs into the lungs every 6 (six) hours as needed for wheezing or shortness of breath. 08/01/20   Shon Hale, MD  amitriptyline (ELAVIL) 25 MG tablet Take 25 mg by mouth at bedtime as needed for sleep. 08/24/20   [provider]  aspirin EC 81 MG tablet Take 1 tablet (81 mg total) by mouth daily with breakfast. 08/01/20   Shon Hale, MD  atorvastatin (LIPITOR) 80 MG tablet Take 1 tablet (80 mg total) by mouth daily. 08/09/21   Antoine Poche, MD  budesonide-formoterol (SYMBICORT) 160-4.5 MCG/ACT inhaler Inhale 2 puffs into the lungs 2 (two) times  daily. 08/01/20   Shon Hale, MD  buPROPion (ZYBAN) 150 MG 12 hr tablet Take 150 mg by mouth daily. 04/01/21   [provider]  carvedilol (COREG) 6.25 MG tablet Take 1 tablet (6.25 mg total) by mouth 2 (two) times daily with a meal. 08/09/21   Branch, Dorothe Pea, MD  cholecalciferol (VITAMIN D3) 25 MCG (1000 UNIT) tablet Take 1,000 Units by mouth daily.    [provider]  dapagliflozin propanediol (FARXIGA) 10 MG TABS tablet Take 1 tablet (10 mg total) by mouth daily. 08/09/21   Antoine Poche, MD  DULoxetine (CYMBALTA) 60 MG capsule Take 60 mg by mouth daily. Reported on 11/17/2015    [provider]  furosemide (LASIX) 40 MG tablet Take 1 tablet (40 mg total) by mouth daily. 04/07/21   Arty Baumgartner, NP  gabapentin (NEURONTIN) 300 MG capsule Take 300 mg by mouth 3 (three) times daily. 03/23/21   [provider]  levothyroxine (SYNTHROID, LEVOTHROID) 50 MCG tablet Take 50 mcg by mouth daily before breakfast.    [provider]  loratadine (CLARITIN) 10 MG tablet Take 10 mg by mouth daily.    [provider]  Melatonin 10 MG CAPS Take 10 mg by mouth at bedtime.    [provider]  metFORMIN (GLUCOPHAGE) 500 MG tablet Take 1 tablet (500 mg total) by mouth daily with breakfast. 08/01/20   Shon Hale, MD  oxyCODONE-acetaminophen (PERCOCET/ROXICET) 5-325 MG tablet Take 1-2 tablets by mouth every 8 (  eight) hours as needed for severe pain. 09/16/16   Mesner, Barbara Cower, MD  spironolactone (ALDACTONE) 25 MG tablet Take 1/2 tablet (12.5 mg total) by mouth daily. 08/09/21   Antoine Poche, MD  ticagrelor (BRILINTA) 90 MG TABS tablet Take 1 tablet (90 mg total) by mouth 2 (two) times daily. 08/09/21   Antoine Poche, MD  vitamin B-12 (CYANOCOBALAMIN) 1000 MCG tablet Take 1,000 mcg by mouth daily.    [provider]  ZETIA 10 MG tablet Take 10 mg by mouth daily. 09/02/16   [provider]      Allergies    Bee  venom    Review of Systems   Review of Systems  Constitutional:  Positive for chills. Negative for fever.  Respiratory:  Positive for cough and shortness of breath.   Cardiovascular:  Negative for chest pain.  Gastrointestinal:  Negative for abdominal pain, nausea and vomiting.  Genitourinary:  Negative for dysuria.  Neurological:  Negative for syncope and light-headedness.  All other systems reviewed and are negative.   Physical Exam Updated Vital Signs BP (!) 147/62   Temp 98.2 F (36.8 C) (Oral)   Ht 5\' 1"  (1.549 m)   Wt 135.2 kg   SpO2 94%   BMI 56.31 kg/m  Physical Exam Vitals and nursing note reviewed.  Constitutional:      General: She is not in acute distress.    Appearance: Normal appearance. She is obese. She is not ill-appearing.  HENT:     Head: Normocephalic and atraumatic.     Nose: Nose normal.  Eyes:     Conjunctiva/sclera: Conjunctivae normal.  Cardiovascular:     Rate and Rhythm: Normal rate and regular rhythm.  Pulmonary:     Effort: Pulmonary effort is normal. No respiratory distress.     Breath sounds: Rales (Bibasilar rales) present. No wheezing.  Abdominal:     General: There is no distension.     Palpations: Abdomen is soft.     Tenderness: There is no abdominal tenderness. There is no guarding.  Musculoskeletal:        General: No deformity.     Right lower leg: Edema present.     Left lower leg: Edema present.  Skin:    Findings: No rash.  Neurological:     Mental Status: She is alert.     ED Results / Procedures / Treatments   Labs (all labs ordered are listed, but only abnormal results are displayed) Labs Reviewed - No data to display  EKG None  Radiology No results found.  Procedures Procedures    Medications Ordered in ED Medications - No data to display  ED Course/ Medical Decision Making/ A&P Clinical Course as of 01/27/22 1841  Thu Jan 27, 2022  1758 CBC without leukocytosis or anemia.  CMP unremarkable.   Without lactic acidosis.  BNP normal.  Chest x-ray does show signs of vascular congestion consistent with CHF.  Patient did have faint wheezing on auscultation.  DVT study negative for DVTs bilaterally.  Given erythema, warmth in bilateral lower extremities without elevated BNP and negative DVT will cover patient for cellulitis.  Will obtain CTA chest to rule out PE, and further evaluate for pulmonary edema or pneumonia. [AA]    Clinical Course User Index [AA] Jan 29, 2022, PA-C                           Medical Decision Making Amount and/or Complexity of  Data Reviewed Labs: ordered. Radiology: ordered.  Risk Prescription drug management.   Medical Decision Making / ED Course   This patient presents to the ED for concern of lower extremity swelling and redness, shortness of breath, this involves an extensive number of treatment options, and is a complaint that carries with it a high risk of complications and morbidity.  The differential diagnosis includes ACS, PE, pneumonia, cellulitis, DVT,  MDM: 66 year old female presents today for evaluation of bilateral lower extremity swelling, redness, warmth, pain, and mild shortness of breath today, and productive cough over the past few days.  Does have history of COPD, CHF.  Will obtain blood work including BNP, EKG, chest x-ray, and obtain bilateral lower extremity DVT studies.  Patient is low risk for PE.  Without hypoxia, tachycardia, tachypnea within the emergency room.  Initial saturation of 86% after discussion with nurse was an accurate and should not have been verified.  Patient upon arrival was 96% on room air.  Given she is without chest pain, or EKG changes low suspicion for ACS. Will start patient on IV antibiotics for purulent cellulitis.  There is an area on the right lower extremity with purulent drainage.  CTA chest pending.  Vancomycin ordered. Discussed with hospitalist regarding admission.  Hospitalist will evaluate patient for  admission.     Lab Tests: -I ordered, reviewed, and interpreted labs.   The pertinent results include:   Labs Reviewed  COMPREHENSIVE METABOLIC PANEL - Abnormal; Notable for the following components:      Result Value   AST 14 (*)    All other components within normal limits  CBC WITH DIFFERENTIAL/PLATELET  LACTIC ACID, PLASMA  BRAIN NATRIURETIC PEPTIDE  URINALYSIS, ROUTINE W REFLEX MICROSCOPIC  LACTIC ACID, PLASMA      EKG  EKG Interpretation  Date/Time:    Ventricular Rate:    PR Interval:    QRS Duration:   QT Interval:    QTC Calculation:   R Axis:     Text Interpretation:           Imaging Studies ordered: I ordered imaging studies including chest x-ray I independently visualized and interpreted imaging. I agree with the radiologist interpretation   Medicines ordered and prescription drug management: Meds ordered this encounter  Medications   oxyCODONE-acetaminophen (PERCOCET/ROXICET) 5-325 MG per tablet 2 tablet    -I have reviewed the patients home medicines and have made adjustments as needed  Cardiac Monitoring: The patient was maintained on a cardiac monitor.  I personally viewed and interpreted the cardiac monitored which showed an underlying rhythm of: Normal sinus rhythm  Reevaluation: After the interventions noted above, I reevaluated the patient and found that they have :stayed the same  Co morbidities that complicate the patient evaluation  Past Medical History:  Diagnosis Date   Acute respiratory failure (HCC)    Arthritis    Bronchitis    CHF (congestive heart failure) (HCC)    COPD with exacerbation (HCC)    Fibromyalgia    Headache(784.0)    Hypertension    Hypertensive urgency    Hyperthyroidism    MI (myocardial infarction) (HCC)    MRSA infection    Pneumonia    Retinal detachment    Sciatic leg pain    Shortness of breath       Dispostion: Discussed with hospitalist will evaluate patient for admission    Final  Clinical Impression(s) / ED Diagnoses Final diagnoses:  Cellulitis of lower extremity, unspecified laterality  Dyspnea, unspecified  type    Rx / DC Orders ED Discharge Orders     None         Marita Kansas, Cordelia Poche 01/27/22 1904    Eber Hong, MD 01/30/22 [phone]

## 2022-01-27 NOTE — Assessment & Plan Note (Signed)
Random glucose 84. - SSI- S - HgbA1c -Hold Farxiga and metformin

## 2022-01-27 NOTE — ED Triage Notes (Signed)
Patient brought in from home with complaints of SHOB that started today. Sats 93% on RA per EMS. Complaining of bilateral leg swelling with wound to right side that is draining. Legs red. States that she developed left shoulder pain 20 minutes PTA.

## 2022-01-27 NOTE — Assessment & Plan Note (Signed)
UA with small leukocytes rare bacteria, she reports urinary frequency, without dysuria.  Afebrile without leukocytosis.  Rules out for sepsis at this time. -IV ceftriaxone 2 g daily - Add-on urine cultures

## 2022-01-27 NOTE — Assessment & Plan Note (Signed)
Faint expiratory wheezing.  Mild cough, reports orthopnea and dyspnea with exertion. -DuoNebs as needed and scheduled for now -Mucolytic's as needed -Hold off on steroids at this time, pending clinical course

## 2022-01-28 ENCOUNTER — Inpatient Hospital Stay (HOSPITAL_COMMUNITY): Payer: Medicare HMO

## 2022-01-28 ENCOUNTER — Encounter (HOSPITAL_COMMUNITY): Payer: Self-pay | Admitting: Internal Medicine

## 2022-01-28 DIAGNOSIS — L03119 Cellulitis of unspecified part of limb: Secondary | ICD-10-CM | POA: Diagnosis not present

## 2022-01-28 DIAGNOSIS — I5031 Acute diastolic (congestive) heart failure: Secondary | ICD-10-CM

## 2022-01-28 LAB — BASIC METABOLIC PANEL
Anion gap: 9 (ref 5–15)
BUN: 21 mg/dL (ref 8–23)
CO2: 26 mmol/L (ref 22–32)
Calcium: 9.1 mg/dL (ref 8.9–10.3)
Chloride: 104 mmol/L (ref 98–111)
Creatinine, Ser: 0.92 mg/dL (ref 0.44–1.00)
GFR, Estimated: >60 mL/min (ref >60)
Glucose, Bld: 107 mg/dL — ABNORMAL HIGH (ref 70–99)
Potassium: 3.8 mmol/L (ref 3.5–5.1)
Sodium: 139 mmol/L (ref 135–145)

## 2022-01-28 LAB — GLUCOSE, CAPILLARY
Glucose-Capillary: 112 mg/dL — ABNORMAL HIGH (ref 70–99)
Glucose-Capillary: 119 mg/dL — ABNORMAL HIGH (ref 70–99)
Glucose-Capillary: 104 mg/dL — ABNORMAL HIGH (ref 70–99)
Glucose-Capillary: 158 mg/dL — ABNORMAL HIGH (ref 70–99)

## 2022-01-28 LAB — CBC
HCT: 38.9 % (ref 36.0–46.0)
Hemoglobin: 12.3 g/dL (ref 12.0–15.0)
MCH: 28.9 pg (ref 26.0–34.0)
MCHC: 31.6 g/dL (ref 30.0–36.0)
MCV: 91.3 fL (ref 80.0–100.0)
Platelets: 205 10*3/uL (ref 150–400)
RBC: 4.26 MIL/uL (ref 3.87–5.11)
RDW: 14.6 % (ref 11.5–15.5)
WBC: 6.9 10*3/uL (ref 4.0–10.5)
nRBC: 0 % (ref 0.0–0.2)

## 2022-01-28 LAB — HEMOGLOBIN A1C
Hgb A1c MFr Bld: 6 % — ABNORMAL HIGH (ref 4.8–5.6)
Mean Plasma Glucose: 125.5 mg/dL

## 2022-01-28 LAB — CBG MONITORING, ED: Glucose-Capillary: 106 mg/dL — ABNORMAL HIGH (ref 70–99)

## 2022-01-28 LAB — ECHOCARDIOGRAM LIMITED
Height: 61 in
S' Lateral: 3.2 cm
Weight: 4924.19 oz

## 2022-01-28 LAB — HIV ANTIBODY (ROUTINE TESTING W REFLEX): HIV Screen 4th Generation wRfx: NONREACTIVE

## 2022-01-28 MED ORDER — OXYCODONE HCL 5 MG PO TABS
5.0000 mg | ORAL_TABLET | Freq: Three times a day (TID) | ORAL | Status: DC | PRN
  Administered 2022-01-28 – 2022-01-31 (×7): 5 mg via ORAL
  Filled 2022-01-28 (×7): qty 1

## 2022-01-28 MED ORDER — PERFLUTREN LIPID MICROSPHERE
1.0000 mL | INTRAVENOUS | Status: AC | PRN
  Administered 2022-01-28: 6 mL via INTRAVENOUS

## 2022-01-28 MED ORDER — IPRATROPIUM-ALBUTEROL 0.5-2.5 (3) MG/3ML IN SOLN: 3.0000 mL | RESPIRATORY_TRACT | Status: DC | PRN

## 2022-01-28 MED ORDER — OXYCODONE-ACETAMINOPHEN 5-325 MG PO TABS
1.0000 | ORAL_TABLET | Freq: Three times a day (TID) | ORAL | Status: DC | PRN
  Administered 2022-01-28 – 2022-01-31 (×6): 1 via ORAL
  Filled 2022-01-28 (×7): qty 1

## 2022-01-28 MED ORDER — IPRATROPIUM-ALBUTEROL 0.5-2.5 (3) MG/3ML IN SOLN
3.0000 mL | RESPIRATORY_TRACT | Status: DC | PRN
  Filled 2022-01-28: qty 3

## 2022-01-28 NOTE — TOC Initial Note (Signed)
Transition of Care [facility]) - Initial/Assessment Note    Patient Details  Name: Marie West MRN: [id number] Date of Birth: 11/05/1955  [facility]) CM/SW Contact:    Villa Herb, LCSWA Phone Number: 01/28/2022, 1:08 PM  Clinical Narrative:                 [facility] consulted for CHF consult. CSW spoke with pt in room to complete assessment. Pt states that she lives with her daughters. Pts daughters are able to provide some assistance with ADLs if needed. Pt is able to drive if needed. Pt has had HH with [facility] in the past and is interested in these services at D/C. Pt has both a cane and walker to use if needed in the home.   CSW completed CHF screen with pt. Pt states that she does not weigh daily. CSW educated pt on importance of daily weights and when to reach out to doctors. Pt takes medications as prescribed. Pt states that she does not add salt to her food and attempts to follow a heart healthy diet. TOC to follow.   Expected Discharge Plan: Home w Home Health Services Barriers to Discharge: Continued Medical Work up   Patient Goals and CMS Choice Patient states their goals for this hospitalization and ongoing recovery are:: return home with [facility] CMS Medicare.gov Compare Post Acute Care list provided to:: Patient Choice offered to / list presented to : Patient  Expected Discharge Plan and Services Expected Discharge Plan: Home w Home Health Services In-house Referral: Clinical Social Work Discharge Planning Services: CM Consult Post Acute Care Choice: Home Health Living arrangements for the past 2 months: Single Family Home                                      Prior Living Arrangements/Services Living arrangements for the past 2 months: Single Family Home Lives with:: Adult Children Patient language and need for interpreter reviewed:: Yes Do you feel safe going back to the place where you live?: Yes      Need for Family Participation in Patient Care:  Yes (Comment) Care giver support system in place?: Yes (comment) Current home services: DME Criminal Activity/Legal Involvement Pertinent to Current Situation/Hospitalization: No - Comment as needed  Activities of Daily Living      Permission Sought/Granted                  Emotional Assessment Appearance:: Appears stated age Attitude/Demeanor/Rapport: Engaged Affect (typically observed): Accepting Orientation: : Oriented to Self, Oriented to Place, Oriented to  Time, Oriented to Situation Alcohol / Substance Use: Not Applicable Psych Involvement: No (comment)  Admission diagnosis:  Cellulitis [L03.90] Cellulitis of lower extremity, unspecified laterality [L03.119] Dyspnea, unspecified type [R06.00] Patient Active Problem List   Diagnosis Date Noted   Cellulitis 01/27/2022   CAD (coronary artery disease) 01/27/2022   UTI (urinary tract infection) 01/27/2022   Unstable angina pectoris (HCC) 04/05/2021   Pneumonia due to COVID-19 virus 07/30/2020   COPD exacerbation (HCC) 07/29/2020   Acute diastolic CHF (congestive heart failure) (HCC) 07/29/2020   COPD (chronic obstructive pulmonary disease) (HCC) 07/29/2020   Acute dyspnea 11/17/2015   Depression 11/17/2015   Hypertension 11/17/2015   Non-insulin dependent type 2 diabetes mellitus (HCC) 11/17/2015   Hypothyroidism 11/17/2015   Acute exacerbation of chronic obstructive pulmonary disease (COPD) (HCC)    Hypoxia    CAP (community  acquired pneumonia) 07/09/2013   Acute respiratory failure (HCC) 07/09/2013   Pleural effusion 07/09/2013   Hypertensive urgency 07/09/2013   Acute respiratory failure with hypoxia (HCC) 07/09/2013   COPD with exacerbation (HCC) 06/01/2012   Tobacco use 06/01/2012   Morbid obesity (HCC) 06/01/2012   Arthritis 06/01/2012   possible CHF (congestive heart failure) 06/01/2012   PCP:  Waldon Reining, MD Pharmacy:   [facility] [address], [place] - [address] [place] [address] Phone: [phone] Fax: [phone]     Social Determinants of Health (SDOH) Interventions    Readmission Risk Interventions     No data to display

## 2022-01-28 NOTE — ED Notes (Signed)
Gave report to [employer]

## 2022-01-28 NOTE — Progress Notes (Signed)
*  PRELIMINARY RESULTS* Echocardiogram Limited 2D Echocardiogram has been performed.  Marie West 01/28/2022, 1:28 PM

## 2022-01-28 NOTE — Progress Notes (Signed)
PROGRESS NOTE    Marie West  VVO:[id number] DOB: 1955/12/25 DOA: 01/27/2022 PCP: Waldon Reining, MD   Brief Narrative:    Marie West is a 66 y.o. female with medical history significant for diastolic CHF, DM, obesity, hypertension, COPD. Patient presented to the ED with complaints of bilateral lower extremity swelling over the past month, and over the past 2 to 3 weeks, she developed a wound to her right lower extremity yesterday draining.  Patient was admitted with acute diastolic CHF exacerbation in the setting of volume overload with bilateral lower extremity cellulitis.  Assessment & Plan:   Principal Problem:   Cellulitis Active Problems:   Acute diastolic CHF (congestive heart failure) (HCC)   COPD (chronic obstructive pulmonary disease) (HCC)   CAD (coronary artery disease)   UTI (urinary tract infection)   Morbid obesity (HCC)   Hypertension   Non-insulin dependent type 2 diabetes mellitus (HCC)   Depression   Hypothyroidism  Assessment and Plan:   Cellulitis Bilateral lower extremity cellulitis, open and draining purulent wound to right lower extremity.  Rules out for sepsis at this time.  WBC 7.  Bilateral venous Dopplers negative for DVT. -Continue IV vancomycin, will add IV ceftriaxone, as she is diabetic, and for possible UTI -Obtain MRSA PCR   Acute diastolic CHF (congestive heart failure) (HCC) Bilateral lower extremity swelling, BNP 32, improved compared to prior.  Initial chest x-ray suggesting mild edema, subsequent CTA chest-Limited study no PE, mild global cardiomegaly extensive multivessel coronary artery calcification.  - Last Echo 03/2021, EF of 40 to 45% with grade 1 diastolic dysfunction. -Obtain limited updated Echo -IV Lasix 40 twice daily for now with 2 L urine output noted thus far -Strict input output, daily weights, daily BMP -EKG with some T wave changes, check troponin x 2 -Trend creatinine, with some Vanc exposure, diuretics and  contrast   UTI (urinary tract infection) UA with small leukocytes rare bacteria, she reports urinary frequency, without dysuria.  Afebrile without leukocytosis.  Rules out for sepsis at this time. -IV ceftriaxone 2 g daily - Add-on urine cultures, pending   CAD (coronary artery disease) Cardiac cath 03/2021, with placement of DES stent-for first marginal lesion 80% stenosis.  EKG today shows some T wave changes.  She denies chest pain -Trend troponin negative -Updated limited echo -Resume Brilinta, aspirin, statins, carvedilol -Hold lisinopril for now with contrast exposure     COPD (chronic obstructive pulmonary disease) (HCC) Faint expiratory wheezing.  Mild cough, reports orthopnea and dyspnea with exertion. -DuoNebs as needed and scheduled for now -Mucolytic's as needed -Hold off on steroids at this time, pending clinical course     Non-insulin dependent type 2 diabetes mellitus (HCC) Random glucose 84. - SSI- S - HgbA1c 6.0% -Hold Farxiga and metformin   Hypertension Stable. -Resume Coreg, spironolactone -Hold lisinopril for now with contrast exposure   Morbid obesity (HCC) BMI 56.3.   Depression Resume Cymbalta    DVT prophylaxis: Lovenox Code Status: Full Family Communication: None at bedside Disposition Plan:  Status is: Inpatient Remains inpatient appropriate because: Need for IV medications.  Consultants:  None  Procedures:  None  Antimicrobials:  Anti-infectives (From admission, onward)    Start     Dose/Rate Route Frequency Ordered Stop   01/28/22 0000  cefTRIAXone (ROCEPHIN) 2 g in sodium chloride 0.9 % 100 mL IVPB  Status:  Discontinued        2 g 200 mL/hr over 30 Minutes Intravenous Every 24 hours 01/27/22 2357  01/28/22 0000   01/27/22 2230  cefTRIAXone (ROCEPHIN) 2 g in sodium chloride 0.9 % 100 mL IVPB        2 g 200 mL/hr over 30 Minutes Intravenous Every 24 hours 01/27/22 2226     01/27/22 2030  vancomycin (VANCOREADY) IVPB 1250  mg/250 mL  Status:  Discontinued        1,250 mg 166.7 mL/hr over 90 Minutes Intravenous Every 12 hours 01/27/22 2017 01/27/22 2018   01/27/22 2030  vancomycin (VANCOREADY) IVPB 1250 mg/250 mL  Status:  Discontinued        1,250 mg 166.7 mL/hr over 90 Minutes Intravenous Every 12 hours 01/27/22 2018 01/27/22 2020   01/27/22 2030  vancomycin (VANCOREADY) IVPB 2000 mg/400 mL        2,000 mg 200 mL/hr over 120 Minutes Intravenous Every 24 hours 01/27/22 2020         Subjective: Patient seen and evaluated today with ongoing fatigue and swelling noted.  She is having good urine output.  No acute overnight events since admission.  Objective: Vitals:   01/28/22 0400 01/28/22 0655 01/28/22 0714 01/28/22 0909  BP: (!) 146/81  (!) 121/56   Pulse: 73  66   Resp: 14  18   Temp:   98.4 F (36.9 C)   TempSrc:   Oral   SpO2: 96%  97% 90%  Weight:  (!) 139.6 kg    Height:        Intake/Output Summary (Last 24 hours) at 01/28/2022 1159 Last data filed at 01/28/2022 1105 Gross per 24 hour  Intake 400 ml  Output 2000 ml  Net -1600 ml   Filed Weights   01/27/22 1437 01/28/22 0655  Weight: 135.2 kg (!) 139.6 kg    Examination:  General exam: Appears calm and comfortable, morbidly obese Respiratory system: Clear to auscultation. Respiratory effort normal. Cardiovascular system: S1 & S2 heard, RRR.  Gastrointestinal system: Abdomen is soft Central nervous system: Alert and awake Extremities: Lower remedy erythema bilaterally with edema and right lower extremity wound that appears superficial Skin: As noted above Psychiatry: Flat affect.    Data Reviewed: I have personally reviewed following labs and imaging studies  CBC: Recent Labs  Lab 01/27/22 1551 01/28/22 0422  WBC 7.0 6.9  NEUTROABS 5.1  --   HGB 13.0 12.3  HCT 41.6 38.9  MCV 91.6 91.3  PLT 223 205   Basic Metabolic Panel: Recent Labs  Lab 01/27/22 1551 01/28/22 0422  NA 140 139  K 4.0 3.8  CL 104 104  CO2 28  26  GLUCOSE 84 107*  BUN 21 21  CREATININE 0.93 0.92  CALCIUM 9.1 9.1   GFR: Estimated Creatinine Clearance: 80.2 mL/min (by C-G formula based on SCr of 0.92 mg/dL). Liver Function Tests: Recent Labs  Lab 01/27/22 1551  AST 14*  ALT 13  ALKPHOS 87  BILITOT 0.4  PROT 7.3  ALBUMIN 3.7   No results for input(s): "LIPASE", "AMYLASE" in the last 168 hours. No results for input(s): "AMMONIA" in the last 168 hours. Coagulation Profile: No results for input(s): "INR", "PROTIME" in the last 168 hours. Cardiac Enzymes: No results for input(s): "CKTOTAL", "CKMB", "CKMBINDEX", "TROPONINI" in the last 168 hours. BNP (last 3 results) No results for input(s): "PROBNP" in the last 8760 hours. HbA1C: Recent Labs    01/27/22 2211  HGBA1C 6.0*   CBG: Recent Labs  Lab 01/27/22 2223 01/28/22 0023 01/28/22 0748 01/28/22 1156  GLUCAP 117* 106* 112* 119*  Lipid Profile: No results for input(s): "CHOL", "HDL", "LDLCALC", "TRIG", "CHOLHDL", "LDLDIRECT" in the last 72 hours. Thyroid Function Tests: No results for input(s): "TSH", "T4TOTAL", "FREET4", "T3FREE", "THYROIDAB" in the last 72 hours. Anemia Panel: No results for input(s): "VITAMINB12", "FOLATE", "FERRITIN", "TIBC", "IRON", "RETICCTPCT" in the last 72 hours. Sepsis Labs: Recent Labs  Lab 01/27/22 1551 01/27/22 1754  LATICACIDVEN 1.3 1.3    No results found for this or any previous visit (from the past 240 hour(s)).       Radiology Studies: CT Angio Chest PE W and/or Wo Contrast  Result Date: 01/27/2022 CLINICAL DATA:  Pulmonary embolism (PE) suspected, high prob. Dyspnea, leg swelling EXAM: CT ANGIOGRAPHY CHEST WITH CONTRAST TECHNIQUE: Multidetector CT imaging of the chest was performed using the standard protocol during bolus administration of intravenous contrast. Multiplanar CT image reconstructions and MIPs were obtained to evaluate the vascular anatomy. RADIATION DOSE REDUCTION: This exam was performed according  to the departmental dose-optimization program which includes automated exposure control, adjustment of the mA and/or kV according to patient size and/or use of iterative reconstruction technique. CONTRAST:  OMNIPAQUE IOHEXOL 350 MG/ML SOLN COMPARISON:  None Available. FINDINGS: Cardiovascular: There is relatively poor opacification of pulmonary arterial tree due to bolus timing and the examination is adequate only for exclusion of large pulmonary emboli within the main and central right left pulmonary arteries. Lobar and peripheral pulmonary arteries are not adequately opacified to definitively exclude the presence of small pulmonary emboli. The central pulmonary arteries are of normal caliber. Extensive multi-vessel coronary artery calcification. Global cardiac size is mildly enlarged. No pericardial effusion. Mild atherosclerotic calcification within the thoracic aorta. No aortic aneurysm. Mediastinum/Nodes: No enlarged mediastinal, hilar, or axillary lymph nodes. Thyroid gland, trachea, and esophagus demonstrate no significant findings. Lungs/Pleura: 6 mm noncalcified pulmonary nodule within the right lower lobe, axial image # 74/7 and 5 mm noncalcified pulmonary nodule, left upper lobe, axial image # 47/7 are no focal pulmonary infiltrates. No pneumothorax or pleural effusion. Central airways are widely patent. Stable since prior examination and safely considered benign. Multiple pulmonary nodules within the basilar left lower lobe measuring up to 7 mm are new from prior examination and are technically indeterminate. Upper Abdomen: No acute abnormality. Musculoskeletal: Multiple healed right rib fractures are seen. Advanced degenerative changes are seen within the shoulders bilaterally. Degenerative changes are seen within the thoracic spine. No acute bone abnormality. No lytic or blastic bone lesion. Review of the MIP images confirms the above findings. IMPRESSION: 1. Technically limited examination  without evidence of large pulmonary emboli within the main and central right and left pulmonary arteries. Lobar and peripheral pulmonary arteries are not adequately opacified to definitively exclude the presence of small pulmonary emboli. 2. Extensive multi-vessel coronary artery calcification. Mild global cardiomegaly. 3. Multiple pulmonary nodules within the basilar left lower lobe measuring up to 7 mm, new from prior examination and technically indeterminate. Non-contrast chest CT at 3-6 months is recommended. If the nodules are stable at time of repeat CT, then future CT at 18-24 months (from today's scan) is considered optional for low-risk patients, but is recommended for high-risk patients. This recommendation follows the consensus statement: Guidelines for Management of Incidental Pulmonary Nodules Detected on CT Images: From the Fleischner Society 2017; Radiology 2017; 284:228-243. Aortic Atherosclerosis (ICD10-I70.0). Electronically Signed   By: Helyn Numbers M.D.   On: 01/27/2022 19:43   US Venous Img Lower Bilateral  Result Date: 01/27/2022 CLINICAL DATA:  Shortness of breath with cough, bilateral lower extremity  swelling. EXAM: BILATERAL LOWER EXTREMITY VENOUS DOPPLER ULTRASOUND TECHNIQUE: Gray-scale sonography with graded compression, as well as color Doppler and duplex ultrasound were performed to evaluate the lower extremity deep venous systems from the level of the common femoral vein and including the common femoral, femoral, profunda femoral, popliteal and calf veins including the posterior tibial, peroneal and gastrocnemius veins when visible. The superficial great saphenous vein was also interrogated. Spectral Doppler was utilized to evaluate flow at rest and with distal augmentation maneuvers in the common femoral, femoral and popliteal veins. COMPARISON:  None Available. FINDINGS: RIGHT LOWER EXTREMITY Common Femoral Vein: No evidence of thrombus. Normal compressibility, respiratory  phasicity and response to augmentation. Saphenofemoral Junction: No evidence of thrombus. Normal compressibility and flow on color Doppler imaging. Profunda Femoral Vein: No evidence of thrombus. Normal compressibility and flow on color Doppler imaging. Femoral Vein: No evidence of thrombus. Normal compressibility, respiratory phasicity and response to augmentation. Popliteal Vein: No evidence of thrombus. Normal compressibility, respiratory phasicity and response to augmentation. Calf Veins: No evidence of thrombus. Normal compressibility and flow on color Doppler imaging. Superficial Great Saphenous Vein: No evidence of thrombus. Normal compressibility. Venous Reflux:  None. Other Findings:  None. LEFT LOWER EXTREMITY Common Femoral Vein: No evidence of thrombus. Normal compressibility, respiratory phasicity and response to augmentation. Saphenofemoral Junction: No evidence of thrombus. Normal compressibility and flow on color Doppler imaging. Profunda Femoral Vein: No evidence of thrombus. Normal compressibility and flow on color Doppler imaging. Femoral Vein: No evidence of thrombus. Normal compressibility, respiratory phasicity and response to augmentation. Popliteal Vein: No evidence of thrombus. Normal compressibility, respiratory phasicity and response to augmentation. Calf Veins: No evidence of thrombus. Normal compressibility and flow on color Doppler imaging. Superficial Great Saphenous Vein: No evidence of thrombus. Normal compressibility. Venous Reflux:  None. Other Findings:  None. IMPRESSION: No evidence of deep venous thrombosis in either lower extremity. Electronically Signed   By: Zerita Boers M.D.   On: 01/27/2022 17:39   DG Chest Portable 1 View  Result Date: 01/27/2022 CLINICAL DATA:  Productive cough.  Shortness of breath. EXAM: PORTABLE CHEST 1 VIEW COMPARISON:  One-view chest x-ray 07/30/2020 FINDINGS: Heart is enlarged, exaggerated by low lung volumes. Mild edema is present. No focal  airspace consolidation is present. Advanced degenerative changes are noted in both shoulders. IMPRESSION: Cardiomegaly and mild edema compatible with congestive heart failure. Electronically Signed   By: San Morelle M.D.   On: 01/27/2022 15:52        Scheduled Meds:  aspirin EC  81 mg Oral Q breakfast   atorvastatin  80 mg Oral Daily   buPROPion  150 mg Oral Daily   carvedilol  6.25 mg Oral BID WC   DULoxetine  60 mg Oral BID   enoxaparin (LOVENOX) injection  40 mg Subcutaneous Q24H   furosemide  40 mg Intravenous BID   gabapentin  300 mg Oral TID   insulin aspart  0-5 Units Subcutaneous QHS   insulin aspart  0-9 Units Subcutaneous TID WC   melatonin  6 mg Oral QHS   mometasone-formoterol  2 puff Inhalation BID   spironolactone  12.5 mg Oral Daily   ticagrelor  90 mg Oral BID   Continuous Infusions:  cefTRIAXone (ROCEPHIN)  IV Stopped (01/28/22 0254)   vancomycin Stopped (01/27/22 2342)     LOS: 1 day    Time spent: 35 minutes    Jaydrien Wassenaar Darleen Crocker, DO [facility]  If 7PM-7AM, please contact night-coverage [email or web address] 01/28/2022, 11:59 AM

## 2022-01-29 DIAGNOSIS — L03119 Cellulitis of unspecified part of limb: Secondary | ICD-10-CM | POA: Diagnosis not present

## 2022-01-29 LAB — GLUCOSE, CAPILLARY
Glucose-Capillary: 94 mg/dL (ref 70–99)
Glucose-Capillary: 140 mg/dL — ABNORMAL HIGH (ref 70–99)
Glucose-Capillary: 126 mg/dL — ABNORMAL HIGH (ref 70–99)
Glucose-Capillary: 81 mg/dL (ref 70–99)

## 2022-01-29 LAB — CBC
HCT: 40.6 % (ref 36.0–46.0)
Hemoglobin: 12.6 g/dL (ref 12.0–15.0)
MCH: 28.8 pg (ref 26.0–34.0)
MCHC: 31 g/dL (ref 30.0–36.0)
MCV: 92.7 fL (ref 80.0–100.0)
Platelets: 202 10*3/uL (ref 150–400)
RBC: 4.38 MIL/uL (ref 3.87–5.11)
RDW: 14.5 % (ref 11.5–15.5)
WBC: 6.9 10*3/uL (ref 4.0–10.5)
nRBC: 0 % (ref 0.0–0.2)

## 2022-01-29 LAB — BASIC METABOLIC PANEL
Anion gap: 9 (ref 5–15)
BUN: 27 mg/dL — ABNORMAL HIGH (ref 8–23)
CO2: 28 mmol/L (ref 22–32)
Calcium: 8.7 mg/dL — ABNORMAL LOW (ref 8.9–10.3)
Chloride: 103 mmol/L (ref 98–111)
Creatinine, Ser: 1.31 mg/dL — ABNORMAL HIGH (ref 0.44–1.00)
GFR, Estimated: 45 mL/min — ABNORMAL LOW (ref >60)
Glucose, Bld: 102 mg/dL — ABNORMAL HIGH (ref 70–99)
Potassium: 4.2 mmol/L (ref 3.5–5.1)
Sodium: 140 mmol/L (ref 135–145)

## 2022-01-29 LAB — URINE CULTURE

## 2022-01-29 LAB — MAGNESIUM: Magnesium: 1.9 mg/dL (ref 1.7–2.4)

## 2022-01-29 LAB — MRSA NEXT GEN BY PCR, NASAL: MRSA by PCR Next Gen: NOT DETECTED

## 2022-01-29 MED ORDER — HEPARIN SODIUM (PORCINE) 5000 UNIT/ML IJ SOLN
5000.0000 [IU] | Freq: Three times a day (TID) | INTRAMUSCULAR | Status: DC
  Administered 2022-01-29 – 2022-01-31 (×5): 5000 [IU] via SUBCUTANEOUS
  Filled 2022-01-29 (×6): qty 1

## 2022-01-29 MED ORDER — SODIUM CHLORIDE 0.9 % IV SOLN
2.0000 g | INTRAVENOUS | Status: DC
  Administered 2022-01-29 – 2022-01-30 (×2): 2 g via INTRAVENOUS
  Filled 2022-01-29 (×2): qty 20

## 2022-01-29 MED ORDER — SODIUM CHLORIDE 0.9 % IV SOLN: 2.0000 g | INTRAVENOUS | Status: DC

## 2022-01-29 NOTE — Plan of Care (Signed)

## 2022-01-29 NOTE — Progress Notes (Signed)
Patient was requesting pain meds this morning on first round. Explained that she had had her medication recently and I could not provide anything per ordered parameters, however that I could add supplemental tylenol. Patient was agreeable. Other meds given as ordered. Assisted patient with repositioning in bed. Patient is currently resting with resp even and non labored. Eyes closed.

## 2022-01-29 NOTE — Progress Notes (Signed)
Patient reported to nursing that she came to hospital with complaints of R leg weakness and inability to move right leg. She reported that her weakness had gotten worse over the course of one month post fall down some camper steps, with family having helped her back up. Patient message passed along to Dr. Sherryll Burger after chart review. Patient has required pain medication throughout shift for ongoing pain to the right upper leg/hip area.

## 2022-01-29 NOTE — Progress Notes (Addendum)
PROGRESS NOTE    Marie West  [id number] DOB: Sep 14, 1955 DOA: 01/27/2022 PCP: Leonie Douglas, MD   Brief Narrative:    Marie West is a 66 y.o. female with medical history significant for diastolic CHF, DM, obesity, hypertension, COPD. Patient presented to the ED with complaints of bilateral lower extremity swelling over the past month, and over the past 2 to 3 weeks, she developed a wound to her right lower extremity yesterday draining.  Patient was admitted with acute diastolic CHF exacerbation in the setting of volume overload with bilateral lower extremity cellulitis and UTI.  Assessment & Plan:   Principal Problem:   Cellulitis Active Problems:   Acute diastolic CHF (congestive heart failure) (HCC)   COPD (chronic obstructive pulmonary disease) (HCC)   CAD (coronary artery disease)   UTI (urinary tract infection)   Morbid obesity (HCC)   Hypertension   Non-insulin dependent type 2 diabetes mellitus (HCC)   Depression   Hypothyroidism  Assessment and Plan:   Cellulitis Bilateral lower extremity cellulitis, open and draining purulent wound to right lower extremity.  Rules out for sepsis at this time.  WBC 7.  Bilateral venous Dopplers negative for DVT. -Continue IV vancomycin, will add IV ceftriaxone, as she is diabetic, and for possible UTI -Obtain MRSA PCR   Acute diastolic CHF (congestive heart failure) (HCC) Bilateral lower extremity swelling, BNP 32, improved compared to prior.  Initial chest x-ray suggesting mild edema, subsequent CTA chest-Limited study no PE, mild global cardiomegaly extensive multivessel coronary artery calcification.  - Last Echo 03/2021, EF of 40 to 45% with grade 1 diastolic dysfunction. -2D echocardiogram performed 8/11 with LVEF 55-60% -Hold further IV Lasix given creatinine bump -Strict input output, daily weights, daily BMP -EKG with some T wave changes, check troponin x 2 -Trend creatinine, with some Vanc exposure,  diuretics and contrast   UTI (urinary tract infection) UA with small leukocytes rare bacteria, she reports urinary frequency, without dysuria.  Afebrile without leukocytosis.  Rules out for sepsis at this time. -IV ceftriaxone 2 g daily -Urine cultures with multiple species growth, continue total course for 3 days with last day today   CAD (coronary artery disease) Cardiac cath 03/2021, with placement of DES stent-for first marginal lesion 80% stenosis.  EKG today shows some T wave changes.  She denies chest pain -Trend troponin negative -Updated limited echo -Resume Brilinta, aspirin, statins, carvedilol -Hold lisinopril for now with contrast exposure     COPD (chronic obstructive pulmonary disease) (HCC) Faint expiratory wheezing.  Mild cough, reports orthopnea and dyspnea with exertion. -DuoNebs as needed and scheduled for now -Mucolytic's as needed -Hold off on steroids at this time, pending clinical course     Non-insulin dependent type 2 diabetes mellitus ([place]) Random glucose 84. - SSI- S - HgbA1c 6.0% -Hold Farxiga and metformin   Hypertension Stable. -Resume Coreg, spironolactone -Hold lisinopril for now with contrast exposure   Morbid obesity ([place]) BMI 56.3.   Depression Resume Cymbalta  AKI Baseline creatinine 0.9 and now up to 1.3 Hold further Lasix and monitor     DVT prophylaxis: Lovenox Code Status: Full Family Communication: None at bedside Disposition Plan:  Status is: Inpatient Remains inpatient appropriate because: Need for IV medications.   Consultants:  None   Procedures:  None   Antimicrobials:  Anti-infectives (From admission, onward)    Start     Dose/Rate Route Frequency Ordered Stop   01/28/22 0000  cefTRIAXone (ROCEPHIN) 2 g in sodium chloride 0.9 %  100 mL IVPB  Status:  Discontinued        2 g 200 mL/hr over 30 Minutes Intravenous Every 24 hours 01/27/22 2357 01/28/22 0000   01/27/22 2230  cefTRIAXone (ROCEPHIN) 2 g in sodium  chloride 0.9 % 100 mL IVPB        2 g 200 mL/hr over 30 Minutes Intravenous Every 24 hours 01/27/22 2226     01/27/22 2030  vancomycin (VANCOREADY) IVPB 1250 mg/250 mL  Status:  Discontinued        1,250 mg 166.7 mL/hr over 90 Minutes Intravenous Every 12 hours 01/27/22 2017 01/27/22 2018   01/27/22 2030  vancomycin (VANCOREADY) IVPB 1250 mg/250 mL  Status:  Discontinued        1,250 mg 166.7 mL/hr over 90 Minutes Intravenous Every 12 hours 01/27/22 2018 01/27/22 2020   01/27/22 2030  vancomycin (VANCOREADY) IVPB 2000 mg/400 mL  Status:  Discontinued        2,000 mg 200 mL/hr over 120 Minutes Intravenous Every 24 hours 01/27/22 2020 01/29/22 0913       Subjective: Patient seen and evaluated today with some mild lethargy and ongoing lower extremity edema with minimal pain.  She denies any dysuria.  Objective: Vitals:   01/28/22 2001 01/28/22 2304 01/29/22 0506 01/29/22 0714  BP:  (!) 89/55 118/76 116/67  Pulse: 71 60 (!) 58 (!) 57  Resp: 19 14 12 13   Temp:  98 F (36.7 C) 97.6 F (36.4 C) 97.9 F (36.6 C)  TempSrc:    Oral  SpO2: 94% (!) 89% 100% 93%  Weight:    (!) 139.8 kg  Height:        Intake/Output Summary (Last 24 hours) at 01/29/2022 1056 Last data filed at 01/29/2022 1000 Gross per 24 hour  Intake 849.22 ml  Output 2650 ml  Net -1800.78 ml   Filed Weights   01/27/22 1437 01/28/22 0655 01/29/22 0714  Weight: 135.2 kg (!) 139.6 kg (!) 139.8 kg    Examination:  General exam: Appears calm and comfortable, obese  Respiratory system: Clear to auscultation. Respiratory effort normal.  2.5 L nasal cannula Cardiovascular system: S1 & S2 heard, RRR.  Gastrointestinal system: Abdomen is soft Central nervous system: Alert and awake Extremities: Bilateral lower extremity edema with erythema Skin: No significant lesions noted Psychiatry: Flat affect.    Data Reviewed: I have personally reviewed following labs and imaging studies  CBC: Recent Labs  Lab  01/27/22 1551 01/28/22 0422 01/29/22 0626  WBC 7.0 6.9 6.9  NEUTROABS 5.1  --   --   HGB 13.0 12.3 12.6  HCT 41.6 38.9 40.6  MCV 91.6 91.3 92.7  PLT 223 205 [id number]   Basic Metabolic Panel: Recent Labs  Lab 01/27/22 1551 01/28/22 0422 01/29/22 0626  NA 140 139 140  K 4.0 3.8 4.2  CL 104 104 103  CO2 28 26 28   GLUCOSE 84 107* 102*  BUN 21 21 27*  CREATININE 0.93 0.92 1.31*  CALCIUM 9.1 9.1 8.7*  MG  --   --  1.9   GFR: Estimated Creatinine Clearance: 56.4 mL/min (A) (by C-G formula based on SCr of 1.31 mg/dL (H)). Liver Function Tests: Recent Labs  Lab 01/27/22 1551  AST 14*  ALT 13  ALKPHOS 87  BILITOT 0.4  PROT 7.3  ALBUMIN 3.7   No results for input(s): "LIPASE", "AMYLASE" in the last 168 hours. No results for input(s): "AMMONIA" in the last 168 hours. Coagulation Profile: No results for input(s): "  INR", "PROTIME" in the last 168 hours. Cardiac Enzymes: No results for input(s): "CKTOTAL", "CKMB", "CKMBINDEX", "TROPONINI" in the last 168 hours. BNP (last 3 results) No results for input(s): "PROBNP" in the last 8760 hours. HbA1C: Recent Labs    01/27/22 2211  HGBA1C 6.0*   CBG: Recent Labs  Lab 01/28/22 0748 01/28/22 1156 01/28/22 1612 01/28/22 2033 01/29/22 0710  GLUCAP 112* 119* 104* 158* 94   Lipid Profile: No results for input(s): "CHOL", "HDL", "LDLCALC", "TRIG", "CHOLHDL", "LDLDIRECT" in the last 72 hours. Thyroid Function Tests: No results for input(s): "TSH", "T4TOTAL", "FREET4", "T3FREE", "THYROIDAB" in the last 72 hours. Anemia Panel: No results for input(s): "VITAMINB12", "FOLATE", "FERRITIN", "TIBC", "IRON", "RETICCTPCT" in the last 72 hours. Sepsis Labs: Recent Labs  Lab 01/27/22 1551 01/27/22 1754  LATICACIDVEN 1.3 1.3    Recent Results (from the past 240 hour(s))  Urine Culture     Status: Abnormal   Collection Time: 01/27/22  6:15 PM   Specimen: Urine, Clean Catch  Result Value Ref Range Status   Specimen Description    Final    URINE, CLEAN CATCH Performed at [facility], [address], [place], [place] [address]    Special Requests   Final    NONE Performed at [facility], [address], [place], [place] [address]    Culture MULTIPLE SPECIES PRESENT, SUGGEST RECOLLECTION (A)  Final   Report Status 01/29/2022 FINAL  Final  MRSA Next Gen by PCR, Nasal     Status: None   Collection Time: 01/29/22  5:40 AM   Specimen: Nasal Mucosa; Nasal Swab  Result Value Ref Range Status   MRSA by PCR Next Gen NOT DETECTED NOT DETECTED Final    Comment: (NOTE) The GeneXpert MRSA Assay (FDA approved for NASAL specimens only), is one component of a comprehensive MRSA colonization surveillance program. It is not intended to diagnose MRSA infection nor to guide or monitor treatment for MRSA infections. Test performance is not FDA approved in patients less than 1 years old. Performed at [facility], [address] [place], [place] [address]          Radiology Studies: ECHOCARDIOGRAM LIMITED  Result Date: 01/28/2022    ECHOCARDIOGRAM LIMITED REPORT   Patient Name:   ANNISA MAZZARELLA Date of Exam: 01/28/2022 Medical Rec #:  [id number]       Height:       61.0 in Accession #:    [id number]      Weight:       307.8 lb Date of Birth:  1956/01/05      BSA:          2.268 m Patient Age:    65 years        BP:           121/56 mmHg Patient Gender: F               HR:           79 bpm. [facility] Procedure: Limited Echo and Intracardiac Opacification Agent Indications:    CHF  History:        Patient has prior history of Echocardiogram examinations, most                 recent 04/06/2021. CHF, CAD, COPD, Signs/Symptoms:Chest Pain and                 Dyspnea; Risk Factors:Hypertension, Diabetes and Current Smoker.  Sonographer:    Mikki Harbor  Referring Phys: [id number] Leanne Chang [facility]  Sonographer Comments: Technically difficult study due to poor echo windows and patient is obese. IMPRESSIONS  1. Left  ventricular ejection fraction, by estimation, is 55 to 60%. The left ventricle has normal function. The left ventricle has no regional wall motion abnormalities.  2. Right ventricular systolic function was not well visualized. The right ventricular size is not well visualized.  3. The inferior vena cava is normal in size with greater than 50% respiratory variability, suggesting right atrial pressure of 3 mmHg.  4. Limited echo to evaluate LV function FINDINGS  Left Ventricle: Left ventricular ejection fraction, by estimation, is 55 to 60%. The left ventricle has normal function. The left ventricle has no regional wall motion abnormalities. Definity contrast agent was given IV to delineate the left ventricular  endocardial borders. Right Ventricle: The right ventricular size is not well visualized. Right vetricular wall thickness was not well visualized. Right ventricular systolic function was not well visualized. Pericardium: There is no evidence of pericardial effusion. Venous: The inferior vena cava is normal in size with greater than 50% respiratory variability, suggesting right atrial pressure of 3 mmHg. LEFT VENTRICLE PLAX 2D LVIDd:         4.50 cm LVIDs:         3.20 cm LV PW:         1.30 cm LV IVS:        1.30 cm LVOT diam:     2.10 cm LVOT Area:     3.46 cm  LEFT ATRIUM         Index LA diam:    4.10 cm 1.81 cm/m   AORTA Ao Root diam: 3.60 cm  SHUNTS Systemic Diam: 2.10 cm Carlyle Dolly MD Electronically signed by Carlyle Dolly MD Signature Date/Time: 01/28/2022/2:27:23 PM    Final    CT Angio Chest PE W and/or Wo Contrast  Result Date: 01/27/2022 CLINICAL DATA:  Pulmonary embolism (PE) suspected, high prob. Dyspnea, leg swelling EXAM: CT ANGIOGRAPHY CHEST WITH CONTRAST TECHNIQUE: Multidetector CT imaging of the chest was performed using the standard protocol during bolus administration of intravenous contrast. Multiplanar CT image reconstructions and MIPs were obtained to evaluate the vascular  anatomy. RADIATION DOSE REDUCTION: This exam was performed according to the departmental dose-optimization program which includes automated exposure control, adjustment of the mA and/or kV according to patient size and/or use of iterative reconstruction technique. CONTRAST:  149mL OMNIPAQUE IOHEXOL 350 MG/ML SOLN COMPARISON:  None Available. FINDINGS: Cardiovascular: There is relatively poor opacification of pulmonary arterial tree due to bolus timing and the examination is adequate only for exclusion of large pulmonary emboli within the main and central right left pulmonary arteries. Lobar and peripheral pulmonary arteries are not adequately opacified to definitively exclude the presence of small pulmonary emboli. The central pulmonary arteries are of normal caliber. Extensive multi-vessel coronary artery calcification. Global cardiac size is mildly enlarged. No pericardial effusion. Mild atherosclerotic calcification within the thoracic aorta. No aortic aneurysm. Mediastinum/Nodes: No enlarged mediastinal, hilar, or axillary lymph nodes. Thyroid gland, trachea, and esophagus demonstrate no significant findings. Lungs/Pleura: 6 mm noncalcified pulmonary nodule within the right lower lobe, axial image # 74/7 and 5 mm noncalcified pulmonary nodule, left upper lobe, axial image # 47/7 are no focal pulmonary infiltrates. No pneumothorax or pleural effusion. Central airways are widely patent. Stable since prior examination and safely considered benign. Multiple pulmonary nodules within the basilar left lower lobe measuring up to 7 mm are new from prior  examination and are technically indeterminate. Upper Abdomen: No acute abnormality. Musculoskeletal: Multiple healed right rib fractures are seen. Advanced degenerative changes are seen within the shoulders bilaterally. Degenerative changes are seen within the thoracic spine. No acute bone abnormality. No lytic or blastic bone lesion. Review of the MIP images confirms the  above findings. IMPRESSION: 1. Technically limited examination without evidence of large pulmonary emboli within the main and central right and left pulmonary arteries. Lobar and peripheral pulmonary arteries are not adequately opacified to definitively exclude the presence of small pulmonary emboli. 2. Extensive multi-vessel coronary artery calcification. Mild global cardiomegaly. 3. Multiple pulmonary nodules within the basilar left lower lobe measuring up to 7 mm, new from prior examination and technically indeterminate. Non-contrast chest CT at 3-6 months is recommended. If the nodules are stable at time of repeat CT, then future CT at 18-24 months (from today's scan) is considered optional for low-risk patients, but is recommended for high-risk patients. This recommendation follows the consensus statement: Guidelines for Management of Incidental Pulmonary Nodules Detected on CT Images: From the Fleischner Society 2017; Radiology 2017; 284:228-243. Aortic Atherosclerosis (ICD10-I70.0). Electronically Signed   By: Helyn Numbers M.D.   On: 01/27/2022 19:43   US Venous Img Lower Bilateral  Result Date: 01/27/2022 CLINICAL DATA:  Shortness of breath with cough, bilateral lower extremity swelling. EXAM: BILATERAL LOWER EXTREMITY VENOUS DOPPLER ULTRASOUND TECHNIQUE: Gray-scale sonography with graded compression, as well as color Doppler and duplex ultrasound were performed to evaluate the lower extremity deep venous systems from the level of the common femoral vein and including the common femoral, femoral, profunda femoral, popliteal and calf veins including the posterior tibial, peroneal and gastrocnemius veins when visible. The superficial great saphenous vein was also interrogated. Spectral Doppler was utilized to evaluate flow at rest and with distal augmentation maneuvers in the common femoral, femoral and popliteal veins. COMPARISON:  None Available. FINDINGS: RIGHT LOWER EXTREMITY Common Femoral Vein: No  evidence of thrombus. Normal compressibility, respiratory phasicity and response to augmentation. Saphenofemoral Junction: No evidence of thrombus. Normal compressibility and flow on color Doppler imaging. Profunda Femoral Vein: No evidence of thrombus. Normal compressibility and flow on color Doppler imaging. Femoral Vein: No evidence of thrombus. Normal compressibility, respiratory phasicity and response to augmentation. Popliteal Vein: No evidence of thrombus. Normal compressibility, respiratory phasicity and response to augmentation. Calf Veins: No evidence of thrombus. Normal compressibility and flow on color Doppler imaging. Superficial Great Saphenous Vein: No evidence of thrombus. Normal compressibility. Venous Reflux:  None. Other Findings:  None. LEFT LOWER EXTREMITY Common Femoral Vein: No evidence of thrombus. Normal compressibility, respiratory phasicity and response to augmentation. Saphenofemoral Junction: No evidence of thrombus. Normal compressibility and flow on color Doppler imaging. Profunda Femoral Vein: No evidence of thrombus. Normal compressibility and flow on color Doppler imaging. Femoral Vein: No evidence of thrombus. Normal compressibility, respiratory phasicity and response to augmentation. Popliteal Vein: No evidence of thrombus. Normal compressibility, respiratory phasicity and response to augmentation. Calf Veins: No evidence of thrombus. Normal compressibility and flow on color Doppler imaging. Superficial Great Saphenous Vein: No evidence of thrombus. Normal compressibility. Venous Reflux:  None. Other Findings:  None. IMPRESSION: No evidence of deep venous thrombosis in either lower extremity. Electronically Signed   By: Romona Curls M.D.   On: 01/27/2022 17:39   DG Chest Portable 1 View  Result Date: 01/27/2022 CLINICAL DATA:  Productive cough.  Shortness of breath. EXAM: PORTABLE CHEST 1 VIEW COMPARISON:  One-view chest x-ray 07/30/2020 FINDINGS: Heart is enlarged,  exaggerated by low lung volumes. Mild edema is present. No focal airspace consolidation is present. Advanced degenerative changes are noted in both shoulders. IMPRESSION: Cardiomegaly and mild edema compatible with congestive heart failure. Electronically Signed   By: San Morelle M.D.   On: 01/27/2022 15:52        Scheduled Meds:  aspirin EC  81 mg Oral Q breakfast   atorvastatin  80 mg Oral Daily   buPROPion  150 mg Oral Daily   carvedilol  6.25 mg Oral BID WC   DULoxetine  60 mg Oral BID   gabapentin  300 mg Oral TID   heparin injection (subcutaneous)  5,000 Units Subcutaneous Q8H   insulin aspart  0-5 Units Subcutaneous QHS   insulin aspart  0-9 Units Subcutaneous TID WC   melatonin  6 mg Oral QHS   mometasone-formoterol  2 puff Inhalation BID   ticagrelor  90 mg Oral BID   Continuous Infusions:  cefTRIAXone (ROCEPHIN)  IV 2 g (01/28/22 2355)     LOS: 2 days    Time spent: 35 minutes    Nayan Proch Darleen Crocker, DO [facility]  If 7PM-7AM, please contact night-coverage [email or web address] 01/29/2022, 10:56 AM

## 2022-01-29 NOTE — Plan of Care (Signed)
  Problem: Coping: Goal: Ability to adjust to condition or change in health will improve Outcome: Progressing   Problem: Fluid Volume: Goal: Ability to maintain a balanced intake and output will improve Outcome: Progressing   Problem: Nutritional: Goal: Maintenance of adequate nutrition will improve Outcome: Progressing Goal: Progress toward achieving an optimal weight will improve Outcome: Progressing   Problem: Skin Integrity: Goal: Risk for impaired skin integrity will decrease Outcome: Progressing   Problem: Tissue Perfusion: Goal: Adequacy of tissue perfusion will improve Outcome: Progressing   Problem: Education: Goal: Knowledge of General Education information will improve Description: Including pain rating scale, medication(s)/side effects and non-pharmacologic comfort measures Outcome: Progressing   Problem: Clinical Measurements: Goal: Cardiovascular complication will be avoided Outcome: Progressing   Problem: Activity: Goal: Risk for activity intolerance will decrease Outcome: Progressing   Problem: Nutrition: Goal: Adequate nutrition will be maintained Outcome: Progressing   Problem: Pain Managment: Goal: General experience of comfort will improve Outcome: Progressing   Problem: Safety: Goal: Ability to remain free from injury will improve Outcome: Progressing

## 2022-01-30 DIAGNOSIS — L03119 Cellulitis of unspecified part of limb: Secondary | ICD-10-CM | POA: Diagnosis not present

## 2022-01-30 LAB — BASIC METABOLIC PANEL
Anion gap: 7 (ref 5–15)
BUN: 25 mg/dL — ABNORMAL HIGH (ref 8–23)
CO2: 26 mmol/L (ref 22–32)
Calcium: 8.5 mg/dL — ABNORMAL LOW (ref 8.9–10.3)
Chloride: 104 mmol/L (ref 98–111)
Creatinine, Ser: 0.97 mg/dL (ref 0.44–1.00)
GFR, Estimated: >60 mL/min (ref >60)
Glucose, Bld: 82 mg/dL (ref 70–99)
Potassium: 3.7 mmol/L (ref 3.5–5.1)
Sodium: 137 mmol/L (ref 135–145)

## 2022-01-30 LAB — CBC
HCT: 37.2 % (ref 36.0–46.0)
Hemoglobin: 11.6 g/dL — ABNORMAL LOW (ref 12.0–15.0)
MCH: 28.8 pg (ref 26.0–34.0)
MCHC: 31.2 g/dL (ref 30.0–36.0)
MCV: 92.3 fL (ref 80.0–100.0)
Platelets: 209 10*3/uL (ref 150–400)
RBC: 4.03 MIL/uL (ref 3.87–5.11)
RDW: 14.3 % (ref 11.5–15.5)
WBC: 6.1 10*3/uL (ref 4.0–10.5)
nRBC: 0 % (ref 0.0–0.2)

## 2022-01-30 LAB — MAGNESIUM: Magnesium: 1.9 mg/dL (ref 1.7–2.4)

## 2022-01-30 LAB — GLUCOSE, CAPILLARY
Glucose-Capillary: 78 mg/dL (ref 70–99)
Glucose-Capillary: 86 mg/dL (ref 70–99)
Glucose-Capillary: 110 mg/dL — ABNORMAL HIGH (ref 70–99)
Glucose-Capillary: 96 mg/dL (ref 70–99)

## 2022-01-30 MED ORDER — SODIUM CHLORIDE 0.9 % IV BOLUS
500.0000 mL | Freq: Once | INTRAVENOUS | Status: AC
  Administered 2022-01-30: 500 mL via INTRAVENOUS

## 2022-01-30 NOTE — Progress Notes (Signed)
PROGRESS NOTE    Marie West  OFH:[id number] DOB: August 25, 1955 DOA: 01/27/2022 PCP: Waldon Reining, MD   Brief Narrative:  Marie West is a 66 y.o. female with medical history significant for diastolic CHF, DM, obesity, hypertension, COPD. Patient presented to the ED with complaints of bilateral lower extremity swelling over the past month, and over the past 2 to 3 weeks, she developed a wound to her right lower extremity yesterday draining.  Patient was admitted with acute diastolic CHF exacerbation in the setting of volume overload with bilateral lower extremity cellulitis and UTI.   Assessment & Plan:   Principal Problem:   Cellulitis Active Problems:   Acute diastolic CHF (congestive heart failure) (HCC)   COPD (chronic obstructive pulmonary disease) (HCC)   CAD (coronary artery disease)   UTI (urinary tract infection)   Morbid obesity (HCC)   Hypertension   Non-insulin dependent type 2 diabetes mellitus (HCC)   Depression   Hypothyroidism  Assessment and Plan:   Cellulitis-improving Bilateral lower extremity cellulitis, open and draining purulent wound to right lower extremity.  Rules out for sepsis at this time.  WBC 7.  Bilateral venous Dopplers negative for DVT. -Continue Rocephin only and vancomycin discontinued as MRSA negative, is diabetic, and for possible UTI   Acute diastolic CHF (congestive heart failure) (HCC) Bilateral lower extremity swelling, BNP 32, improved compared to prior.  Initial chest x-ray suggesting mild edema, subsequent CTA chest-Limited study no PE, mild global cardiomegaly extensive multivessel coronary artery calcification.  - Last Echo 03/2021, EF of 40 to 45% with grade 1 diastolic dysfunction. -2D echocardiogram performed 8/11 with LVEF 55-60% -Hold further IV Lasix given creatinine bump and hypotension -Strict input output, daily weights, daily BMP -EKG with some T wave changes, check troponin x 2 -Trend creatinine, with some Vanc  exposure, diuretics and contrast   UTI (urinary tract infection) UA with small leukocytes rare bacteria, she reports urinary frequency, without dysuria.  Afebrile without leukocytosis.  Rules out for sepsis at this time. -IV ceftriaxone 2 g daily -Urine cultures with multiple species growth, completed 3-day course of treatment   CAD (coronary artery disease) Cardiac cath 03/2021, with placement of DES stent-for first marginal lesion 80% stenosis.  EKG today shows some T wave changes.  She denies chest pain -Trend troponin negative -Updated limited echo -Resume Brilinta, aspirin, statins, carvedilol -Hold lisinopril for now with contrast exposure     COPD (chronic obstructive pulmonary disease) (HCC) Faint expiratory wheezing.  Mild cough, reports orthopnea and dyspnea with exertion. -DuoNebs as needed and scheduled for now -Mucolytic's as needed -Hold off on steroids at this time, pending clinical course     Non-insulin dependent type 2 diabetes mellitus (HCC) Random glucose 84. - SSI- S - HgbA1c 6.0% -Hold Farxiga and metformin   Hypertension-currently hypotensive -Coreg with holding parameters and spironolactone DC'd -Hold lisinopril for now with contrast exposure   Morbid obesity (HCC) BMI 56.3. With weakness and PT recommending home health and equipment   Depression Resume Cymbalta   AKI Resolving Noted to have hypotension and required fluid bolus overnight Hold further Lasix and monitor     DVT prophylaxis: Lovenox Code Status: Full Family Communication: None at bedside Disposition Plan:  Status is: Inpatient Remains inpatient appropriate because: Need for IV medications.   Consultants:  None   Procedures:  None   Antimicrobials:  Anti-infectives (From admission, onward)    Start     Dose/Rate Route Frequency Ordered Stop   01/29/22 2230  cefTRIAXone (ROCEPHIN) 2 g in sodium chloride 0.9 % 100 mL IVPB  Status:  Discontinued        2 g 200 mL/hr  over 30 Minutes Intravenous Every 24 hours 01/29/22 1103 01/29/22 1104   01/29/22 2230  cefTRIAXone (ROCEPHIN) 2 g in sodium chloride 0.9 % 100 mL IVPB        2 g 200 mL/hr over 30 Minutes Intravenous Every 24 hours 01/29/22 1104     01/28/22 0000  cefTRIAXone (ROCEPHIN) 2 g in sodium chloride 0.9 % 100 mL IVPB  Status:  Discontinued        2 g 200 mL/hr over 30 Minutes Intravenous Every 24 hours 01/27/22 2357 01/28/22 0000   01/27/22 2230  cefTRIAXone (ROCEPHIN) 2 g in sodium chloride 0.9 % 100 mL IVPB  Status:  Discontinued        2 g 200 mL/hr over 30 Minutes Intravenous Every 24 hours 01/27/22 2226 01/29/22 1103   01/27/22 2030  vancomycin (VANCOREADY) IVPB 1250 mg/250 mL  Status:  Discontinued        1,250 mg 166.7 mL/hr over 90 Minutes Intravenous Every 12 hours 01/27/22 2017 01/27/22 2018   01/27/22 2030  vancomycin (VANCOREADY) IVPB 1250 mg/250 mL  Status:  Discontinued        1,250 mg 166.7 mL/hr over 90 Minutes Intravenous Every 12 hours 01/27/22 2018 01/27/22 2020   01/27/22 2030  vancomycin (VANCOREADY) IVPB 2000 mg/400 mL  Status:  Discontinued        2,000 mg 200 mL/hr over 120 Minutes Intravenous Every 24 hours 01/27/22 2020 01/29/22 0913       Subjective: Patient seen and evaluated today with noted hypotension overnight requiring fluid bolus.  She continues to have leg pain and difficulty ambulating.  Objective: Vitals:   01/30/22 0837 01/30/22 1044 01/30/22 1403 01/30/22 1433  BP: 123/85  (!) 86/74 123/66  Pulse: 61  67 64  Resp:   19   Temp:   98.4 F (36.9 C)   TempSrc:   Oral   SpO2:  90% 94%   Weight:      Height:        Intake/Output Summary (Last 24 hours) at 01/30/2022 1434 Last data filed at 01/30/2022 1300 Gross per 24 hour  Intake 800 ml  Output --  Net 800 ml   Filed Weights   01/28/22 0655 01/29/22 0714 01/30/22 0500  Weight: (!) 139.6 kg (!) 139.8 kg (!) 137.1 kg    Examination:  General exam: Appears calm and comfortable,  obese Respiratory system: Clear to auscultation. Respiratory effort normal. Cardiovascular system: S1 & S2 heard, RRR.  Gastrointestinal system: Abdomen is soft Central nervous system: Alert and awake Extremities: No edema, lower extremity erythema improving Skin: No significant lesions noted Psychiatry: Flat affect.    Data Reviewed: I have personally reviewed following labs and imaging studies  CBC: Recent Labs  Lab 01/27/22 1551 01/28/22 0422 01/29/22 0626 01/30/22 0558  WBC 7.0 6.9 6.9 6.1  NEUTROABS 5.1  --   --   --   HGB 13.0 12.3 12.6 11.6*  HCT 41.6 38.9 40.6 37.2  MCV 91.6 91.3 92.7 92.3  PLT 223 205 202 209   Basic Metabolic Panel: Recent Labs  Lab 01/27/22 1551 01/28/22 0422 01/29/22 0626 01/30/22 0558  NA 140 139 140 137  K 4.0 3.8 4.2 3.7  CL 104 104 103 104  CO2 28 26 28 26   GLUCOSE 84 107* 102* 82  BUN 21 21  27* 25*  CREATININE 0.93 0.92 1.31* 0.97  CALCIUM 9.1 9.1 8.7* 8.5*  MG  --   --  1.9 1.9   GFR: Estimated Creatinine Clearance: 75.2 mL/min (by C-G formula based on SCr of 0.97 mg/dL). Liver Function Tests: Recent Labs  Lab 01/27/22 1551  AST 14*  ALT 13  ALKPHOS 87  BILITOT 0.4  PROT 7.3  ALBUMIN 3.7   No results for input(s): "LIPASE", "AMYLASE" in the last 168 hours. No results for input(s): "AMMONIA" in the last 168 hours. Coagulation Profile: No results for input(s): "INR", "PROTIME" in the last 168 hours. Cardiac Enzymes: No results for input(s): "CKTOTAL", "CKMB", "CKMBINDEX", "TROPONINI" in the last 168 hours. BNP (last 3 results) No results for input(s): "PROBNP" in the last 8760 hours. HbA1C: Recent Labs    01/27/22 2211  HGBA1C 6.0*   CBG: Recent Labs  Lab 01/29/22 1112 01/29/22 1614 01/29/22 2205 01/30/22 0717 01/30/22 1138  GLUCAP 140* 126* 81 78 86   Lipid Profile: No results for input(s): "CHOL", "HDL", "LDLCALC", "TRIG", "CHOLHDL", "LDLDIRECT" in the last 72 hours. Thyroid Function Tests: No  results for input(s): "TSH", "T4TOTAL", "FREET4", "T3FREE", "THYROIDAB" in the last 72 hours. Anemia Panel: No results for input(s): "VITAMINB12", "FOLATE", "FERRITIN", "TIBC", "IRON", "RETICCTPCT" in the last 72 hours. Sepsis Labs: Recent Labs  Lab 01/27/22 1551 01/27/22 1754  LATICACIDVEN 1.3 1.3    Recent Results (from the past 240 hour(s))  Urine Culture     Status: Abnormal   Collection Time: 01/27/22  6:15 PM   Specimen: Urine, Clean Catch  Result Value Ref Range Status   Specimen Description   Final    URINE, CLEAN CATCH Performed at [facility], [address], [place], [place] [address]    Special Requests   Final    NONE Performed at [facility], [address], [place], [place] [address]    Culture MULTIPLE SPECIES PRESENT, SUGGEST RECOLLECTION (A)  Final   Report Status 01/29/2022 FINAL  Final  MRSA Next Gen by PCR, Nasal     Status: None   Collection Time: 01/29/22  5:40 AM   Specimen: Nasal Mucosa; Nasal Swab  Result Value Ref Range Status   MRSA by PCR Next Gen NOT DETECTED NOT DETECTED Final    Comment: (NOTE) The GeneXpert MRSA Assay (FDA approved for NASAL specimens only), is one component of a comprehensive MRSA colonization surveillance program. It is not intended to diagnose MRSA infection nor to guide or monitor treatment for MRSA infections. Test performance is not FDA approved in patients less than 36 years old. Performed at [facility], [address], [place], [place] [address]          Radiology Studies: No results found.      Scheduled Meds:  aspirin EC  81 mg Oral Q breakfast   atorvastatin  80 mg Oral Daily   buPROPion  150 mg Oral Daily   carvedilol  6.25 mg Oral BID WC   DULoxetine  60 mg Oral BID   gabapentin  300 mg Oral TID   heparin injection (subcutaneous)  5,000 Units Subcutaneous Q8H   insulin aspart  0-5 Units Subcutaneous QHS   insulin aspart  0-9 Units Subcutaneous TID WC   melatonin  6 mg Oral QHS    mometasone-formoterol  2 puff Inhalation BID   ticagrelor  90 mg Oral BID   Continuous Infusions:  cefTRIAXone (ROCEPHIN)  IV 2 g (01/29/22 2216)     LOS: 3 days  Time spent: 35 minutes    Shai Rasmussen Darleen Crocker, DO [facility]  If 7PM-7AM, please contact night-coverage [email or web address] 01/30/2022, 2:34 PM

## 2022-01-30 NOTE — Plan of Care (Signed)
  Problem: Acute Rehab PT Goals(only PT should resolve) Goal: Pt Will Go Supine/Side To Sit Flowsheets (Taken 01/30/2022 1242) Pt will go Supine/Side to Sit: with modified independence Goal: Patient Will Transfer Sit To/From Stand Flowsheets (Taken 01/30/2022 1242) Patient will transfer sit to/from stand: with modified independence Goal: Pt Will Transfer Bed To Chair/Chair To Bed Flowsheets (Taken 01/30/2022 1242) Pt will Transfer Bed to Chair/Chair to Bed: with modified independence Goal: Pt Will Ambulate Flowsheets (Taken 01/30/2022 1242) Pt will Ambulate:  50 feet  with least restrictive assistive device  with supervision Goal: Pt Will Go Up/Down Stairs Flowsheets (Taken 01/30/2022 1242) Pt will Go Up / Down Stairs:  6-9 stairs  with modified independence  with rail(s)  with supervision    12:43 PM, 01/30/22 Georges Lynch PT DPT  Physical Therapist with [facility]  [phone]

## 2022-01-30 NOTE — Plan of Care (Addendum)
       CROSS COVER NOTE  NAME: Marie West MRN: [id number] DOB : [birth year]    Date of Service   01/30/22  HPI/Events of Note    79/51, HR 63. here for cellulitis. pt has CHF, no IVF.  Interventions   Assessment: Suspect related to overdiuresis in management of acute CHF Plan: NS bolus @250ml /hr. Stop bolus if worsening shortness of breath or symptoms of volume overload

## 2022-01-30 NOTE — Evaluation (Signed)
Physical Therapy Evaluation Patient Details Name: Marie West MRN: [id number] DOB: 1956-02-01 Today's Date: 01/30/2022  History of Present Illness  Marie West is a 66 y.o. female with medical history significant for diastolic CHF, DM, obesity, hypertension, COPD.  Patient presented to the ED with complaints of bilateral lower extremity swelling over the past month, and over the past 2 to 3 weeks, she developed a wound to her right lower extremity yesterday draining.  She reports increasing pain and swelling since then, with worsening redness of her lower extremities.  No fevers no chills.  She reports her bilateral lower extremity is also swollen she is unable to lift them, also reports abdominal bloating.  Patient also reports progressive difficulty breathing worse when she lies down and with exertion.  No chest pain.  She reports a very mild cough.  Clinical Impression  Patient presents seated in recliner, with Daughter and 2 granddaughters present in room. Patient reports low back pain with sciatica present, which is currently impacting function. Patient requiring assist for transfers and bed mobility due to weakness and back pain. Notably weaker throughout RLE. Patient able to walk short distance with min guard, but limited by back pain and fatigue. Also, does note slight dizziness, with use of 2L O2. Patient returned to chair with family present. Call bell and phone within reach. Patient will benefit from continued physical therapy in hospital and recommended venue below to increase strength, balance, endurance for safe ADLs and gait.         Recommendations for follow up therapy are one component of a multi-disciplinary discharge planning process, led by the attending physician.  Recommendations may be updated based on patient status, additional functional criteria and insurance authorization.  Follow Up Recommendations Home health PT      Assistance Recommended at Discharge  Intermittent Supervision/Assistance  Patient can return home with the following  A little help with walking and/or transfers;A little help with bathing/dressing/bathroom;Help with stairs or ramp for entrance    Equipment Recommendations Rollator (4 wheels);BSC/3in1 (May need bariatric equipment)  Recommendations for Other Services       Functional Status Assessment       Precautions / Restrictions Precautions Precautions: Fall Restrictions Weight Bearing Restrictions: No      Mobility  Bed Mobility Overal bed mobility: Needs Assistance Bed Mobility: Supine to Sit     Supine to sit: Mod assist     General bed mobility comments: slow, labored, limited by back pain    Transfers Overall transfer level: Needs assistance Equipment used: Rolling walker (2 wheels) Transfers: Sit to/from Stand Sit to Stand: Min assist           General transfer comment: slow, labored    Ambulation/Gait Ambulation/Gait assistance: Min guard Gait Distance (Feet): 8 Feet Assistive device: Rolling walker (2 wheels) Gait Pattern/deviations: Decreased stride length, Trunk flexed Gait velocity: Decreased     General Gait Details: slow, limited by back and RLE pain  Stairs            Wheelchair Mobility    Modified Rankin (Stroke Patients Only)       Balance Overall balance assessment: Needs assistance Sitting-balance support: Feet supported Sitting balance-Leahy Scale: Fair Sitting balance - Comments: seated at edge of chair   Standing balance support: Bilateral upper extremity supported Standing balance-Leahy Scale: Poor Standing balance comment: Requires RW with UE support  Pertinent Vitals/Pain Pain Assessment Pain Assessment: 0-10 Pain Score: 7  Pain Location: low back Pain Descriptors / Indicators: Aching Pain Intervention(s): Limited activity within patient's tolerance, Monitored during session    Home Living  Family/patient expects to be discharged to:: Private residence Living Arrangements: Children Available Help at Discharge: Family;Available 24 hours/day Type of Home: House Home Access: Stairs to enter Entrance Stairs-Rails: Can reach both Entrance Stairs-Number of Steps: 7   Home Layout: Able to live on main level with bedroom/bathroom Home Equipment: Rollator (4 wheels) Additional Comments: Living with daughter currently, who works form home and is available 24/7    Prior Function Prior Level of Function : Needs assist       Physical Assist : Mobility (physical);ADLs (physical) Mobility (physical): Bed mobility;Transfers;Stairs           Hand Dominance        Extremity/Trunk Assessment   Upper Extremity Assessment Upper Extremity Assessment: Defer to OT evaluation    Lower Extremity Assessment Lower Extremity Assessment: Generalized weakness    Cervical / Trunk Assessment Cervical / Trunk Assessment: Normal  Communication   Communication: No difficulties  Cognition Arousal/Alertness: Awake/alert Behavior During Therapy: WFL for tasks assessed/performed Overall Cognitive Status: Within Functional Limits for tasks assessed                                          General Comments      Exercises     Assessment/Plan    PT Assessment Patient needs continued PT services  PT Problem List Decreased strength;Decreased mobility;Decreased activity tolerance;Pain       PT Treatment Interventions Gait training;Stair training;Therapeutic activities;Patient/family education;Functional mobility training;Balance training;DME instruction;Therapeutic exercise    PT Goals (Current goals can be found in the Care Plan section)  Acute Rehab PT Goals Patient Stated Goal: Return home PT Goal Formulation: With patient/family Time For Goal Achievement: 02/13/22 Potential to Achieve Goals: Good    Frequency Min 2X/week     Co-evaluation                AM-PAC PT "6 Clicks" Mobility  Outcome Measure Help needed turning from your back to your side while in a flat bed without using bedrails?: A Little Help needed moving from lying on your back to sitting on the side of a flat bed without using bedrails?: A Lot Help needed moving to and from a bed to a chair (including a wheelchair)?: A Little Help needed standing up from a chair using your arms (e.g., wheelchair or bedside chair)?: A Little Help needed to walk in hospital room?: A Little Help needed climbing 3-5 steps with a railing? : A Lot 6 Click Score: 16    End of Session Equipment Utilized During Treatment: Oxygen Activity Tolerance: Patient limited by fatigue;Patient limited by pain Patient left: in chair;with family/visitor present;with call bell/phone within reach Nurse Communication: Mobility status PT Visit Diagnosis: Unsteadiness on feet (R26.81);Other abnormalities of gait and mobility (R26.89);Difficulty in walking, not elsewhere classified (R26.2);Muscle weakness (generalized) (M62.81)    Time: 1700-1749 PT Time Calculation (min) (ACUTE ONLY): 23 min   Charges:   PT Evaluation $PT Eval Low Complexity: 1 Low         12:41 PM, 01/30/22 Georges Lynch PT DPT  Physical Therapist with [facility]  [phone]

## 2022-01-30 NOTE — Progress Notes (Signed)
Patient up to chair at this time, patient complaint of pain gave prn pain medicine refer to mar for dose, will reassess pain. call bell in reach. Will continue to monitor throughout shift.

## 2022-01-31 DIAGNOSIS — L03119 Cellulitis of unspecified part of limb: Secondary | ICD-10-CM | POA: Diagnosis not present

## 2022-01-31 LAB — GLUCOSE, CAPILLARY
Glucose-Capillary: 107 mg/dL — ABNORMAL HIGH (ref 70–99)
Glucose-Capillary: 99 mg/dL (ref 70–99)

## 2022-01-31 MED ORDER — CEPHALEXIN 500 MG PO CAPS: 500.0000 mg | ORAL_CAPSULE | Freq: Four times a day (QID) | ORAL | 0 refills | Status: AC

## 2022-01-31 MED ORDER — IPRATROPIUM-ALBUTEROL 0.5-2.5 (3) MG/3ML IN SOLN
3.0000 mL | Freq: Once | RESPIRATORY_TRACT | Status: AC
  Administered 2022-01-31: 3 mL via RESPIRATORY_TRACT

## 2022-01-31 MED ORDER — FUROSEMIDE 10 MG/ML IJ SOLN: 40.0000 mg | Freq: Two times a day (BID) | INTRAMUSCULAR | Status: DC

## 2022-01-31 NOTE — Progress Notes (Signed)
Patient being discharged home with home oxygen. IV removed, discharge papers given and reviewed with patient.

## 2022-01-31 NOTE — Discharge Summary (Signed)
Physician Discharge Summary  Marie West ZOX:[id number] DOB: [birth year] DOA: 01/27/2022  PCP: Waldon Reining, MD  Admit date: 01/27/2022  Discharge date: 01/31/2022  Admitted From:Home  Disposition:  Home  Recommendations for Outpatient Follow-up:  Follow up with PCP in 1-2 weeks Continue Keflex for 6 more days to complete total 10-day course for bilateral lower extremity cellulitis Continue other home medications as noted below  Home Health: Yes with PT  Equipment/Devices: Rolling walker, 3 and 1  Discharge Condition:Stable  CODE STATUS: Full  Diet recommendation: Heart Healthy/carb modified  Brief/Interim Summary: Marie West is a 66 y.o. female with medical history significant for diastolic CHF, DM, obesity, hypertension, COPD. Patient presented to the ED with complaints of bilateral lower extremity swelling over the past month, and over the past 2 to 3 weeks, she developed a wound to her right lower extremity yesterday draining.  Patient was admitted with acute diastolic CHF exacerbation in the setting of volume overload with bilateral lower extremity cellulitis and UTI.  Patient was primarily noted to have bilateral lower extremity cellulitis and appears that her lower extremity edema was mostly associated with this but she did have some mild acute diastolic CHF exacerbation as well.  Echo with LVEF 55-60% as noted below.  UTI was also noted and she completed 3 days course of treatment for this, but will have further treatment for her cellulitis.  Urine cultures with multiple species growth noted.  She continued to have some pain to her low back and legs and was seen by physical therapy with recommendations for home health PT as well as equipment as noted above.  She will need to remain on Keflex for 6 more days to complete total 10-day course of treatment as noted above.  Discharge Diagnoses:  Principal Problem:   Cellulitis Active Problems:   Acute diastolic CHF  (congestive heart failure) (HCC)   COPD (chronic obstructive pulmonary disease) (HCC)   CAD (coronary artery disease)   UTI (urinary tract infection)   Morbid obesity (HCC)   Hypertension   Non-insulin dependent type 2 diabetes mellitus (HCC)   Depression   Hypothyroidism  Principal discharge diagnosis: Bilateral lower extremity cellulitis, UTI, and acute on chronic diastolic CHF exacerbation.  Discharge Instructions  Discharge Instructions     Diet - low sodium heart healthy   Complete by: As directed    If the dressing is still on your incision site when you go home, remove it on the third day after your surgery date. Remove dressing if it begins to fall off, or if it is dirty or damaged before the third day.   Complete by: As directed    Increase activity slowly   Complete by: As directed       Allergies as of 01/31/2022       Reactions   Bee Venom Swelling        Medication List     STOP taking these medications    lisinopril-hydrochlorothiazide 20-12.5 MG tablet Commonly known as: ZESTORETIC       TAKE these medications    acetaminophen 325 MG tablet Commonly known as: TYLENOL Take 2 tablets (650 mg total) by mouth every 6 (six) hours as needed for mild pain, fever or headache (or Fever >/= 101).   albuterol 108 (90 Base) MCG/ACT inhaler Commonly known as: VENTOLIN HFA Inhale 2 puffs into the lungs every 6 (six) hours as needed for wheezing or shortness of breath.   amitriptyline 25 MG tablet Commonly known as:  ELAVIL Take 25 mg by mouth at bedtime as needed for sleep.   aspirin EC 81 MG tablet Take 1 tablet (81 mg total) by mouth daily with breakfast.   atorvastatin 80 MG tablet Commonly known as: LIPITOR Take 1 tablet (80 mg total) by mouth daily.   budesonide-formoterol 160-4.5 MCG/ACT inhaler Commonly known as: SYMBICORT Inhale 2 puffs into the lungs 2 (two) times daily.   buPROPion 150 MG 12 hr tablet Commonly known as: ZYBAN Take 150 mg  by mouth daily.   carvedilol 6.25 MG tablet Commonly known as: COREG Take 1 tablet (6.25 mg total) by mouth 2 (two) times daily with a meal.   cephALEXin 500 MG capsule Commonly known as: KEFLEX Take 1 capsule (500 mg total) by mouth 4 (four) times daily for 6 days.   cholecalciferol 25 MCG (1000 UNIT) tablet Commonly known as: VITAMIN D3 Take 1,000 Units by mouth daily.   cyanocobalamin 1000 MCG tablet Commonly known as: VITAMIN B12 Take 1,000 mcg by mouth daily.   dapagliflozin propanediol 10 MG Tabs tablet Commonly known as: FARXIGA Take 1 tablet (10 mg total) by mouth daily.   DULoxetine 60 MG capsule Commonly known as: CYMBALTA Take 60 mg by mouth 2 (two) times daily.   Euthyrox 50 MCG tablet Generic drug: levothyroxine Take 50 mcg by mouth daily before breakfast.   gabapentin 300 MG capsule Commonly known as: NEURONTIN Take 300 mg by mouth 3 (three) times daily.   ibuprofen 200 MG tablet Commonly known as: ADVIL Take 800 mg by mouth every 6 (six) hours as needed for moderate pain.   lisinopril 20 MG tablet Commonly known as: ZESTRIL Take 20 mg by mouth daily.   loratadine 10 MG tablet Commonly known as: CLARITIN Take 10 mg by mouth daily.   Melatonin 10 MG Caps Take 10 mg by mouth at bedtime.   metFORMIN 500 MG tablet Commonly known as: GLUCOPHAGE Take 1 tablet (500 mg total) by mouth daily with breakfast.   oxyCODONE-acetaminophen 10-325 MG tablet Commonly known as: PERCOCET Take 1 tablet by mouth 4 (four) times daily as needed.   spironolactone 25 MG tablet Commonly known as: ALDACTONE Take 1/2 tablet (12.5 mg total) by mouth daily.   SSD 1 % cream Generic drug: silver sulfADIAZINE Apply 1 Application topically 3 (three) times daily as needed (Foot).   ticagrelor 90 MG Tabs tablet Commonly known as: BRILINTA Take 1 tablet (90 mg total) by mouth 2 (two) times daily.               Durable Medical Equipment  (From admission, onward)            Start     Ordered   01/31/22 0717  For home use only DME Walker rolling  Once       Question Answer Comment  Walker: With 5 Inch Wheels   Patient needs a walker to treat with the following condition Weakness      01/31/22 0716   01/31/22 0717  For home use only DME 3 n 1  Once        01/31/22 0716              Discharge Care Instructions  (From admission, onward)           Start     Ordered   01/31/22 0000  If the dressing is still on your incision site when you go home, remove it on the third day after your surgery date. Remove  dressing if it begins to fall off, or if it is dirty or damaged before the third day.        01/31/22 0938            Follow-up Information     Waldon Reining, MD. Schedule an appointment as soon as possible for a visit in 1 week(s).   Specialties: Family Medicine, Sports Medicine Contact information: [address] [place] [address] [phone]                Allergies  Allergen Reactions   Bee Venom Swelling    Consultations: None   Procedures/Studies: ECHOCARDIOGRAM LIMITED  Result Date: 01/28/2022    ECHOCARDIOGRAM LIMITED REPORT   Patient Name:   Marie West Date of Exam: 01/28/2022 Medical Rec #:  [id number]       Height:       61.0 in Accession #:    [id number]      Weight:       307.8 lb Date of Birth:  [birth year]      BSA:          2.268 m Patient Age:    65 years        BP:           121/56 mmHg Patient Gender: F               HR:           79 bpm. [facility] Procedure: Limited Echo and Intracardiac Opacification Agent Indications:    CHF  History:        Patient has prior history of Echocardiogram examinations, most                 recent 04/06/2021. CHF, CAD, COPD, Signs/Symptoms:Chest Pain and                 Dyspnea; Risk Factors:Hypertension, Diabetes and Current Smoker.  Sonographer:    Mikki Harbor Referring Phys: [phone] Hel[facility]  Sonographer Comments:  Technically difficult study due to poor echo windows and patient is obese. IMPRESSIONS  1. Left ventricular ejection fraction, by estimation, is 55 to 60%. The left ventricle has normal function. The left ventricle has no regional wall motion abnormalities.  2. Right ventricular systolic function was not well visualized. The right ventricular size is not well visualized.  3. The inferior vena cava is normal in size with greater than 50% respiratory variability, suggesting right atrial pressure of 3 mmHg.  4. Limited echo to evaluate LV function FINDINGS  Left Ventricle: Left ventricular ejection fraction, by estimation, is 55 to 60%. The left ventricle has normal function. The left ventricle has no regional wall motion abnormalities. Definity contrast agent was given IV to delineate the left ventricular  endocardial borders. Right Ventricle: The right ventricular size is not well visualized. Right vetricular wall thickness was not well visualized. Right ventricular systolic function was not well visualized. Pericardium: There is no evidence of pericardial effusion. Venous: The inferior vena cava is normal in size with greater than 50% respiratory variability, suggesting right atrial pressure of 3 mmHg. LEFT VENTRICLE PLAX 2D LVIDd:         4.50 cm LVIDs:         3.20 cm LV PW:         1.30 cm LV IVS:        1.30 cm LVOT diam:     2.10 cm LVOT Area:  3.46 cm  LEFT ATRIUM         Index LA diam:    4.10 cm 1.81 cm/m   AORTA Ao Root diam: 3.60 cm  SHUNTS Systemic Diam: 2.10 cm Dina Rich MD Electronically signed by Dina Rich MD Signature Date/Time: 01/28/2022/2:27:23 PM    Final    CT Angio Chest PE W and/or Wo Contrast  Result Date: 01/27/2022 CLINICAL DATA:  Pulmonary embolism (PE) suspected, high prob. Dyspnea, leg swelling EXAM: CT ANGIOGRAPHY CHEST WITH CONTRAST TECHNIQUE: Multidetector CT imaging of the chest was performed using the standard protocol during bolus administration of intravenous  contrast. Multiplanar CT image reconstructions and MIPs were obtained to evaluate the vascular anatomy. RADIATION DOSE REDUCTION: This exam was performed according to the departmental dose-optimization program which includes automated exposure control, adjustment of the mA and/or kV according to patient size and/or use of iterative reconstruction technique. CONTRAST:  OMNIPAQUE IOHEXOL 350 MG/ML SOLN COMPARISON:  None Available. FINDINGS: Cardiovascular: There is relatively poor opacification of pulmonary arterial tree due to bolus timing and the examination is adequate only for exclusion of large pulmonary emboli within the main and central right left pulmonary arteries. Lobar and peripheral pulmonary arteries are not adequately opacified to definitively exclude the presence of small pulmonary emboli. The central pulmonary arteries are of normal caliber. Extensive multi-vessel coronary artery calcification. Global cardiac size is mildly enlarged. No pericardial effusion. Mild atherosclerotic calcification within the thoracic aorta. No aortic aneurysm. Mediastinum/Nodes: No enlarged mediastinal, hilar, or axillary lymph nodes. Thyroid gland, trachea, and esophagus demonstrate no significant findings. Lungs/Pleura: 6 mm noncalcified pulmonary nodule within the right lower lobe, axial image # 74/7 and 5 mm noncalcified pulmonary nodule, left upper lobe, axial image # 47/7 are no focal pulmonary infiltrates. No pneumothorax or pleural effusion. Central airways are widely patent. Stable since prior examination and safely considered benign. Multiple pulmonary nodules within the basilar left lower lobe measuring up to 7 mm are new from prior examination and are technically indeterminate. Upper Abdomen: No acute abnormality. Musculoskeletal: Multiple healed right rib fractures are seen. Advanced degenerative changes are seen within the shoulders bilaterally. Degenerative changes are seen within the thoracic spine. No  acute bone abnormality. No lytic or blastic bone lesion. Review of the MIP images confirms the above findings. IMPRESSION: 1. Technically limited examination without evidence of large pulmonary emboli within the main and central right and left pulmonary arteries. Lobar and peripheral pulmonary arteries are not adequately opacified to definitively exclude the presence of small pulmonary emboli. 2. Extensive multi-vessel coronary artery calcification. Mild global cardiomegaly. 3. Multiple pulmonary nodules within the basilar left lower lobe measuring up to 7 mm, new from prior examination and technically indeterminate. Non-contrast chest CT at 3-6 months is recommended. If the nodules are stable at time of repeat CT, then future CT at 18-24 months (from today's scan) is considered optional for low-risk patients, but is recommended for high-risk patients. This recommendation follows the consensus statement: Guidelines for Management of Incidental Pulmonary Nodules Detected on CT Images: From the Fleischner Society 2017; Radiology 2017; 284:228-243. Aortic Atherosclerosis (ICD10-I70.0). Electronically Signed   By: Helyn Numbers M.D.   On: 01/27/2022 19:43   US Venous Img Lower Bilateral  Result Date: 01/27/2022 CLINICAL DATA:  Shortness of breath with cough, bilateral lower extremity swelling. EXAM: BILATERAL LOWER EXTREMITY VENOUS DOPPLER ULTRASOUND TECHNIQUE: Gray-scale sonography with graded compression, as well as color Doppler and duplex ultrasound were performed to evaluate the lower extremity deep venous systems from  the level of the common femoral vein and including the common femoral, femoral, profunda femoral, popliteal and calf veins including the posterior tibial, peroneal and gastrocnemius veins when visible. The superficial great saphenous vein was also interrogated. Spectral Doppler was utilized to evaluate flow at rest and with distal augmentation maneuvers in the common femoral, femoral and  popliteal veins. COMPARISON:  None Available. FINDINGS: RIGHT LOWER EXTREMITY Common Femoral Vein: No evidence of thrombus. Normal compressibility, respiratory phasicity and response to augmentation. Saphenofemoral Junction: No evidence of thrombus. Normal compressibility and flow on color Doppler imaging. Profunda Femoral Vein: No evidence of thrombus. Normal compressibility and flow on color Doppler imaging. Femoral Vein: No evidence of thrombus. Normal compressibility, respiratory phasicity and response to augmentation. Popliteal Vein: No evidence of thrombus. Normal compressibility, respiratory phasicity and response to augmentation. Calf Veins: No evidence of thrombus. Normal compressibility and flow on color Doppler imaging. Superficial Great Saphenous Vein: No evidence of thrombus. Normal compressibility. Venous Reflux:  None. Other Findings:  None. LEFT LOWER EXTREMITY Common Femoral Vein: No evidence of thrombus. Normal compressibility, respiratory phasicity and response to augmentation. Saphenofemoral Junction: No evidence of thrombus. Normal compressibility and flow on color Doppler imaging. Profunda Femoral Vein: No evidence of thrombus. Normal compressibility and flow on color Doppler imaging. Femoral Vein: No evidence of thrombus. Normal compressibility, respiratory phasicity and response to augmentation. Popliteal Vein: No evidence of thrombus. Normal compressibility, respiratory phasicity and response to augmentation. Calf Veins: No evidence of thrombus. Normal compressibility and flow on color Doppler imaging. Superficial Great Saphenous Vein: No evidence of thrombus. Normal compressibility. Venous Reflux:  None. Other Findings:  None. IMPRESSION: No evidence of deep venous thrombosis in either lower extremity. Electronically Signed   By: Zerita Boers M.D.   On: 01/27/2022 17:39   DG Chest Portable 1 View  Result Date: 01/27/2022 CLINICAL DATA:  Productive cough.  Shortness of breath. EXAM:  PORTABLE CHEST 1 VIEW COMPARISON:  One-view chest x-ray 07/30/2020 FINDINGS: Heart is enlarged, exaggerated by low lung volumes. Mild edema is present. No focal airspace consolidation is present. Advanced degenerative changes are noted in both shoulders. IMPRESSION: Cardiomegaly and mild edema compatible with congestive heart failure. Electronically Signed   By: San Morelle M.D.   On: 01/27/2022 15:52     Discharge Exam: Vitals:   01/31/22 0708 01/31/22 0826  BP:  131/83  Pulse: 69 60  Resp: 16   Temp:    SpO2: 98%    Vitals:   01/30/22 2247 01/31/22 0531 01/31/22 0708 01/31/22 0826  BP: 118/71 130/68  131/83  Pulse: 61 65 69 60  Resp: 18 18 16    Temp: 98.2 F (36.8 C) 98 F (36.7 C)    TempSrc: Oral Oral    SpO2: 99% 97% 98%   Weight:  (!) 136.5 kg    Height:        General: Pt is alert, awake, not in acute distress, obese Cardiovascular: RRR, S1/S2 +, no rubs, no gallops Respiratory: CTA bilaterally, no wheezing, no rhonchi Abdominal: Soft, NT, ND, bowel sounds + Extremities: no edema, no cyanosis, minimal erythema bilaterally    The results of significant diagnostics from this hospitalization (including imaging, microbiology, ancillary and laboratory) are listed below for reference.     Microbiology: Recent Results (from the past 240 hour(s))  Urine Culture     Status: Abnormal   Collection Time: 01/27/22  6:15 PM   Specimen: Urine, Clean Catch  Result Value Ref Range Status   Specimen Description  Final    URINE, CLEAN CATCH Performed at [facility], [address] [place], [place] [address]    Special Requests   Final    NONE Performed at [facility], [address], [place], [place] [address]    Culture MULTIPLE SPECIES PRESENT, SUGGEST RECOLLECTION (A)  Final   Report Status 01/29/2022 FINAL  Final  MRSA Next Gen by PCR, Nasal     Status: None   Collection Time: 01/29/22  5:40 AM   Specimen: Nasal Mucosa; Nasal Swab  Result Value Ref Range  Status   MRSA by PCR Next Gen NOT DETECTED NOT DETECTED Final    Comment: (NOTE) The GeneXpert MRSA Assay (FDA approved for NASAL specimens only), is one component of a comprehensive MRSA colonization surveillance program. It is not intended to diagnose MRSA infection nor to guide or monitor treatment for MRSA infections. Test performance is not FDA approved in patients less than 21 years old. Performed at [facility], [address] [place], [place] [address]      Labs: BNP (last 3 results) Recent Labs    01/27/22 1551  BNP [id number]   Basic Metabolic Panel: Recent Labs  Lab 01/27/22 1551 01/28/22 0422 01/29/22 0626 01/30/22 0558  NA 140 139 140 137  K 4.0 3.8 4.2 3.7  CL 104 104 103 104  CO2 28 26 28 26   GLUCOSE 84 107* 102* 82  BUN 21 21 27* 25*  CREATININE 0.93 0.92 1.31* 0.97  CALCIUM 9.1 9.1 8.7* 8.5*  MG  --   --  1.9 1.9   Liver Function Tests: Recent Labs  Lab 01/27/22 1551  AST 14*  ALT 13  ALKPHOS 87  BILITOT 0.4  PROT 7.3  ALBUMIN 3.7   No results for input(s): "LIPASE", "AMYLASE" in the last 168 hours. No results for input(s): "AMMONIA" in the last 168 hours. CBC: Recent Labs  Lab 01/27/22 1551 01/28/22 0422 01/29/22 0626 01/30/22 0558  WBC 7.0 6.9 6.9 6.1  NEUTROABS 5.1  --   --   --   HGB 13.0 12.3 12.6 11.6*  HCT 41.6 38.9 40.6 37.2  MCV 91.6 91.3 92.7 92.3  PLT 223 205 202 209   Cardiac Enzymes: No results for input(s): "CKTOTAL", "CKMB", "CKMBINDEX", "TROPONINI" in the last 168 hours. BNP: Invalid input(s): "POCBNP" CBG: Recent Labs  Lab 01/30/22 0717 01/30/22 1138 01/30/22 1623 01/30/22 2249 01/31/22 0746  GLUCAP 78 86 110* 96 107*   D-Dimer No results for input(s): "DDIMER" in the last 72 hours. Hgb A1c No results for input(s): "HGBA1C" in the last 72 hours. Lipid Profile No results for input(s): "CHOL", "HDL", "LDLCALC", "TRIG", "CHOLHDL", "LDLDIRECT" in the last 72 hours. Thyroid function studies No results for  input(s): "TSH", "T4TOTAL", "T3FREE", "THYROIDAB" in the last 72 hours.  Invalid input(s): "FREET3" Anemia work up No results for input(s): "VITAMINB12", "FOLATE", "FERRITIN", "TIBC", "IRON", "RETICCTPCT" in the last 72 hours. Urinalysis    Component Value Date/Time   COLORURINE YELLOW 01/27/2022 1815   APPEARANCEUR HAZY (A) 01/27/2022 1815   LABSPEC 1.024 01/27/2022 1815   PHURINE 5.0 01/27/2022 1815   GLUCOSEU >=500 (A) 01/27/2022 1815   HGBUR NEGATIVE 01/27/2022 1815   BILIRUBINUR NEGATIVE 01/27/2022 1815   KETONESUR NEGATIVE 01/27/2022 1815   PROTEINUR 30 (A) 01/27/2022 1815   UROBILINOGEN 0.2 06/01/2012 1919   NITRITE NEGATIVE 01/27/2022 1815   LEUKOCYTESUR SMALL (A) 01/27/2022 1815   Sepsis Labs Recent Labs  Lab 01/27/22 1551 01/28/22 0422 01/29/22 [id number] 01/30/22 [id number]  WBC 7.0 6.9 6.9 6.1   Microbiology Recent Results (from the past 240 hour(s))  Urine Culture     Status: Abnormal   Collection Time: 01/27/22  6:15 PM   Specimen: Urine, Clean Catch  Result Value Ref Range Status   Specimen Description   Final    URINE, CLEAN CATCH Performed at [facility], [address], [place], [place] [address]    Special Requests   Final    NONE Performed at [facility], [address] [place], [place] [address]    Culture MULTIPLE SPECIES PRESENT, SUGGEST RECOLLECTION (A)  Final   Report Status 01/29/2022 FINAL  Final  MRSA Next Gen by PCR, Nasal     Status: None   Collection Time: 01/29/22  5:40 AM   Specimen: Nasal Mucosa; Nasal Swab  Result Value Ref Range Status   MRSA by PCR Next Gen NOT DETECTED NOT DETECTED Final    Comment: (NOTE) The GeneXpert MRSA Assay (FDA approved for NASAL specimens only), is one component of a comprehensive MRSA colonization surveillance program. It is not intended to diagnose MRSA infection nor to guide or monitor treatment for MRSA infections. Test performance is not FDA approved in patients less than 32 years old. Performed at  [facility], [address] [place], [place] [address]      Time coordinating discharge: 35 minutes  SIGNED:   Rodena Goldmann, DO [facility] 01/31/2022, 9:49 AM  If 7PM-7AM, please contact night-coverage [email or web address]

## 2022-01-31 NOTE — Progress Notes (Signed)
   01/31/22 1047  ReDS Vest / Clip  Station Marker B  Ruler Value 46  ReDS Value Range (!) > 40  ReDS Actual Value 32

## 2022-01-31 NOTE — Care Management Important Message (Signed)
Important Message  Patient Details  Name: Marie West MRN: [id number] Date of Birth: 1955-09-06   Medicare Important Message Given:  Yes     Corey Harold 01/31/2022, 10:22 AM

## 2022-01-31 NOTE — Progress Notes (Signed)
SATURATION QUALIFICATIONS: (This note is used to comply with regulatory documentation for home oxygen)  Patient Saturations on Room Air at Rest = 88%  Patient Saturations on Room Air while Ambulating =   Patient Saturations on 2 Liters of oxygen while Ambulating = 95%  Please briefly explain why patient needs home oxygen:

## 2022-01-31 NOTE — TOC Transition Note (Signed)
Transition of Care [facility]) - CM/SW Discharge Note   Patient Details  Name: Marie West MRN: [id number] Date of Birth: Sep 15, 1955  Transition of Care [facility]) CM/SW Contact:  Villa Herb, LCSWA Phone Number: 01/31/2022, 1:47 PM   Clinical Narrative:    CSW updated that pt will need home O2 set up prior to D/C. CSW spoke to [employer] who was working on 3N1 and walker. They will provide pts home O2 as well. CSW updated Kandee Keen with [facility] of pts interest in [facility] with their company. They are able to accept [facility] referral. Orders have been placed. TOC signing off.   Final next level of care: Home w Home Health Services Barriers to Discharge: Barriers Resolved   Patient Goals and CMS Choice Patient states their goals for this hospitalization and ongoing recovery are:: return home with [facility] CMS Medicare.gov Compare Post Acute Care list provided to:: Patient Choice offered to / list presented to : Patient  Discharge Placement                       Discharge Plan and Services In-house Referral: Clinical Social Work Discharge Planning Services: CM Consult Post Acute Care Choice: Home Health          DME Arranged: Dan Humphreys, Oxygen, 3-N-1 DME Agency: [employer]       HH Arranged: PT, RN HH Agency: [facility] Date [facility] Agency Contacted: 01/31/22   Representative spoke with at [facility] Agency: Kandee Keen  Social Determinants of Health (SDOH) Interventions     Readmission Risk Interventions     No data to display

## 2022-01-31 NOTE — Progress Notes (Signed)
   01/31/22 1047  ReDS Vest / Clip  Station Marker B  Ruler Value 46  ReDS Value Range < 36  ReDS Actual Value 32

## 2022-02-09 ENCOUNTER — Encounter (HOSPITAL_COMMUNITY): Payer: Self-pay | Admitting: Emergency Medicine

## 2022-02-09 ENCOUNTER — Inpatient Hospital Stay (HOSPITAL_COMMUNITY)
Admission: EM | Admit: 2022-02-09 | Discharge: 2022-02-12 | DRG: 641 | Disposition: A | Discharge status: Skilled Nursing Facility | Payer: Medicare HMO | Attending: Internal Medicine | Admitting: Internal Medicine

## 2022-02-09 ENCOUNTER — Other Ambulatory Visit: Payer: Self-pay

## 2022-02-09 DIAGNOSIS — M797 Fibromyalgia: Secondary | ICD-10-CM | POA: Diagnosis present

## 2022-02-09 DIAGNOSIS — I252 Old myocardial infarction: Secondary | ICD-10-CM | POA: Diagnosis not present

## 2022-02-09 DIAGNOSIS — I110 Hypertensive heart disease with heart failure: Secondary | ICD-10-CM | POA: Diagnosis present

## 2022-02-09 DIAGNOSIS — T675XXA Heat exhaustion, unspecified, initial encounter: Secondary | ICD-10-CM | POA: Diagnosis present

## 2022-02-09 DIAGNOSIS — Z6841 Body Mass Index (BMI) 40.0 and over, adult: Secondary | ICD-10-CM | POA: Diagnosis not present

## 2022-02-09 DIAGNOSIS — M5430 Sciatica, unspecified side: Secondary | ICD-10-CM | POA: Diagnosis present

## 2022-02-09 DIAGNOSIS — Z955 Presence of coronary angioplasty implant and graft: Secondary | ICD-10-CM

## 2022-02-09 DIAGNOSIS — L03115 Cellulitis of right lower limb: Secondary | ICD-10-CM | POA: Diagnosis present

## 2022-02-09 DIAGNOSIS — E119 Type 2 diabetes mellitus without complications: Secondary | ICD-10-CM | POA: Diagnosis present

## 2022-02-09 DIAGNOSIS — Z7982 Long term (current) use of aspirin: Secondary | ICD-10-CM

## 2022-02-09 DIAGNOSIS — Z7951 Long term (current) use of inhaled steroids: Secondary | ICD-10-CM | POA: Diagnosis not present

## 2022-02-09 DIAGNOSIS — E039 Hypothyroidism, unspecified: Secondary | ICD-10-CM | POA: Diagnosis present

## 2022-02-09 DIAGNOSIS — L0390 Cellulitis, unspecified: Secondary | ICD-10-CM | POA: Diagnosis present

## 2022-02-09 DIAGNOSIS — L03116 Cellulitis of left lower limb: Secondary | ICD-10-CM | POA: Diagnosis present

## 2022-02-09 DIAGNOSIS — J449 Chronic obstructive pulmonary disease, unspecified: Secondary | ICD-10-CM | POA: Diagnosis present

## 2022-02-09 DIAGNOSIS — Z8249 Family history of ischemic heart disease and other diseases of the circulatory system: Secondary | ICD-10-CM | POA: Diagnosis not present

## 2022-02-09 DIAGNOSIS — Z79899 Other long term (current) drug therapy: Secondary | ICD-10-CM | POA: Diagnosis not present

## 2022-02-09 DIAGNOSIS — I2510 Atherosclerotic heart disease of native coronary artery without angina pectoris: Secondary | ICD-10-CM | POA: Diagnosis present

## 2022-02-09 DIAGNOSIS — F17210 Nicotine dependence, cigarettes, uncomplicated: Secondary | ICD-10-CM | POA: Diagnosis present

## 2022-02-09 DIAGNOSIS — Z79890 Hormone replacement therapy (postmenopausal): Secondary | ICD-10-CM

## 2022-02-09 DIAGNOSIS — R531 Weakness: Secondary | ICD-10-CM | POA: Diagnosis present

## 2022-02-09 DIAGNOSIS — Z833 Family history of diabetes mellitus: Secondary | ICD-10-CM | POA: Diagnosis not present

## 2022-02-09 DIAGNOSIS — E860 Dehydration: Principal | ICD-10-CM | POA: Diagnosis present

## 2022-02-09 DIAGNOSIS — X30XXXA Exposure to excessive natural heat, initial encounter: Secondary | ICD-10-CM

## 2022-02-09 DIAGNOSIS — F32A Depression, unspecified: Secondary | ICD-10-CM | POA: Diagnosis present

## 2022-02-09 DIAGNOSIS — L03119 Cellulitis of unspecified part of limb: Secondary | ICD-10-CM | POA: Diagnosis not present

## 2022-02-09 DIAGNOSIS — I5032 Chronic diastolic (congestive) heart failure: Secondary | ICD-10-CM | POA: Diagnosis present

## 2022-02-09 DIAGNOSIS — R32 Unspecified urinary incontinence: Secondary | ICD-10-CM | POA: Diagnosis present

## 2022-02-09 DIAGNOSIS — I10 Essential (primary) hypertension: Secondary | ICD-10-CM

## 2022-02-09 DIAGNOSIS — N179 Acute kidney failure, unspecified: Secondary | ICD-10-CM | POA: Diagnosis present

## 2022-02-09 DIAGNOSIS — Z7984 Long term (current) use of oral hypoglycemic drugs: Secondary | ICD-10-CM

## 2022-02-09 DIAGNOSIS — R5381 Other malaise: Secondary | ICD-10-CM | POA: Diagnosis present

## 2022-02-09 LAB — BLOOD GAS, VENOUS
Acid-Base Excess: 2.4 mmol/L — ABNORMAL HIGH (ref 0.0–2.0)
Bicarbonate: 27.2 mmol/L (ref 20.0–28.0)
Drawn by: 6892
FIO2: 32 %
O2 Saturation: 82.3 %
Patient temperature: 36.8
pCO2, Ven: 42 mmHg — ABNORMAL LOW (ref 44–60)
pH, Ven: 7.42 (ref 7.25–7.43)
pO2, Ven: 48 mmHg — ABNORMAL HIGH (ref 32–45)

## 2022-02-09 LAB — CBC WITH DIFFERENTIAL/PLATELET
Abs Immature Granulocytes: 0.02 10*3/uL (ref 0.00–0.07)
Basophils Absolute: 0.1 10*3/uL (ref 0.0–0.1)
Basophils Relative: 1 %
Eosinophils Absolute: 0.2 10*3/uL (ref 0.0–0.5)
Eosinophils Relative: 2 %
HCT: 40.7 % (ref 36.0–46.0)
Hemoglobin: 12.7 g/dL (ref 12.0–15.0)
Immature Granulocytes: 0 %
Lymphocytes Relative: 12 %
Lymphs Abs: 1.2 10*3/uL (ref 0.7–4.0)
MCH: 28.3 pg (ref 26.0–34.0)
MCHC: 31.2 g/dL (ref 30.0–36.0)
MCV: 90.6 fL (ref 80.0–100.0)
Monocytes Absolute: 0.9 10*3/uL (ref 0.1–1.0)
Monocytes Relative: 10 %
Neutro Abs: 7 10*3/uL (ref 1.7–7.7)
Neutrophils Relative %: 75 %
Platelets: 261 10*3/uL (ref 150–400)
RBC: 4.49 MIL/uL (ref 3.87–5.11)
RDW: 14.6 % (ref 11.5–15.5)
WBC: 9.3 10*3/uL (ref 4.0–10.5)
nRBC: 0 % (ref 0.0–0.2)

## 2022-02-09 LAB — COMPREHENSIVE METABOLIC PANEL
ALT: 22 U/L (ref 0–44)
AST: 18 U/L (ref 15–41)
Albumin: 4 g/dL (ref 3.5–5.0)
Alkaline Phosphatase: 85 U/L (ref 38–126)
Anion gap: 8 (ref 5–15)
BUN: 19 mg/dL (ref 8–23)
CO2: 25 mmol/L (ref 22–32)
Calcium: 9.1 mg/dL (ref 8.9–10.3)
Chloride: 107 mmol/L (ref 98–111)
Creatinine, Ser: 1.06 mg/dL — ABNORMAL HIGH (ref 0.44–1.00)
GFR, Estimated: 58 mL/min — ABNORMAL LOW (ref >60)
Glucose, Bld: 104 mg/dL — ABNORMAL HIGH (ref 70–99)
Potassium: 3.9 mmol/L (ref 3.5–5.1)
Sodium: 140 mmol/L (ref 135–145)
Total Bilirubin: 0.6 mg/dL (ref 0.3–1.2)
Total Protein: 7.8 g/dL (ref 6.5–8.1)

## 2022-02-09 LAB — LACTIC ACID, PLASMA
Lactic Acid, Venous: 1.3 mmol/L (ref 0.5–1.9)
Lactic Acid, Venous: 1.2 mmol/L (ref 0.5–1.9)

## 2022-02-09 LAB — CBG MONITORING, ED: Glucose-Capillary: 118 mg/dL — ABNORMAL HIGH (ref 70–99)

## 2022-02-09 MED ORDER — OXYCODONE-ACETAMINOPHEN 5-325 MG PO TABS
1.0000 | ORAL_TABLET | Freq: Once | ORAL | Status: AC
  Administered 2022-02-09: 1 via ORAL
  Filled 2022-02-09: qty 1

## 2022-02-09 NOTE — ED Provider Notes (Addendum)
[facility] EMERGENCY DEPARTMENT Provider Note   CSN: [id number] Arrival date & time: 02/09/22  1646     History  Chief Complaint  Patient presents with   Weakness    Marie West is a 66 y.o. female COPD, diastolic CHF (EF 02-77%), HTN, T2DM not on insulin, hypothyroidism, arthritis, unstable angina, morbid obesity, fibromyalgia, history of MRSA infection, sciatica, retinal detachment, tobacco use who p/w generalized weakness.   Patient was recently admitted from 8/10-8/14 for acute diastolic CHF exacerbation, bilateral lower extremity cellulitis, and UTI.  Pain in lower back and legs and was seen by PT with recommendations for home health PT.  Treated with full course of Keflex completed on 8/20.  On my evaluation, patient is a poor historian and is having trouble relating exactly what occurred today. States that she left the house to go to a doctor's appointment but does not exactly remember what happened there.  Per care everywhere, patient was seen at [facility] where she was noted to have "impaired mobility and ADLs d/t generalized weakness." Also documented she has had "incontinence, disorientation, hallucinations." She was given a shot of rocephin, prescribed bactrim, and wound care referral. When she came home she has 8 steps leading up to her apartment that she lives in with her daughter but felt too generally weak and was too scared to go up them fearing she would fall and so sat outside on the bottom of the stairs for approximately 3 hours.  Her daughter was with her and was trying to get her help to go up but eventually called 911. EMS found patient to have urinated on herself and T 1060F. After being in air conditioning, her T decreased to 98.60F PTA.   Patient does not complain of any recent fevers or chills.  States that when she was discharged in the hospital her lower extremity cellulitis she felt improved for a while but recently got worse again, though she  cannot state exactly when. Daughter at bedside states that she is having trouble ambulating, getting up from her chair d/t pain in her legs. Endorses that her right lower extremity is chronically weak due to her sciatica, which is not new, and otherwise does not have any asymmetric numbness tingling or weakness, just generalized weakness. Denies chest pain, palpitations, nausea vomiting diarrhea constipation, abdominal pain, dysuria/hematuria, facial droop.  Does endorse intermittent shortness of breath without any cough. Has not hit her head recently.    The history is provided by the patient.       Home Medications Prior to Admission medications   Medication Sig Start Date End Date Taking? Authorizing Provider  acetaminophen (TYLENOL) 325 MG tablet Take 2 tablets (650 mg total) by mouth every 6 (six) hours as needed for mild pain, fever or headache (or Fever >/= 101). 08/01/20   Shon Hale, MD  albuterol (VENTOLIN HFA) 108 (90 Base) MCG/ACT inhaler Inhale 2 puffs into the lungs every 6 (six) hours as needed for wheezing or shortness of breath. 08/01/20   Shon Hale, MD  amitriptyline (ELAVIL) 25 MG tablet Take 25 mg by mouth at bedtime as needed for sleep. 08/24/20   [provider]  aspirin EC 81 MG tablet Take 1 tablet (81 mg total) by mouth daily with breakfast. 08/01/20   Shon Hale, MD  atorvastatin (LIPITOR) 80 MG tablet Take 1 tablet (80 mg total) by mouth daily. 08/09/21   Antoine Poche, MD  benzonatate (TESSALON) 200 MG capsule 1 capsule Orally  Three times a day for 30 day(s)    [provider]  budesonide-formoterol (SYMBICORT) 160-4.5 MCG/ACT inhaler Inhale 2 puffs into the lungs 2 (two) times daily. 08/01/20   Shon Hale, MD  buPROPion (ZYBAN) 150 MG 12 hr tablet Take 150 mg by mouth daily. 04/01/21   [provider]  carvedilol (COREG) 6.25 MG tablet Take 1 tablet (6.25 mg total) by mouth 2 (two) times daily with a meal. 08/09/21    Branch, Dorothe Pea, MD  cetirizine (ZYRTEC) 10 MG tablet 1 tablet Orally Once a day for 30 days    [provider]  cholecalciferol (VITAMIN D3) 25 MCG (1000 UNIT) tablet Take 1,000 Units by mouth daily.    [provider]  dapagliflozin propanediol (FARXIGA) 10 MG TABS tablet Take 1 tablet (10 mg total) by mouth daily. 08/09/21   Antoine Poche, MD  DULoxetine (CYMBALTA) 60 MG capsule Take 60 mg by mouth 2 (two) times daily.    [provider]  gabapentin (NEURONTIN) 300 MG capsule Take 300 mg by mouth 3 (three) times daily. 03/23/21   [provider]  ibuprofen (ADVIL) 200 MG tablet Take 800 mg by mouth every 6 (six) hours as needed for moderate pain.    [provider]  levothyroxine (EUTHYROX) 50 MCG tablet Take 50 mcg by mouth daily before breakfast.    [provider]  lisinopril (ZESTRIL) 20 MG tablet Take 20 mg by mouth daily. 12/12/21   [provider]  loratadine (CLARITIN) 10 MG tablet Take 10 mg by mouth daily.    [provider]  Melatonin 10 MG CAPS Take 10 mg by mouth at bedtime.    [provider]  metFORMIN (GLUCOPHAGE) 500 MG tablet Take 1 tablet (500 mg total) by mouth daily with breakfast. 08/01/20   Shon Hale, MD  oxyCODONE-acetaminophen (PERCOCET) 10-325 MG tablet Take 1 tablet by mouth 4 (four) times daily as needed. 01/11/22   [provider]  spironolactone (ALDACTONE) 25 MG tablet Take 1/2 tablet (12.5 mg total) by mouth daily. 08/09/21   Antoine Poche, MD  SSD 1 % cream Apply 1 Application topically 3 (three) times daily as needed (Foot). 12/14/21   [provider]  ticagrelor (BRILINTA) 90 MG TABS tablet Take 1 tablet (90 mg total) by mouth 2 (two) times daily. 08/09/21   Antoine Poche, MD  vitamin B-12 (CYANOCOBALAMIN) 1000 MCG tablet Take 1,000 mcg by mouth daily.    [provider]      Allergies    Bee venom    Review of Systems   Review of  Systems Review of systems negative for abd pain.  A 10 point review of systems was performed and is negative unless otherwise reported in HPI.  Physical Exam Updated Vital Signs BP (!) 116/104   Pulse 87   Temp 98.3 F (36.8 C) (Oral)   Resp (!) 21   Ht 5\' 1"  (1.549 m)   Wt (!) 138 kg   SpO2 98%   BMI 57.48 kg/m  Physical Exam General: chronically ill-appearing obese female, lying in bed.  HEENT: PERRLA, Sclera anicteric, MMM, trachea midline. Cardiology: RRR, no murmurs/rubs/gallops. BL radial and DP pulses equal bilaterally.  Resp: Normal respiratory rate and effort. CTAB, no wheezes, rhonchi, crackles.  Abd: Soft, non-tender, non-distended. No rebound tenderness or guarding.  GU: Deferred. MSK: Bilateral lower extremities are erythematous, warm, and swollen, diffusely tender to palpation.  No areas of obvious fluctuance.  2 x 2  centimeter ulceration on left posterior lower leg wrapped in a bandage.  Intact DP pulses bilaterally, 1+. No cyanosis or clubbing.  No signs of trauma. Skin: warm, dry. Neuro: A&Ox4, CNs II-XII grossly intact. Strength 4/5 in BL UEs, LLE. Strength 3/5 in RLE. Sensation grossly intact. No dysarthria. Intact repetition.  Psych: Normal mood and affect. Patient states she feels she is having trouble communicating and remembering what happened today.   ED Results / Procedures / Treatments   Labs (all labs ordered are listed, but only abnormal results are displayed) Labs Reviewed  COMPREHENSIVE METABOLIC PANEL - Abnormal; Notable for the following components:      Result Value   Glucose, Bld 104 (*)    Creatinine, Ser 1.06 (*)    GFR, Estimated 58 (*)    All other components within normal limits  BLOOD GAS, VENOUS - Abnormal; Notable for the following components:   pCO2, Ven 42 (*)    pO2, Ven 48 (*)    Acid-Base Excess 2.4 (*)    All other components within normal limits  CBG MONITORING, ED - Abnormal; Notable for the following components:    Glucose-Capillary 118 (*)    All other components within normal limits  LACTIC ACID, PLASMA  LACTIC ACID, PLASMA  CBC WITH DIFFERENTIAL/PLATELET  URINALYSIS, ROUTINE W REFLEX MICROSCOPIC    EKG EKG Interpretation  Date/Time:  Wednesday February 09 2022 17:26:52 EDT Ventricular Rate:  89 PR Interval:  168 QRS Duration: 91 QT Interval:  392 QTC Calculation: 477 R Axis:   105 Text Interpretation: Sinus rhythm Right axis deviation Low voltage, precordial leads Borderline repolarization abnormality Confirmed by Vivi Barrack [phone]) on 02/09/2022 6:58:46 PM  Radiology No results found.  Procedures Procedures    Medications Ordered in ED Medications  oxyCODONE-acetaminophen (PERCOCET/ROXICET) 5-325 MG per tablet 1 tablet (1 tablet Oral Given 02/09/22 1833)    ED Course/ Medical Decision Making/ A&P                           Medical Decision Making Amount and/or Complexity of Data Reviewed Labs: ordered.  Risk Prescription drug management. Decision regarding hospitalization.   Patient presents with generalized weakness and c/f worsening cellulitis of BL LEs. Consider broad ddx for generalized weakness, including infectious cause such as worsening of known cellulitis and failure of o/p treatment, electrolyte abnormalities, renal injury, anemia. Initial temp by EMS 103F likely d/t environmental hyperthermia as ambient temperature outside 100F today and it decreased to normal once patient was placed inside. No hypotension, tachycardia, hypoxia.  Patient reports confusion and trouble with memory; no new stroke-like symptoms including new weakness (RLE weak but patient states this is baseline), numbness/tingling, or facial droop. Consider infection, hypercarbia, ACS, polypharmacy, hyper/hypoglycemia.  Patient's legs appear cellulitic up to R thigh, patient states they are worsening, likely failure of o/p treatment.   Lab w/u significant for lactate 1.3, WBC 9.3, Hgb 12.7, creatinine  1.06, no electrolyte abnormalities.. Low c/f sepsis at this point. Patient received rocephin today at clinic.  Patient's lower extremity cellulitis and swelling appear to be worsening in the outpatient setting, and patient has now developed intermittent confusion/hallucinations per the daughter. Generalized weakness now unable to ambulate effectively or complete her ADLs well. The patient will benefit from inpatient antibiotics and monitoring, with possible placement. Consulted to hospitalist.  Dispo: admit          Final Clinical Impression(s) / ED Diagnoses Final diagnoses:  Weakness  Cellulitis of  lower extremity, unspecified laterality    Rx / DC Orders ED Discharge Orders     None          Loetta Rough, MD 02/09/22 2140

## 2022-02-09 NOTE — ED Triage Notes (Signed)
Pt called 911 after sitting outside in the heat for 3+ hours. She reports weakness d/t cellulitis in her legs. Sitting outside she urinated on herself and reached a temp of 103. Temp is back to baseline 98.3 after being inside.

## 2022-02-09 NOTE — H&P (Addendum)
History and Physical    Patient: Marie West UJW:[id number] DOB: 02/07/1956 DOA: 02/09/2022 DOS: the patient was seen and examined on 02/10/2022 PCP: Waldon Reining, MD  Patient coming from: Home  Chief Complaint:  Chief Complaint  Patient presents with   Weakness   HPI: Marie West is a 66 y.o. female with medical history significant of COPD, diastolic CHF, type 2 diabetes mellitus, obesity, hypothyroidism who presents to the emergency department due to 2-3 day onset of generalized weakness which worsened today. Patient was unable to provide detailed history about the weakness, some of the history was obtained from daughter at bedside and from ED physician and ED medical record.  Per report, daughter states that patient has been complaining of weakness since the last 2 days and also complained of increased confusion and disorientation.  She went to see her PCP this morning and she was given Rocephin and Bactrim was prescribed due to concern for recurrent cellulitis.  On arrival at home, patient felt so weak and was afraid of going up the 8 steps leading to apartment due to fear of falling, so she sat at the bottom of the stairs for about 3 hours.  Daughter tried to help patient to go up the stairs but was unable, so, EMS was eventually activated and on arrival of EMS team, she was noted to be febrile with a temperature of 1018F and she also had incontinence.  After patient was placed in a room with air conditioning, temperature improved to 98.18F. She denies fever, chills, shortness of breath, nausea, vomiting or abdominal pain. Patient was recently admitted from 8/10 to 8/14 due to acute exacerbation of diastolic CHF, UTI and bilateral lower extremity cellulitis which was treated with IV antibiotics and was discharged with Keflex till 8/20 to complete the antibiotic regimen.   ED Course:  In the emergency department, she was intermittently tachypneic, but other vital signs were within  normal range.  Work-up in the ED showed normal CBC and BMP, lactic acid was negative. She was treated with Percocet, hospitalist was asked to admit patient for further evaluation and management.  Review of Systems: Review of systems as noted in the HPI. All other systems reviewed and are negative.   Past Medical History:  Diagnosis Date   Acute respiratory failure (HCC)    Arthritis    Bronchitis    CHF (congestive heart failure) (HCC)    COPD with exacerbation (HCC)    Fibromyalgia    Headache(784.0)    Hypertension    Hypertensive urgency    Hyperthyroidism    MI (myocardial infarction) (HCC)    MRSA infection    Pneumonia    Retinal detachment    Sciatic leg pain    Shortness of breath    Past Surgical History:  Procedure Laterality Date   CORONARY STENT INTERVENTION N/A 04/05/2021   Procedure: CORONARY STENT INTERVENTION;  Surgeon: Orbie Pyo, MD;  Location: [facility] INVASIVE CV LAB;  Service: Cardiovascular;  Laterality: N/A;   KNEE SURGERY     LEFT HEART CATH AND CORONARY ANGIOGRAPHY N/A 04/05/2021   Procedure: LEFT HEART CATH AND CORONARY ANGIOGRAPHY;  Surgeon: Orbie Pyo, MD;  Location: [facility] INVASIVE CV LAB;  Service: Cardiovascular;  Laterality: N/A;   TUBAL LIGATION      Social History:  reports that she has been smoking cigarettes. She started smoking about 50 years ago. She has a 18.00 pack-year smoking history. She has never used smokeless tobacco. She reports current alcohol  use. She reports that she does not use drugs.   Allergies  Allergen Reactions   Bee Venom Swelling    Family History  Problem Relation Age of Onset   Diabetes Mother    Heart disease Mother        died AMI, age 31   Heart disease Brother    Diabetes Sister      Prior to Admission medications   Medication Sig Start Date End Date Taking? Authorizing Provider  acetaminophen (TYLENOL) 325 MG tablet Take 2 tablets (650 mg total) by mouth every 6 (six) hours as needed for mild  pain, fever or headache (or Fever >/= 101). 08/01/20   Shon Hale, MD  albuterol (VENTOLIN HFA) 108 (90 Base) MCG/ACT inhaler Inhale 2 puffs into the lungs every 6 (six) hours as needed for wheezing or shortness of breath. 08/01/20   Shon Hale, MD  amitriptyline (ELAVIL) 25 MG tablet Take 25 mg by mouth at bedtime as needed for sleep. 08/24/20   [provider]  aspirin EC 81 MG tablet Take 1 tablet (81 mg total) by mouth daily with breakfast. 08/01/20   Shon Hale, MD  atorvastatin (LIPITOR) 80 MG tablet Take 1 tablet (80 mg total) by mouth daily. 08/09/21   Antoine Poche, MD  benzonatate (TESSALON) 200 MG capsule 1 capsule Orally Three times a day for 30 day(s)    [provider]  budesonide-formoterol (SYMBICORT) 160-4.5 MCG/ACT inhaler Inhale 2 puffs into the lungs 2 (two) times daily. 08/01/20   Shon Hale, MD  buPROPion (ZYBAN) 150 MG 12 hr tablet Take 150 mg by mouth daily. 04/01/21   [provider]  carvedilol (COREG) 6.25 MG tablet Take 1 tablet (6.25 mg total) by mouth 2 (two) times daily with a meal. 08/09/21   Branch, Dorothe Pea, MD  cetirizine (ZYRTEC) 10 MG tablet 1 tablet Orally Once a day for 30 days    [provider]  cholecalciferol (VITAMIN D3) 25 MCG (1000 UNIT) tablet Take 1,000 Units by mouth daily.    [provider]  dapagliflozin propanediol (FARXIGA) 10 MG TABS tablet Take 1 tablet (10 mg total) by mouth daily. 08/09/21   Antoine Poche, MD  DULoxetine (CYMBALTA) 60 MG capsule Take 60 mg by mouth 2 (two) times daily.    [provider]  gabapentin (NEURONTIN) 300 MG capsule Take 300 mg by mouth 3 (three) times daily. 03/23/21   [provider]  ibuprofen (ADVIL) 200 MG tablet Take 800 mg by mouth every 6 (six) hours as needed for moderate pain.    [provider]  levothyroxine (EUTHYROX) 50 MCG tablet Take 50 mcg by mouth daily before breakfast.    [provider]   lisinopril (ZESTRIL) 20 MG tablet Take 20 mg by mouth daily. 12/12/21   [provider]  loratadine (CLARITIN) 10 MG tablet Take 10 mg by mouth daily.    [provider]  Melatonin 10 MG CAPS Take 10 mg by mouth at bedtime.    [provider]  metFORMIN (GLUCOPHAGE) 500 MG tablet Take 1 tablet (500 mg total) by mouth daily with breakfast. 08/01/20   Shon Hale, MD  oxyCODONE-acetaminophen (PERCOCET) 10-325 MG tablet Take 1 tablet by mouth 4 (four) times daily as needed. 01/11/22   [provider]  spironolactone (ALDACTONE) 25 MG tablet Take 1/2 tablet (12.5 mg total) by mouth daily. 08/09/21   Antoine Poche, MD  SSD 1 % cream Apply 1 Application topically 3 (three)  times daily as needed (Foot). 12/14/21   [provider]  ticagrelor (BRILINTA) 90 MG TABS tablet Take 1 tablet (90 mg total) by mouth 2 (two) times daily. 08/09/21   Antoine Poche, MD  vitamin B-12 (CYANOCOBALAMIN) 1000 MCG tablet Take 1,000 mcg by mouth daily.    [provider]    Physical Exam: BP (!) 116/104   Pulse 87   Temp 98.3 F (36.8 C) (Oral)   Resp (!) 21   Ht 5\' 1"  (1.549 m)   Wt (!) 138 kg   SpO2 98%   BMI 57.48 kg/m   General: 66 y.o. year-old obese female well developed well nourished in no acute distress.  Alert and oriented x3. HEENT: NCAT, EOMI, dry mucous membranes Neck: Supple, trachea medial Cardiovascular: Regular rate and rhythm with no rubs or gallops.  No thyromegaly or JVD noted.  No lower extremity edema. 2/4 pulses in all 4 extremities. Respiratory: Clear to auscultation with no wheezes or rales. Good inspiratory effort. Abdomen: Soft, nontender nondistended with normal bowel sounds x4 quadrants. Muskuloskeletal: Bilateral lower extremity with erythema which was warm to touch.  No cyanosis or clubbing noted bilaterally Neuro: CN II-XII intact, strength 4/5 in upper extremity bilaterally and 3/5 in lower extremity bilaterally.  sensation, reflexes intact Skin: Bilateral lower extremity with erythema which was warm to touch. Psychiatry: Mood is appropriate for condition and setting          Labs on Admission:  Basic Metabolic Panel: Recent Labs  Lab 02/09/22 1900  NA 140  K 3.9  CL 107  CO2 25  GLUCOSE 104*  BUN 19  CREATININE 1.06*  CALCIUM 9.1   Liver Function Tests: Recent Labs  Lab 02/09/22 1900  AST 18  ALT 22  ALKPHOS 85  BILITOT 0.6  PROT 7.8  ALBUMIN 4.0   No results for input(s): "LIPASE", "AMYLASE" in the last 168 hours. No results for input(s): "AMMONIA" in the last 168 hours. CBC: Recent Labs  Lab 02/09/22 1900  WBC 9.3  NEUTROABS 7.0  HGB 12.7  HCT 40.7  MCV 90.6  PLT 261   Cardiac Enzymes: No results for input(s): "CKTOTAL", "CKMB", "CKMBINDEX", "TROPONINI" in the last 168 hours.  BNP (last 3 results) Recent Labs    01/27/22 1551  BNP 32.0    ProBNP (last 3 results) No results for input(s): "PROBNP" in the last 8760 hours.  CBG: Recent Labs  Lab 02/09/22 2009  GLUCAP 118*    Radiological Exams on Admission: No results found.  EKG: I independently viewed the EKG done and my findings are as followed: Normal sinus rhythm at a rate of 89 bpm  Assessment/Plan Present on Admission:  CAD (coronary artery disease)  Cellulitis  COPD (chronic obstructive pulmonary disease) (HCC)  Depression  Principal Problem:   Generalized weakness Active Problems:   COPD (chronic obstructive pulmonary disease) (HCC)   CAD (coronary artery disease)   Morbid obesity with BMI of 50.0-59.9, adult (HCC)   Essential hypertension   Depression   Acquired hypothyroidism   Cellulitis   Heat exhaustion   Dehydration  Generalized weakness possibly secondary to multifactorial Continue fall precaution and neurochecks Continue PT/OT eval and treat  Recurrent bilateral lower extremity cellulitis Patient was seen by PCP today and Rocephin was given in the ED, Bactrim was  prescribed for continuous management of cellulitis Continue IV ceftriaxone  Heat exhaustion-resolved  Dehydration Continue IV hydration  History of diastolic CHF Echocardiogram done on 01/28/2022 showed LVEF of 55  to 60%.  No RWMA.  RV systolic function not well-visualized.  RV size not well-visualized.  CAD s/p stent placement Cardiac cath with placement of DES stent for first marginal lesion 80% stenosis (10/22) Patient denies any chest pain Continue aspirin, Brilinta, Coreg, statins  COPD (not in acute exacerbation) Continue DuoNebs as needed Continue Dulera  Type 2 diabetes mellitus Last A1c on 01/27/2022 was 6.0 Continue ISS and hypoglycemia protocol Metformin and Marcelline Deist will be temporarily held at this time  Essential hypertension Continue Coreg, lisinopril  Morbid obesity (BMI 57.48) Continue diet and lifestyle modification  Depression Continue Cymbalta  Hypothyroidism Continue Synthroid  DVT prophylaxis: Lovenox   Code Status: Full code  Consults: None  Family Communication: Daughter at bedside (all questions answered to satisfaction)  Severity of Illness: The appropriate patient status for this patient is INPATIENT. Inpatient status is judged to be reasonable and necessary in order to provide the required intensity of service to ensure the patient's safety. The patient's presenting symptoms, physical exam findings, and initial radiographic and laboratory data in the context of their chronic comorbidities is felt to place them at high risk for further clinical deterioration. Furthermore, it is not anticipated that the patient will be medically stable for discharge from the hospital within 2 midnights of admission.   * I certify that at the point of admission it is my clinical judgment that the patient will require inpatient hospital care spanning beyond 2 midnights from the point of admission due to high intensity of service, high risk for further deterioration  and high frequency of surveillance required.*  Author: Frankey Shown, DO 02/10/2022 1:25 AM  For on call review [email or web address].

## 2022-02-10 DIAGNOSIS — E860 Dehydration: Secondary | ICD-10-CM

## 2022-02-10 DIAGNOSIS — T675XXA Heat exhaustion, unspecified, initial encounter: Secondary | ICD-10-CM

## 2022-02-10 DIAGNOSIS — R531 Weakness: Secondary | ICD-10-CM | POA: Diagnosis not present

## 2022-02-10 LAB — URINALYSIS, ROUTINE W REFLEX MICROSCOPIC
Bilirubin Urine: NEGATIVE
Glucose, UA: 50 mg/dL — AB
Hgb urine dipstick: NEGATIVE
Ketones, ur: 5 mg/dL — AB
Leukocytes,Ua: NEGATIVE
Nitrite: NEGATIVE
Protein, ur: NEGATIVE mg/dL
Specific Gravity, Urine: 1.019 (ref 1.005–1.030)
pH: 5 (ref 5.0–8.0)

## 2022-02-10 LAB — COMPREHENSIVE METABOLIC PANEL
ALT: 20 U/L (ref 0–44)
AST: 25 U/L (ref 15–41)
Albumin: 3.6 g/dL (ref 3.5–5.0)
Alkaline Phosphatase: 70 U/L (ref 38–126)
Anion gap: 8 (ref 5–15)
BUN: 17 mg/dL (ref 8–23)
CO2: 23 mmol/L (ref 22–32)
Calcium: 8.6 mg/dL — ABNORMAL LOW (ref 8.9–10.3)
Chloride: 107 mmol/L (ref 98–111)
Creatinine, Ser: 0.83 mg/dL (ref 0.44–1.00)
GFR, Estimated: >60 mL/min (ref >60)
Glucose, Bld: 109 mg/dL — ABNORMAL HIGH (ref 70–99)
Potassium: 4.2 mmol/L (ref 3.5–5.1)
Sodium: 138 mmol/L (ref 135–145)
Total Bilirubin: 0.8 mg/dL (ref 0.3–1.2)
Total Protein: 6.9 g/dL (ref 6.5–8.1)

## 2022-02-10 LAB — CBC
HCT: 37.5 % (ref 36.0–46.0)
Hemoglobin: 11.8 g/dL — ABNORMAL LOW (ref 12.0–15.0)
MCH: 28.3 pg (ref 26.0–34.0)
MCHC: 31.5 g/dL (ref 30.0–36.0)
MCV: 89.9 fL (ref 80.0–100.0)
Platelets: 232 10*3/uL (ref 150–400)
RBC: 4.17 MIL/uL (ref 3.87–5.11)
RDW: 14.5 % (ref 11.5–15.5)
WBC: 7.8 10*3/uL (ref 4.0–10.5)
nRBC: 0 % (ref 0.0–0.2)

## 2022-02-10 LAB — GLUCOSE, CAPILLARY
Glucose-Capillary: 219 mg/dL — ABNORMAL HIGH (ref 70–99)
Glucose-Capillary: 87 mg/dL (ref 70–99)
Glucose-Capillary: 116 mg/dL — ABNORMAL HIGH (ref 70–99)

## 2022-02-10 LAB — CBG MONITORING, ED: Glucose-Capillary: 105 mg/dL — ABNORMAL HIGH (ref 70–99)

## 2022-02-10 MED ORDER — LEVOTHYROXINE SODIUM 50 MCG PO TABS
50.0000 ug | ORAL_TABLET | Freq: Every day | ORAL | Status: DC
Start: 2022-02-10 — End: 2022-02-10
  Filled 2022-02-10: qty 1

## 2022-02-10 MED ORDER — NYSTATIN 100000 UNIT/GM EX POWD
Freq: Three times a day (TID) | CUTANEOUS | Status: DC
  Filled 2022-02-10: qty 15

## 2022-02-10 MED ORDER — LISINOPRIL 10 MG PO TABS
20.0000 mg | ORAL_TABLET | Freq: Every day | ORAL | Status: DC
  Administered 2022-02-10 – 2022-02-12 (×3): 20 mg via ORAL
  Filled 2022-02-10 (×3): qty 2

## 2022-02-10 MED ORDER — GABAPENTIN 300 MG PO CAPS
300.0000 mg | ORAL_CAPSULE | Freq: Three times a day (TID) | ORAL | Status: DC
  Administered 2022-02-10 – 2022-02-12 (×8): 300 mg via ORAL
  Filled 2022-02-10 (×8): qty 1

## 2022-02-10 MED ORDER — MOMETASONE FURO-FORMOTEROL FUM 200-5 MCG/ACT IN AERO
2.0000 | INHALATION_SPRAY | Freq: Two times a day (BID) | RESPIRATORY_TRACT | Status: DC
  Administered 2022-02-10 – 2022-02-12 (×5): 2 via RESPIRATORY_TRACT
  Filled 2022-02-10: qty 8.8

## 2022-02-10 MED ORDER — OXYCODONE-ACETAMINOPHEN 5-325 MG PO TABS
2.0000 | ORAL_TABLET | Freq: Four times a day (QID) | ORAL | Status: DC | PRN
  Administered 2022-02-10 – 2022-02-12 (×6): 2 via ORAL
  Filled 2022-02-10 (×6): qty 2

## 2022-02-10 MED ORDER — TICAGRELOR 90 MG PO TABS
90.0000 mg | ORAL_TABLET | Freq: Two times a day (BID) | ORAL | Status: DC
Start: 2022-02-10 — End: 2022-02-12
  Administered 2022-02-10 – 2022-02-12 (×5): 90 mg via ORAL
  Filled 2022-02-10 (×5): qty 1

## 2022-02-10 MED ORDER — SPIRONOLACTONE 12.5 MG HALF TABLET
12.5000 mg | ORAL_TABLET | Freq: Every day | ORAL | Status: DC
  Administered 2022-02-10 – 2022-02-12 (×3): 12.5 mg via ORAL
  Filled 2022-02-10 (×3): qty 1

## 2022-02-10 MED ORDER — LORATADINE 10 MG PO TABS
10.0000 mg | ORAL_TABLET | Freq: Every day | ORAL | Status: DC
  Administered 2022-02-10 – 2022-02-12 (×3): 10 mg via ORAL
  Filled 2022-02-10 (×3): qty 1

## 2022-02-10 MED ORDER — CARVEDILOL 3.125 MG PO TABS
6.2500 mg | ORAL_TABLET | Freq: Two times a day (BID) | ORAL | Status: DC
  Administered 2022-02-10 – 2022-02-12 (×6): 6.25 mg via ORAL
  Filled 2022-02-10 (×6): qty 2

## 2022-02-10 MED ORDER — ATORVASTATIN CALCIUM 40 MG PO TABS
80.0000 mg | ORAL_TABLET | Freq: Every day | ORAL | Status: DC
  Administered 2022-02-10 – 2022-02-12 (×3): 80 mg via ORAL
  Filled 2022-02-10 (×3): qty 2

## 2022-02-10 MED ORDER — ACETAMINOPHEN 325 MG PO TABS
650.0000 mg | ORAL_TABLET | Freq: Four times a day (QID) | ORAL | Status: DC | PRN
  Administered 2022-02-10 – 2022-02-12 (×4): 650 mg via ORAL
  Filled 2022-02-10 (×5): qty 2

## 2022-02-10 MED ORDER — SODIUM CHLORIDE 0.9 % IV SOLN: 2.0000 g | INTRAVENOUS | Status: DC

## 2022-02-10 MED ORDER — INSULIN ASPART 100 UNIT/ML IJ SOLN
0.0000 [IU] | Freq: Three times a day (TID) | INTRAMUSCULAR | Status: DC
  Administered 2022-02-10 – 2022-02-11 (×2): 5 [IU] via SUBCUTANEOUS
  Administered 2022-02-12: 3 [IU] via SUBCUTANEOUS
  Administered 2022-02-12: 2 [IU] via SUBCUTANEOUS

## 2022-02-10 MED ORDER — SODIUM CHLORIDE 0.9 % IV SOLN: INTRAVENOUS | Status: AC

## 2022-02-10 MED ORDER — ENOXAPARIN SODIUM 40 MG/0.4ML IJ SOSY
40.0000 mg | PREFILLED_SYRINGE | INTRAMUSCULAR | Status: DC
  Administered 2022-02-10 – 2022-02-12 (×3): 40 mg via SUBCUTANEOUS
  Filled 2022-02-10 (×3): qty 0.4

## 2022-02-10 MED ORDER — IPRATROPIUM-ALBUTEROL 0.5-2.5 (3) MG/3ML IN SOLN: 3.0000 mL | RESPIRATORY_TRACT | Status: DC | PRN

## 2022-02-10 MED ORDER — SODIUM CHLORIDE 0.9 % IV SOLN
2.0000 g | INTRAVENOUS | Status: DC
  Administered 2022-02-10 – 2022-02-12 (×3): 2 g via INTRAVENOUS
  Filled 2022-02-10 (×3): qty 20

## 2022-02-10 MED ORDER — AMITRIPTYLINE HCL 25 MG PO TABS
25.0000 mg | ORAL_TABLET | Freq: Every evening | ORAL | Status: DC | PRN
Start: 2022-02-10 — End: 2022-02-12

## 2022-02-10 MED ORDER — ACETAMINOPHEN 650 MG RE SUPP: 650.0000 mg | Freq: Four times a day (QID) | RECTAL | Status: DC | PRN

## 2022-02-10 MED ORDER — LEVOTHYROXINE SODIUM 50 MCG PO TABS
50.0000 ug | ORAL_TABLET | Freq: Every day | ORAL | Status: DC
  Administered 2022-02-10 – 2022-02-12 (×3): 50 ug via ORAL
  Filled 2022-02-10 (×2): qty 1

## 2022-02-10 MED ORDER — ASPIRIN 81 MG PO TBEC
81.0000 mg | DELAYED_RELEASE_TABLET | Freq: Every day | ORAL | Status: DC
  Administered 2022-02-10 – 2022-02-12 (×3): 81 mg via ORAL
  Filled 2022-02-10 (×3): qty 1

## 2022-02-10 MED ORDER — BUPROPION HCL ER (SR) 150 MG PO TB12
150.0000 mg | ORAL_TABLET | Freq: Two times a day (BID) | ORAL | Status: DC
  Administered 2022-02-10 – 2022-02-12 (×5): 150 mg via ORAL
  Filled 2022-02-10 (×5): qty 1

## 2022-02-10 MED ORDER — DULOXETINE HCL 60 MG PO CPEP
60.0000 mg | ORAL_CAPSULE | Freq: Two times a day (BID) | ORAL | Status: DC
  Administered 2022-02-10 – 2022-02-12 (×5): 60 mg via ORAL
  Filled 2022-02-10 (×5): qty 1

## 2022-02-10 NOTE — [place] FL2 (Signed)
[place] MEDICAID FL2 LEVEL OF CARE SCREENING TOOL     IDENTIFICATION  Patient Name: Marie West Birthdate: 10-22-1955 Sex: female Admission Date (Current Location): 02/09/2022  [facility] and [place] Number:  [employer] and Address:  [facility],  [address] [place] [address]      Provider Number: [id number]  Attending Physician Name and Address:  Frankey Shown, DO  Relative Name and Phone Number:       Current Level of Care: Hospital Recommended Level of Care: Skilled Nursing Facility Prior Approval Number:    Date Approved/Denied:   PASRR Number: [id number]  Discharge Plan: SNF    Current Diagnoses: Patient Active Problem List   Diagnosis Date Noted   Heat exhaustion 02/10/2022   Dehydration 02/10/2022   Generalized weakness 02/09/2022   Cellulitis 01/27/2022   CAD (coronary artery disease) 01/27/2022   UTI (urinary tract infection) 01/27/2022   Unstable angina pectoris (HCC) 04/05/2021   Pneumonia due to COVID-19 virus 07/30/2020   COPD exacerbation (HCC) 07/29/2020   Acute diastolic CHF (congestive heart failure) (HCC) 07/29/2020   COPD (chronic obstructive pulmonary disease) (HCC) 07/29/2020   Acute dyspnea 11/17/2015   Depression 11/17/2015   Essential hypertension 11/17/2015   Non-insulin dependent type 2 diabetes mellitus (HCC) 11/17/2015   Acquired hypothyroidism 11/17/2015   Acute exacerbation of chronic obstructive pulmonary disease (COPD) (HCC)    Hypoxia    CAP (community acquired pneumonia) 07/09/2013   Acute respiratory failure (HCC) 07/09/2013   Pleural effusion 07/09/2013   Hypertensive urgency 07/09/2013   Acute respiratory failure with hypoxia (HCC) 07/09/2013   COPD with exacerbation (HCC) 06/01/2012   Tobacco use 06/01/2012   Morbid obesity with BMI of 50.0-59.9, adult (HCC) 06/01/2012   Arthritis 06/01/2012   possible CHF (congestive heart failure) 06/01/2012    Orientation RESPIRATION BLADDER  Height & Weight     Self, Time, Situation, Place  Normal Incontinent Weight: 300 lb (136.1 kg) Height:  5' (152.4 cm)  BEHAVIORAL SYMPTOMS/MOOD NEUROLOGICAL BOWEL NUTRITION STATUS      Continent Diet (see dc summary)  AMBULATORY STATUS COMMUNICATION OF NEEDS Skin   Extensive Assist Verbally Normal                       Personal Care Assistance Level of Assistance  Bathing, Feeding, Dressing Bathing Assistance: Limited assistance Feeding assistance: Independent Dressing Assistance: Limited assistance     Functional Limitations Info  Sight, Hearing, Speech Sight Info: Adequate Hearing Info: Adequate Speech Info: Adequate    SPECIAL CARE FACTORS FREQUENCY  PT (By licensed PT), OT (By licensed OT)     PT Frequency: 5x week OT Frequency: 3x week            Contractures Contractures Info: Not present    Additional Factors Info  Code Status, Allergies Code Status Info: Full Allergies Info: Bee Venom           Current Medications (02/10/2022):  This is the current hospital active medication list Current Facility-Administered Medications  Medication Dose Route Frequency Provider Last Rate Last Admin   acetaminophen (TYLENOL) tablet 650 mg  650 mg Oral Q6H PRN Adefeso, Oladapo, DO       Or   acetaminophen (TYLENOL) suppository 650 mg  650 mg Rectal Q6H PRN Adefeso, Oladapo, DO       amitriptyline (ELAVIL) tablet 25 mg  25 mg Oral QHS PRN Sherryll Burger, Pratik D, DO       aspirin EC tablet  81 mg  81 mg Oral Q breakfast Adefeso, Oladapo, DO   81 mg at 02/10/22 0852   atorvastatin (LIPITOR) tablet 80 mg  80 mg Oral Daily Adefeso, Oladapo, DO   80 mg at 02/10/22 1238   buPROPion (WELLBUTRIN SR) 12 hr tablet 150 mg  150 mg Oral BID Sherryll Burger, Pratik D, DO   150 mg at 02/10/22 1238   carvedilol (COREG) tablet 6.25 mg  6.25 mg Oral BID WC Adefeso, Oladapo, DO   6.25 mg at 02/10/22 0851   cefTRIAXone (ROCEPHIN) 2 g in sodium chloride 0.9 % 100 mL IVPB  2 g Intravenous Q24H Adefeso,  Oladapo, DO   Stopped at 02/10/22 0557   DULoxetine (CYMBALTA) DR capsule 60 mg  60 mg Oral BID Adefeso, Oladapo, DO   60 mg at 02/10/22 1238   enoxaparin (LOVENOX) injection 40 mg  40 mg Subcutaneous Q24H Adefeso, Oladapo, DO   40 mg at 02/10/22 1239   gabapentin (NEURONTIN) capsule 300 mg  300 mg Oral TID Maurilio Lovely D, DO   300 mg at 02/10/22 1238   insulin aspart (novoLOG) injection 0-15 Units  0-15 Units Subcutaneous TID WC Adefeso, Oladapo, DO   5 Units at 02/10/22 1239   ipratropium-albuterol (DUONEB) 0.5-2.5 (3) MG/3ML nebulizer solution 3 mL  3 mL Nebulization Q4H PRN Adefeso, Oladapo, DO       levothyroxine (SYNTHROID) tablet 50 mcg  50 mcg Oral QAC breakfast Sherryll Burger, Pratik D, DO   50 mcg at 02/10/22 0852   lisinopril (ZESTRIL) tablet 20 mg  20 mg Oral Daily Adefeso, Oladapo, DO   20 mg at 02/10/22 1238   loratadine (CLARITIN) tablet 10 mg  10 mg Oral Daily Sherryll Burger, Pratik D, DO   10 mg at 02/10/22 1239   mometasone-formoterol (DULERA) 200-5 MCG/ACT inhaler 2 puff  2 puff Inhalation BID Adefeso, Oladapo, DO   2 puff at 02/10/22 0759   oxyCODONE-acetaminophen (PERCOCET/ROXICET) 5-325 MG per tablet 2 tablet  2 tablet Oral QID PRN Maurilio Lovely D, DO   2 tablet at 02/10/22 7564   spironolactone (ALDACTONE) tablet 12.5 mg  12.5 mg Oral Daily Sherryll Burger, Pratik D, DO   12.5 mg at 02/10/22 1239   ticagrelor (BRILINTA) tablet 90 mg  90 mg Oral BID Adefeso, Oladapo, DO   90 mg at 02/10/22 1238     Discharge Medications: Please see discharge summary for a list of discharge medications.  Relevant Imaging Results:  Relevant Lab Results:   Additional Information SSN: [id number], [place]

## 2022-02-10 NOTE — Progress Notes (Signed)
PROGRESS NOTE    Marie West  QMG:[id number] DOB: 16-Feb-1956 DOA: 02/09/2022 PCP: Waldon Reining, MD   Brief Narrative:     Marie West is a 66 y.o. female with medical history significant of COPD, diastolic CHF, type 2 diabetes mellitus, obesity, hypothyroidism who presents to the emergency department due to 2-3 day onset of generalized weakness which worsened today.  Assessment & Plan:   Principal Problem:   Generalized weakness Active Problems:   COPD (chronic obstructive pulmonary disease) (HCC)   CAD (coronary artery disease)   Morbid obesity with BMI of 50.0-59.9, adult (HCC)   Essential hypertension   Depression   Acquired hypothyroidism   Cellulitis   Heat exhaustion   Dehydration  Assessment and Plan:   Generalized weakness possibly secondary to multifactorial Continue fall precaution and neurochecks PT recommending SNF which patient is agreeable to   Recurrent bilateral lower extremity cellulitis Patient was seen by PCP today and Rocephin was given in the ED, Bactrim was prescribed for continuous management of cellulitis Continue IV ceftriaxone   Heat exhaustion-resolved   Dehydration No longer dehydrated, discontinue IV fluid and start home spironolactone   History of diastolic CHF Echocardiogram done on 01/28/2022 showed LVEF of 55 to 60%.  No RWMA.  RV systolic function not well-visualized.  RV size not well-visualized.   CAD s/p stent placement Cardiac cath with placement of DES stent for first marginal lesion 80% stenosis (10/22) Patient denies any chest pain Continue aspirin, Brilinta, Coreg, statins   COPD (not in acute exacerbation) Continue DuoNebs as needed Continue Dulera   Type 2 diabetes mellitus Last A1c on 01/27/2022 was 6.0 Continue ISS and hypoglycemia protocol Metformin and Marcelline Deist will be temporarily held at this time   Essential hypertension Continue Coreg, lisinopril   Morbid obesity (BMI 57.48) Continue diet and  lifestyle modification   Depression Continue Cymbalta   Hypothyroidism Continue Synthroid    DVT prophylaxis:Lovenox Code Status: Full Family Communication: None at bedside Disposition Plan:  Status is: Inpatient Remains inpatient appropriate because: Need for IV antibiotics.   Consultants:  None  Procedures:  None  Antimicrobials:  Anti-infectives (From admission, onward)    Start     Dose/Rate Route Frequency Ordered Stop   02/10/22 1000  cefTRIAXone (ROCEPHIN) 2 g in sodium chloride 0.9 % 100 mL IVPB  Status:  Discontinued        2 g 200 mL/hr over 30 Minutes Intravenous Every 24 hours 02/10/22 0109 02/10/22 0451   02/10/22 0500  cefTRIAXone (ROCEPHIN) 2 g in sodium chloride 0.9 % 100 mL IVPB        2 g 200 mL/hr over 30 Minutes Intravenous Every 24 hours 02/10/22 0451 02/17/22 0459   02/10/22 0115  cefTRIAXone (ROCEPHIN) 2 g in sodium chloride 0.9 % 100 mL IVPB  Status:  Discontinued        2 g 200 mL/hr over 30 Minutes Intravenous Every 24 hours 02/10/22 0108 02/10/22 0109      Subjective: Patient seen and evaluated today with difficulty with movement.  Continues to have some ongoing pain to her lower extremities.  Objective: Vitals:   02/10/22 0722 02/10/22 0759 02/10/22 1115 02/10/22 1127  BP: 116/88  (!) 123/92   Pulse: 77  70   Resp: 19  18   Temp: 98.2 F (36.8 C)  98.8 F (37.1 C)   TempSrc: Oral  Oral   SpO2: 100% 100% 97%   Weight:    136.1 kg  Height:  5' (1.524 m)    Intake/Output Summary (Last 24 hours) at 02/10/2022 1326 Last data filed at 02/10/2022 0732 Gross per 24 hour  Intake 231.67 ml  Output --  Net 231.67 ml   Filed Weights   02/09/22 1758 02/10/22 1127  Weight: (!) 138 kg 136.1 kg    Examination:  General exam: Appears calm and comfortable, morbidly obese Respiratory system: Clear to auscultation. Respiratory effort normal. Cardiovascular system: S1 & S2 heard, RRR.  Gastrointestinal system: Abdomen is soft Central  nervous system: Alert and awake Extremities: Bilateral lower extremity edema with erythema Skin: No significant lesions noted Psychiatry: Flat affect.    Data Reviewed: I have personally reviewed following labs and imaging studies  CBC: Recent Labs  Lab 02/09/22 1900 02/10/22 0539  WBC 9.3 7.8  NEUTROABS 7.0  --   HGB 12.7 11.8*  HCT 40.7 37.5  MCV 90.6 89.9  PLT 261 232   Basic Metabolic Panel: Recent Labs  Lab 02/09/22 1900 02/10/22 0539  NA 140 138  K 3.9 4.2  CL 107 107  CO2 25 23  GLUCOSE 104* 109*  BUN 19 17  CREATININE 1.06* 0.83  CALCIUM 9.1 8.6*   GFR: Estimated Creatinine Clearance: 86 mL/min (by C-G formula based on SCr of 0.83 mg/dL). Liver Function Tests: Recent Labs  Lab 02/09/22 1900 02/10/22 0539  AST 18 25  ALT 22 20  ALKPHOS 85 70  BILITOT 0.6 0.8  PROT 7.8 6.9  ALBUMIN 4.0 3.6   No results for input(s): "LIPASE", "AMYLASE" in the last 168 hours. No results for input(s): "AMMONIA" in the last 168 hours. Coagulation Profile: No results for input(s): "INR", "PROTIME" in the last 168 hours. Cardiac Enzymes: No results for input(s): "CKTOTAL", "CKMB", "CKMBINDEX", "TROPONINI" in the last 168 hours. BNP (last 3 results) No results for input(s): "PROBNP" in the last 8760 hours. HbA1C: No results for input(s): "HGBA1C" in the last 72 hours. CBG: Recent Labs  Lab 02/09/22 2009 02/10/22 0831 02/10/22 1126  GLUCAP 118* 105* 219*   Lipid Profile: No results for input(s): "CHOL", "HDL", "LDLCALC", "TRIG", "CHOLHDL", "LDLDIRECT" in the last 72 hours. Thyroid Function Tests: No results for input(s): "TSH", "T4TOTAL", "FREET4", "T3FREE", "THYROIDAB" in the last 72 hours. Anemia Panel: No results for input(s): "VITAMINB12", "FOLATE", "FERRITIN", "TIBC", "IRON", "RETICCTPCT" in the last 72 hours. Sepsis Labs: Recent Labs  Lab 02/09/22 1900 02/09/22 1947  LATICACIDVEN 1.3 1.2    No results found for this or any previous visit (from the  past 240 hour(s)).       Radiology Studies: No results found.      Scheduled Meds:  aspirin EC  81 mg Oral Q breakfast   atorvastatin  80 mg Oral Daily   buPROPion  150 mg Oral BID   carvedilol  6.25 mg Oral BID WC   DULoxetine  60 mg Oral BID   enoxaparin (LOVENOX) injection  40 mg Subcutaneous Q24H   gabapentin  300 mg Oral TID   insulin aspart  0-15 Units Subcutaneous TID WC   levothyroxine  50 mcg Oral QAC breakfast   lisinopril  20 mg Oral Daily   loratadine  10 mg Oral Daily   mometasone-formoterol  2 puff Inhalation BID   spironolactone  12.5 mg Oral Daily   ticagrelor  90 mg Oral BID   Continuous Infusions:  cefTRIAXone (ROCEPHIN)  IV Stopped (02/10/22 0557)     LOS: 1 day    Time spent: 35 minutes    Gabriele Loveland D  Manuella Ghazi, DO [facility]  If 7PM-7AM, please contact night-coverage [email or web address] 02/10/2022, 1:26 PM

## 2022-02-10 NOTE — Plan of Care (Signed)
  Problem: Acute Rehab PT Goals(only PT should resolve) Goal: Pt Will Go Supine/Side To Sit Outcome: Progressing Flowsheets (Taken 02/10/2022 1159) Pt will go Supine/Side to Sit:  with minimal assist  with moderate assist Goal: Patient Will Transfer Sit To/From Stand Outcome: Progressing Flowsheets (Taken 02/10/2022 1159) Patient will transfer sit to/from stand:  with minimal assist  with moderate assist Goal: Pt Will Transfer Bed To Chair/Chair To Bed Outcome: Progressing Flowsheets (Taken 02/10/2022 1159) Pt will Transfer Bed to Chair/Chair to Bed:  with min assist  with mod assist Goal: Pt Will Ambulate Outcome: Progressing Flowsheets (Taken 02/10/2022 1159) Pt will Ambulate:  25 feet  with minimal assist  with moderate assist  with rolling walker   12:00 PM, 02/10/22 Ocie Bob, MPT Physical Therapist with [facility] [phone] office [phone] mobile phone

## 2022-02-10 NOTE — Progress Notes (Signed)
Patient placed on Contact Isolation per Infection control policy,patient and family educated, verbalized understanding ,educational carenotes provided.. Plan of care ongoing.

## 2022-02-10 NOTE — Evaluation (Signed)
Physical Therapy Evaluation Patient Details Name: Marie West MRN: [id number] DOB: [birth year] Today's Date: 02/10/2022  History of Present Illness  Marie West is a 66 y.o. female with medical history significant of COPD, diastolic CHF, type 2 diabetes mellitus, obesity, hypothyroidism who presents to the emergency department due to 2-3 day onset of generalized weakness which worsened today.  Patient was unable to provide detailed history about the weakness, some of the history was obtained from daughter at bedside and from ED physician and ED medical record.  Per report, daughter states that patient has been complaining of weakness since the last 2 days and also complained of increased confusion and disorientation.  She went to see her PCP this morning and she was given Rocephin and Bactrim was prescribed due to concern for recurrent cellulitis.  On arrival at home, patient felt so weak and was afraid of going up the 8 steps leading to apartment due to fear of falling, so she sat at the bottom of the stairs for about 3 hours.  Daughter tried to help patient to go up the stairs but was unable, so, EMS was eventually activated and on arrival of EMS team, she was noted to be febrile with a temperature of 1073F and she also had incontinence.  After patient was placed in a room with air conditioning, temperature improved to 98.73F.  She denies fever, chills, shortness of breath, nausea, vomiting or abdominal pain.  Patient was recently admitted from 8/10 to 8/14 due to acute exacerbation of diastolic CHF, UTI and bilateral lower extremity cellulitis which was treated with IV antibiotics and was discharged with Keflex till 8/20 to complete the antibiotic regimen.   Clinical Impression  Patient has difficulty moving BLE during sit/supine secondary to weakness and pain, very unsteady on feet with poor tolerance for weightbearing on RLE, requires repeated verbal/tactile for safety due to reaching for nearby  objects while using RW with frequent near loss of balance and limited to a few side steps at bedside before having to sit due to fatigue/generalized weakness. Patient tolerated sitting up in chair after therapy - nursing staff aware.  Patient will benefit from continued skilled physical therapy in hospital and recommended venue below to increase strength, balance, endurance for safe ADLs and gait.         Recommendations for follow up therapy are one component of a multi-disciplinary discharge planning process, led by the attending physician.  Recommendations may be updated based on patient status, additional functional criteria and insurance authorization.  Follow Up Recommendations Skilled nursing-short term rehab (<3 hours/day) Can patient physically be transported by private vehicle: No    Assistance Recommended at Discharge Set up Supervision/Assistance  Patient can return home with the following  A little help with bathing/dressing/bathroom;A lot of help with walking and/or transfers;Assistance with cooking/housework;Help with stairs or ramp for entrance    Equipment Recommendations None recommended by PT  Recommendations for Other Services       Functional Status Assessment Patient has had a recent decline in their functional status and demonstrates the ability to make significant improvements in function in a reasonable and predictable amount of time.     Precautions / Restrictions Precautions Precautions: Fall Restrictions Weight Bearing Restrictions: No      Mobility  Bed Mobility Overal bed mobility: Needs Assistance Bed Mobility: Supine to Sit, Sit to Supine     Supine to sit: Mod assist Sit to supine: Mod assist   General bed mobility comments: increased time,  labored movement    Transfers Overall transfer level: Needs assistance Equipment used: Rolling walker (2 wheels) Transfers: Sit to/from Stand, Bed to chair/wheelchair/BSC Sit to Stand: Mod assist    Step pivot transfers: Mod assist       General transfer comment: slow labored movement requiring repeated verbal/tactile cueing to keep hands on RW    Ambulation/Gait Ambulation/Gait assistance: Mod assist Gait Distance (Feet): 4 Feet Assistive device: Rolling walker (2 wheels) Gait Pattern/deviations: Decreased step length - right, Decreased step length - left, Decreased stride length Gait velocity: Decreased     General Gait Details: limited to a few slow labored side steps with poor tolerance for weightbearing on RLE due to increased pain and c/o weakness  Stairs            Wheelchair Mobility    Modified Rankin (Stroke Patients Only)       Balance Overall balance assessment: Needs assistance Sitting-balance support: Feet supported, No upper extremity supported Sitting balance-Leahy Scale: Fair Sitting balance - Comments: fair/good seated at EOB   Standing balance support: During functional activity, Bilateral upper extremity supported Standing balance-Leahy Scale: Poor Standing balance comment: fair/poor using RW                             Pertinent Vitals/Pain Pain Assessment Pain Assessment: Faces Faces Pain Scale: Hurts little more Pain Location: RLE Pain Descriptors / Indicators: Sore, Guarding, Grimacing Pain Intervention(s): Limited activity within patient's tolerance, Monitored during session, Repositioned    Home Living Family/patient expects to be discharged to:: Private residence Living Arrangements: Children Available Help at Discharge: Family;Available 24 hours/day Type of Home: House Home Access: Stairs to enter Entrance Stairs-Rails: Can reach both Entrance Stairs-Number of Steps: 7   Home Layout: Able to live on main level with bedroom/bathroom Home Equipment: Rollator (4 wheels) Additional Comments: Living with daughter currently, who works form home and is available 24/7    Prior Function Prior Level of Function :  Needs assist       Physical Assist : Mobility (physical);ADLs (physical) Mobility (physical): Bed mobility;Transfers;Stairs   Mobility Comments: assisted by daughter for getting into/out of bed, household ambulator using RW, uses electric carts in grocery stores ADLs Comments: assisted by family     Hand Dominance   Dominant Hand: Right    Extremity/Trunk Assessment   Upper Extremity Assessment Upper Extremity Assessment: Defer to OT evaluation    Lower Extremity Assessment Lower Extremity Assessment: Generalized weakness    Cervical / Trunk Assessment Cervical / Trunk Assessment: Normal  Communication   Communication: No difficulties  Cognition Arousal/Alertness: Awake/alert Behavior During Therapy: WFL for tasks assessed/performed Overall Cognitive Status: Within Functional Limits for tasks assessed                                          General Comments      Exercises     Assessment/Plan    PT Assessment Patient needs continued PT services  PT Problem List Decreased strength;Decreased activity tolerance;Decreased balance;Decreased mobility;Pain       PT Treatment Interventions Gait training;Stair training;Therapeutic activities;Patient/family education;Functional mobility training;Balance training;DME instruction;Therapeutic exercise    PT Goals (Current goals can be found in the Care Plan section)  Acute Rehab PT Goals Patient Stated Goal: Return home PT Goal Formulation: With patient Time For Goal Achievement: 02/24/22 Potential to  Achieve Goals: Good    Frequency Min 3X/week     Co-evaluation               AM-PAC PT "6 Clicks" Mobility  Outcome Measure Help needed turning from your back to your side while in a flat bed without using bedrails?: A Lot Help needed moving from lying on your back to sitting on the side of a flat bed without using bedrails?: A Lot Help needed moving to and from a bed to a chair (including a  wheelchair)?: A Lot Help needed standing up from a chair using your arms (e.g., wheelchair or bedside chair)?: A Lot Help needed to walk in hospital room?: A Lot Help needed climbing 3-5 steps with a railing? : Total 6 Click Score: 11    End of Session   Activity Tolerance: Patient tolerated treatment well;Patient limited by fatigue;Patient limited by pain Patient left: in chair;with call bell/phone within reach Nurse Communication: Mobility status PT Visit Diagnosis: Unsteadiness on feet (R26.81);Other abnormalities of gait and mobility (R26.89);Difficulty in walking, not elsewhere classified (R26.2);Muscle weakness (generalized) (M62.81)    Time: 6073-7106 PT Time Calculation (min) (ACUTE ONLY): 29 min   Charges:   PT Evaluation $PT Eval Moderate Complexity: 1 Mod PT Treatments $Therapeutic Activity: 23-37 mins        11:58 AM, 02/10/22 Ocie Bob, MPT Physical Therapist with [facility] [phone] office [phone] mobile phone

## 2022-02-10 NOTE — TOC Initial Note (Signed)
Transition of Care [facility]) - Initial/Assessment Note    Patient Details  Name: Marie West MRN: [id number] Date of Birth: [birth year]  Transition of Care [facility]) CM/SW Contact:    Elliot Gault, LCSW Phone Number: 02/10/2022, 2:02 PM  Clinical Narrative:                  Pt admitted from home. She was recently discharged home from [facility] with [facility] services in place. At this time, PT recommending SNF rehab at dc. Discussed with pt who is agreeable. CMS provider options reviewed. Will refer as requested. Pt will need insurance authorization. MD anticipating possible weekend dc.  TOC will follow.  Expected Discharge Plan: Skilled Nursing Facility Barriers to Discharge: Continued Medical Work up   Patient Goals and CMS Choice Patient states their goals for this hospitalization and ongoing recovery are:: get better   Choice offered to / list presented to : Patient  Expected Discharge Plan and Services Expected Discharge Plan: Skilled Nursing Facility In-house Referral: Clinical Social Work   Post Acute Care Choice: Skilled Nursing Facility Living arrangements for the past 2 months: Single Family Home                                      Prior Living Arrangements/Services Living arrangements for the past 2 months: Single Family Home Lives with:: Adult Children Patient language and need for interpreter reviewed:: Yes Do you feel safe going back to the place where you live?: Yes      Need for Family Participation in Patient Care: Yes (Comment) Care giver support system in place?: Yes (comment) Current home services: DME, Home PT, Home RN Criminal Activity/Legal Involvement Pertinent to Current Situation/Hospitalization: No - Comment as needed  Activities of Daily Living Home Assistive Devices/Equipment: CBG Meter, Bedside commode/3-in-1, Built-in shower seat, Walker (specify type) ADL Screening (condition at time of admission) Patient's cognitive ability adequate  to safely complete daily activities?: Yes Is the patient deaf or have difficulty hearing?: No Does the patient have difficulty seeing, even when wearing glasses/contacts?: No Does the patient have difficulty concentrating, remembering, or making decisions?: Yes Patient able to express need for assistance with ADLs?: Yes Does the patient have difficulty dressing or bathing?: Yes Independently performs ADLs?: No Communication: Independent Dressing (OT): Needs assistance Is this a change from baseline?: Pre-admission baseline Grooming: Independent Feeding: Independent Bathing: Needs assistance Is this a change from baseline?: Pre-admission baseline Toileting: Needs assistance Is this a change from baseline?: Pre-admission baseline In/Out Bed: Needs assistance Is this a change from baseline?: Pre-admission baseline Walks in Home: Needs assistance Is this a change from baseline?: Pre-admission baseline Does the patient have difficulty walking or climbing stairs?: Yes Weakness of Legs: Both Weakness of Arms/Hands: Both  Permission Sought/Granted Permission sought to share information with : Oceanographer granted to share information with : Yes, Verbal Permission Granted     Permission granted to share info w AGENCY: [facility]        Emotional Assessment   Attitude/Demeanor/Rapport: Engaged Affect (typically observed): Pleasant Orientation: : Oriented to Self, Oriented to Place, Oriented to  Time, Oriented to Situation Alcohol / Substance Use: Not Applicable Psych Involvement: No (comment)  Admission diagnosis:  Weakness [R53.1] Generalized weakness [R53.1] Cellulitis of lower extremity, unspecified laterality [L03.119] Patient Active Problem List   Diagnosis Date Noted   Heat exhaustion 02/10/2022   Dehydration 02/10/2022  Generalized weakness 02/09/2022   Cellulitis 01/27/2022   CAD (coronary artery disease) 01/27/2022   UTI (urinary tract  infection) 01/27/2022   Unstable angina pectoris (HCC) 04/05/2021   Pneumonia due to COVID-19 virus 07/30/2020   COPD exacerbation (HCC) 07/29/2020   Acute diastolic CHF (congestive heart failure) (HCC) 07/29/2020   COPD (chronic obstructive pulmonary disease) (HCC) 07/29/2020   Acute dyspnea 11/17/2015   Depression 11/17/2015   Essential hypertension 11/17/2015   Non-insulin dependent type 2 diabetes mellitus (HCC) 11/17/2015   Acquired hypothyroidism 11/17/2015   Acute exacerbation of chronic obstructive pulmonary disease (COPD) (HCC)    Hypoxia    CAP (community acquired pneumonia) 07/09/2013   Acute respiratory failure (HCC) 07/09/2013   Pleural effusion 07/09/2013   Hypertensive urgency 07/09/2013   Acute respiratory failure with hypoxia (HCC) 07/09/2013   COPD with exacerbation (HCC) 06/01/2012   Tobacco use 06/01/2012   Morbid obesity with BMI of 50.0-59.9, adult (HCC) 06/01/2012   Arthritis 06/01/2012   possible CHF (congestive heart failure) 06/01/2012   PCP:  Waldon Reining, MD Pharmacy:   [facility] [address] [place] - [address] [place] [address] Phone: [phone] Fax: [phone]     Social Determinants of Health (SDOH) Interventions    Readmission Risk Interventions     No data to display

## 2022-02-11 DIAGNOSIS — R531 Weakness: Secondary | ICD-10-CM | POA: Diagnosis not present

## 2022-02-11 LAB — CBC
HCT: 36.6 % (ref 36.0–46.0)
Hemoglobin: 11.5 g/dL — ABNORMAL LOW (ref 12.0–15.0)
MCH: 28.6 pg (ref 26.0–34.0)
MCHC: 31.4 g/dL (ref 30.0–36.0)
MCV: 91 fL (ref 80.0–100.0)
Platelets: 219 10*3/uL (ref 150–400)
RBC: 4.02 MIL/uL (ref 3.87–5.11)
RDW: 14.3 % (ref 11.5–15.5)
WBC: 7.3 10*3/uL (ref 4.0–10.5)
nRBC: 0 % (ref 0.0–0.2)

## 2022-02-11 LAB — GLUCOSE, CAPILLARY
Glucose-Capillary: 100 mg/dL — ABNORMAL HIGH (ref 70–99)
Glucose-Capillary: 202 mg/dL — ABNORMAL HIGH (ref 70–99)
Glucose-Capillary: 87 mg/dL (ref 70–99)
Glucose-Capillary: 82 mg/dL (ref 70–99)

## 2022-02-11 LAB — BASIC METABOLIC PANEL
Anion gap: 5 (ref 5–15)
BUN: 19 mg/dL (ref 8–23)
CO2: 25 mmol/L (ref 22–32)
Calcium: 8.5 mg/dL — ABNORMAL LOW (ref 8.9–10.3)
Chloride: 108 mmol/L (ref 98–111)
Creatinine, Ser: 0.7 mg/dL (ref 0.44–1.00)
GFR, Estimated: >60 mL/min (ref >60)
Glucose, Bld: 90 mg/dL (ref 70–99)
Potassium: 3.5 mmol/L (ref 3.5–5.1)
Sodium: 138 mmol/L (ref 135–145)

## 2022-02-11 LAB — MAGNESIUM: Magnesium: 1.8 mg/dL (ref 1.7–2.4)

## 2022-02-11 NOTE — Progress Notes (Signed)
PROGRESS NOTE    Marie West  OMV:[id number] DOB: 1955-11-20 DOA: 02/09/2022 PCP: Waldon Reining, MD   Brief Narrative:     Marie West is a 66 y.o. female with medical history significant of COPD, diastolic CHF, type 2 diabetes mellitus, obesity, hypothyroidism who presents to the emergency department due to 2-3 day onset of generalized weakness which worsened today.  Assessment & Plan:   Principal Problem:   Generalized weakness Active Problems:   COPD (chronic obstructive pulmonary disease) (HCC)   CAD (coronary artery disease)   Morbid obesity with BMI of 50.0-59.9, adult (HCC)   Essential hypertension   Depression   Acquired hypothyroidism   Cellulitis   Heat exhaustion   Dehydration  Assessment and Plan:   Generalized weakness possibly secondary to multifactorial Continue fall precaution and neurochecks PT recommending SNF which patient is agreeable to   Recurrent bilateral lower extremity cellulitis Patient was seen by PCP today and Rocephin was given in the ED, Bactrim was prescribed for continuous management of cellulitis Continue IV ceftriaxone   Heat exhaustion-resolved   Dehydration No longer dehydrated, discontinue IV fluid and start home spironolactone   History of diastolic CHF Echocardiogram done on 01/28/2022 showed LVEF of 55 to 60%.  No RWMA.  RV systolic function not well-visualized.  RV size not well-visualized.   CAD s/p stent placement Cardiac cath with placement of DES stent for first marginal lesion 80% stenosis (10/22) Patient denies any chest pain Continue aspirin, Brilinta, Coreg, statins   COPD (not in acute exacerbation) Continue DuoNebs as needed Continue Dulera   Type 2 diabetes mellitus Last A1c on 01/27/2022 was 6.0 Continue ISS and hypoglycemia protocol Metformin and Marcelline Deist will be temporarily held at this time   Essential hypertension Continue Coreg, lisinopril   Morbid obesity (BMI 57.48) Continue diet and  lifestyle modification   Depression Continue Cymbalta   Hypothyroidism Continue Synthroid     DVT prophylaxis:Lovenox Code Status: Full Family Communication: None at bedside Disposition Plan:  Status is: Inpatient Remains inpatient appropriate because: Need for IV antibiotics.     Consultants:  None   Procedures:  None   Antimicrobials:  Anti-infectives (From admission, onward)    Start     Dose/Rate Route Frequency Ordered Stop   02/10/22 1000  cefTRIAXone (ROCEPHIN) 2 g in sodium chloride 0.9 % 100 mL IVPB  Status:  Discontinued        2 g 200 mL/hr over 30 Minutes Intravenous Every 24 hours 02/10/22 0109 02/10/22 0451   02/10/22 0500  cefTRIAXone (ROCEPHIN) 2 g in sodium chloride 0.9 % 100 mL IVPB        2 g 200 mL/hr over 30 Minutes Intravenous Every 24 hours 02/10/22 0451 02/17/22 0459   02/10/22 0115  cefTRIAXone (ROCEPHIN) 2 g in sodium chloride 0.9 % 100 mL IVPB  Status:  Discontinued        2 g 200 mL/hr over 30 Minutes Intravenous Every 24 hours 02/10/22 0108 02/10/22 0109       Subjective: Patient seen and evaluated today with no new acute complaints or concerns. No acute concerns or events noted overnight.  Objective: Vitals:   02/10/22 1958 02/10/22 2128 02/11/22 0503 02/11/22 1459  BP:  (!) 151/80 (!) 144/67 (!) 106/54  Pulse: 65 70 73 72  Resp: 18 19 19 19   Temp:  98.6 F (37 C) 98.7 F (37.1 C) 98.3 F (36.8 C)  TempSrc:  Oral Oral Oral  SpO2: 95% 92% 90% 94%  Weight:      Height:        Intake/Output Summary (Last 24 hours) at 02/11/2022 1511 Last data filed at 02/11/2022 0900 Gross per 24 hour  Intake 600 ml  Output 400 ml  Net 200 ml   Filed Weights   02/09/22 1758 02/10/22 1127  Weight: (!) 138 kg 136.1 kg    Examination:  General exam: Appears calm and comfortable, obese Respiratory system: Clear to auscultation. Respiratory effort normal. Cardiovascular system: S1 & S2 heard, RRR.  Gastrointestinal system: Abdomen is  soft Central nervous system: Alert and awake Extremities: No edema Skin: No significant lesions noted Psychiatry: Flat affect.    Data Reviewed: I have personally reviewed following labs and imaging studies  CBC: Recent Labs  Lab 02/09/22 1900 02/10/22 0539 02/11/22 0426  WBC 9.3 7.8 7.3  NEUTROABS 7.0  --   --   HGB 12.7 11.8* 11.5*  HCT 40.7 37.5 36.6  MCV 90.6 89.9 91.0  PLT 261 232 219   Basic Metabolic Panel: Recent Labs  Lab 02/09/22 1900 02/10/22 0539 02/11/22 0426  NA 140 138 138  K 3.9 4.2 3.5  CL 107 107 108  CO2 25 23 25   GLUCOSE 104* 109* 90  BUN 19 17 19   CREATININE 1.06* 0.83 0.70  CALCIUM 9.1 8.6* 8.5*  MG  --   --  1.8   GFR: Estimated Creatinine Clearance: 89.2 mL/min (by C-G formula based on SCr of 0.7 mg/dL). Liver Function Tests: Recent Labs  Lab 02/09/22 1900 02/10/22 0539  AST 18 25  ALT 22 20  ALKPHOS 85 70  BILITOT 0.6 0.8  PROT 7.8 6.9  ALBUMIN 4.0 3.6   No results for input(s): "LIPASE", "AMYLASE" in the last 168 hours. No results for input(s): "AMMONIA" in the last 168 hours. Coagulation Profile: No results for input(s): "INR", "PROTIME" in the last 168 hours. Cardiac Enzymes: No results for input(s): "CKTOTAL", "CKMB", "CKMBINDEX", "TROPONINI" in the last 168 hours. BNP (last 3 results) No results for input(s): "PROBNP" in the last 8760 hours. HbA1C: No results for input(s): "HGBA1C" in the last 72 hours. CBG: Recent Labs  Lab 02/10/22 1126 02/10/22 1629 02/10/22 2226 02/11/22 0746 02/11/22 1201  GLUCAP 219* 87 116* 100* 202*   Lipid Profile: No results for input(s): "CHOL", "HDL", "LDLCALC", "TRIG", "CHOLHDL", "LDLDIRECT" in the last 72 hours. Thyroid Function Tests: No results for input(s): "TSH", "T4TOTAL", "FREET4", "T3FREE", "THYROIDAB" in the last 72 hours. Anemia Panel: No results for input(s): "VITAMINB12", "FOLATE", "FERRITIN", "TIBC", "IRON", "RETICCTPCT" in the last 72 hours. Sepsis Labs: Recent  Labs  Lab 02/09/22 1900 02/09/22 1947  LATICACIDVEN 1.3 1.2    No results found for this or any previous visit (from the past 240 hour(s)).       Radiology Studies: No results found.      Scheduled Meds:  aspirin EC  81 mg Oral Q breakfast   atorvastatin  80 mg Oral Daily   buPROPion  150 mg Oral BID   carvedilol  6.25 mg Oral BID WC   DULoxetine  60 mg Oral BID   enoxaparin (LOVENOX) injection  40 mg Subcutaneous Q24H   gabapentin  300 mg Oral TID   insulin aspart  0-15 Units Subcutaneous TID WC   levothyroxine  50 mcg Oral QAC breakfast   lisinopril  20 mg Oral Daily   loratadine  10 mg Oral Daily   mometasone-formoterol  2 puff Inhalation BID   nystatin   Topical TID  spironolactone  12.5 mg Oral Daily   ticagrelor  90 mg Oral BID   Continuous Infusions:  cefTRIAXone (ROCEPHIN)  IV 2 g (02/11/22 0452)     LOS: 2 days    Time spent: 35 minutes    Halah Whiteside Darleen Crocker, DO [facility]  If 7PM-7AM, please contact night-coverage [email or web address] 02/11/2022, 3:11 PM

## 2022-02-11 NOTE — TOC Progression Note (Signed)
Transition of Care [facility]) - Progression Note    Patient Details  Name: Marie West MRN: [id number] Date of Birth: 28-Mar-1956  [facility] Care [facility]) CM/SW Contact  Elliot Gault, LCSW Phone Number: 02/11/2022, 2:38 PM  Clinical Narrative:     TOC following. Per MD, pt medically stable for dc. Bed offers presented to patient. She selects [facility]. Insurance auth started this AM. Updated Melissa at the SNF. They can accept pt Saturday if auth comes through.  TOC will follow.  Expected Discharge Plan: Skilled Nursing Facility Barriers to Discharge: Continued Medical Work up  Expected Discharge Plan and Services Expected Discharge Plan: Skilled Nursing Facility In-house Referral: Clinical Social Work   Post Acute Care Choice: Skilled Nursing Facility Living arrangements for the past 2 months: Single Family Home                                       Social Determinants of Health (SDOH) Interventions    Readmission Risk Interventions     No data to display

## 2022-02-11 NOTE — Evaluation (Signed)
Occupational Therapy Evaluation Patient Details Name: Marie West MRN: [id number] DOB: 12-10-1955 Today's Date: 02/11/2022   History of Present Illness DARONDA GAUR is a 66 y.o. female with medical history significant of COPD, diastolic CHF, type 2 diabetes mellitus, obesity, hypothyroidism who presents to the emergency department due to 2-3 day onset of generalized weakness which worsened today.  Patient was unable to provide detailed history about the weakness, some of the history was obtained from daughter at bedside and from ED physician and ED medical record.  Per report, daughter states that patient has been complaining of weakness since the last 2 days and also complained of increased confusion and disorientation.  She went to see her PCP this morning and she was given Rocephin and Bactrim was prescribed due to concern for recurrent cellulitis.  On arrival at home, patient felt so weak and was afraid of going up the 8 steps leading to apartment due to fear of falling, so she sat at the bottom of the stairs for about 3 hours.  Daughter tried to help patient to go up the stairs but was unable, so, EMS was eventually activated and on arrival of EMS team, she was noted to be febrile with a temperature of 1094F and she also had incontinence.  After patient was placed in a room with air conditioning, temperature improved to 98.94F.  She denies fever, chills, shortness of breath, nausea, vomiting or abdominal pain.  Patient was recently admitted from 8/10 to 8/14 due to acute exacerbation of diastolic CHF, UTI and bilateral lower extremity cellulitis which was treated with IV antibiotics and was discharged with Keflex till 8/20 to complete the antibiotic regimen.   Clinical Impression   Pt. Agreeable to Ot evaluation. Pt received sitting up in chair. She reports that she lives at home with her daughters and son in law who are available to help as needed. Her daughter works from home and is available  to help throughout the day. She stated that she has a shower chair, reacher, rollator, RW, cane, and BSC at home. Completed sit to stand t/f with use of RW and moderate assist.. Pt demonstrated slow labored movement with increased time to complete all transfers and functional mobility. Pt reports at baseline she requires assist to complete dressing and bathing, she drives, and used scooter at grocery store. Pt completed toilet t/f with use of grab bars, RW and moderate assist. Pt would benefit from continued OT services to address deficits in ADL's, functional mobility, and decreased activity tolerance. Discharge recommendations are below.        Recommendations for follow up therapy are one component of a multi-disciplinary discharge planning process, led by the attending physician.  Recommendations may be updated based on patient status, additional functional criteria and insurance authorization.   Follow Up Recommendations  Skilled nursing-short term rehab (<3 hours/day)    Assistance Recommended at Discharge Frequent or constant Supervision/Assistance  Patient can return home with the following A lot of help with walking and/or transfers;A lot of help with bathing/dressing/bathroom    Functional Status Assessment  Patient has had a recent decline in their functional status and demonstrates the ability to make significant improvements in function in a reasonable and predictable amount of time.               Precautions / Restrictions Precautions Precautions: Fall Restrictions Weight Bearing Restrictions: No      Mobility Bed Mobility Overal bed mobility: Needs Assistance  General bed mobility comments: pt received in chair. Pt reports that she sleeps on couch because she is unable to get in and out of a regular flat bed    Transfers Overall transfer level: Needs assistance Equipment used: Rolling walker (2 wheels) Transfers: Sit to/from Stand, Bed to  chair/wheelchair/BSC Sit to Stand: Mod assist     Step pivot transfers: Mod assist     General transfer comment: slow labored movement requiring repeated verbal/tactile cueing to keep hands on RW      Balance Overall balance assessment: Needs assistance Sitting-balance support: Feet supported, No upper extremity supported       Standing balance support: During functional activity, Bilateral upper extremity supported Standing balance-Leahy Scale: Poor Standing balance comment: fair/poor using RW                           ADL either performed or assessed with clinical judgement   ADL Overall ADL's : Needs assistance/impaired Eating/Feeding: Set up   Grooming: Set up;Sitting   Upper Body Bathing: Sitting;Minimal assistance   Lower Body Bathing: Maximal assistance;Sitting/lateral leans   Upper Body Dressing : Sitting;Set up   Lower Body Dressing: Moderate assistance;Sitting/lateral leans   Toilet Transfer: Moderate assistance;Cueing for safety;Rolling walker (2 wheels);Grab bars   Toileting- Clothing Manipulation and Hygiene: Minimal assistance   Tub/ Shower Transfer: Moderate assistance;Shower seat;Rolling walker (2 wheels)   Functional mobility during ADLs: Moderate assistance;Rolling walker (2 wheels)       Vision Baseline Vision/History: 0 No visual deficits Ability to See in Adequate Light: 0 Adequate Patient Visual Report: No change from baseline Vision Assessment?: No apparent visual deficits                Pertinent Vitals/Pain Pain Assessment Pain Assessment: No/denies pain     Hand Dominance Right   Extremity/Trunk Assessment Upper Extremity Assessment Upper Extremity Assessment: Overall WFL for tasks assessed   Lower Extremity Assessment Lower Extremity Assessment: Defer to PT evaluation   Cervical / Trunk Assessment Cervical / Trunk Assessment: Normal   Communication Communication Communication: No difficulties   Cognition  Arousal/Alertness: Awake/alert Behavior During Therapy: WFL for tasks assessed/performed Overall Cognitive Status: Within Functional Limits for tasks assessed                                                        Home Living Family/patient expects to be discharged to:: Private residence Living Arrangements: Children Available Help at Discharge: Family;Available 24 hours/day Type of Home: House Home Access: Stairs to enter [employer] of Steps: 7 Entrance Stairs-Rails: Can reach both Home Layout: Able to live on main level with bedroom/bathroom     Bathroom Shower/Tub: Chief Strategy Officer: Standard Bathroom Accessibility: Yes How Accessible: Accessible via walker Home Equipment: Cane - single point;Rollator (4 wheels);Standard Walker;BSC/3in1;Shower seat   Additional Comments: Living with daughter currently, who works form home and is available 24/7      Prior Functioning/Environment Prior Level of Function : Needs assist       Physical Assist : Mobility (physical);ADLs (physical) Mobility (physical): Bed mobility;Transfers;Stairs ADLs (physical): Bathing;Dressing Mobility Comments: assisted by daughter for getting into/out of bed, household ambulator using RW, uses Social research officer, government in grocery stores ADLs Comments: assisted by family- daughter assist with dressing and bathing  as needed        OT Problem List: Decreased strength;Decreased activity tolerance;Impaired balance (sitting and/or standing)      OT Treatment/Interventions: Therapeutic exercise;Self-care/ADL training;Energy conservation;DME and/or AE instruction;Therapeutic activities;Patient/family education    OT Goals(Current goals can be found in the care plan section) Acute Rehab OT Goals Patient Stated Goal: return hpme OT Goal Formulation: With patient Time For Goal Achievement: 02/25/22 Potential to Achieve Goals: Fair ADL Goals Pt Will Perform Lower Body  Bathing: with min assist;sitting/lateral leans Pt Will Perform Upper Body Dressing: with modified independence;sitting Pt Will Transfer to Toilet: with min assist  OT Frequency: Min 1X/week                  AM-PAC OT "6 Clicks" Daily Activity     Outcome Measure Help from another person eating meals?: A Little Help from another person taking care of personal grooming?: A Little Help from another person toileting, which includes using toliet, bedpan, or urinal?: A Lot Help from another person bathing (including washing, rinsing, drying)?: A Lot Help from another person to put on and taking off regular upper body clothing?: A Little Help from another person to put on and taking off regular lower body clothing?: A Lot 6 Click Score: 15   End of Session Equipment Utilized During Treatment: Rolling walker (2 wheels)  Activity Tolerance: Patient tolerated treatment well Patient left: in chair  OT Visit Diagnosis: Muscle weakness (generalized) (M62.81)                Time: 0973-5329 OT Time Calculation (min): 23 min Charges:  OT General Charges $OT Visit: 1 Visit OT Evaluation $OT Eval Moderate Complexity: 1 Mod   Bevelyn Ngo, OTR/L 02/11/2022, 11:56 AM

## 2022-02-11 NOTE — Plan of Care (Signed)
  Problem: Acute Rehab OT Goals (only OT should resolve) Goal: Pt. Will Perform Lower Body Bathing Flowsheets (Taken 02/11/2022 1155) Pt Will Perform Lower Body Bathing:  with min assist  sitting/lateral leans Goal: Pt. Will Perform Upper Body Dressing Flowsheets (Taken 02/11/2022 1155) Pt Will Perform Upper Body Dressing:  with modified independence  sitting Goal: Pt. Will Transfer To Toilet Flowsheets (Taken 02/11/2022 1155) Pt Will Transfer to Toilet: with min assist  Lurena Joiner, OTR/L

## 2022-02-12 DIAGNOSIS — R531 Weakness: Secondary | ICD-10-CM | POA: Diagnosis not present

## 2022-02-12 LAB — GLUCOSE, CAPILLARY
Glucose-Capillary: 107 mg/dL — ABNORMAL HIGH (ref 70–99)
Glucose-Capillary: 133 mg/dL — ABNORMAL HIGH (ref 70–99)
Glucose-Capillary: 174 mg/dL — ABNORMAL HIGH (ref 70–99)

## 2022-02-12 MED ORDER — OXYCODONE-ACETAMINOPHEN 5-325 MG PO TABS: 2.0000 | ORAL_TABLET | Freq: Four times a day (QID) | ORAL | 0 refills | Status: AC | PRN

## 2022-02-12 NOTE — TOC Transition Note (Addendum)
Transition of Care [facility]) - CM/SW Discharge Note   Patient Details  Name: Marie West MRN: [id number] Date of Birth: 05/30/1956  Transition of Care [facility]) CM/SW Contact:  Elliot Gault, LCSW Phone Number: 02/12/2022, 2:35 PM   Clinical Narrative:     Pt stable for dc today per MD. Pt has insurance auth for SNF. She remains agreeable to [facility] for rehab. Updated Melissa at [facility].  EMS arranged for transport at pt request.   DC clinical sent electronically. RN to call report.  No other TOC needs for dc.  TOC offered [facility] wheelchair transport and pt/family/RN stated that they did not feel w/c Zena[facility] Niece transport would be an appropriate plan.  Final next level of care: Skilled Nursing Facility Barriers to Discharge: Barriers Resolved   Patient Goals and CMS Choice Patient states their goals for this hospitalization and ongoing recovery are:: get better   Choice offered to / list presented to : Patient  Discharge Placement              Patient chooses bed at: [facility] Patient to be transferred to facility by: EMS Name of family member notified: Jodie Patient and family notified of of transfer: 02/12/22  Discharge Plan and Services In-house Referral: Clinical Social Work   Post Acute Care Choice: Skilled Nursing Facility                               Social Determinants of Health (SDOH) Interventions     Readmission Risk Interventions     No data to display

## 2022-02-12 NOTE — Progress Notes (Signed)
Patient discharged  to [facility],report called and given to Knute Neu LPN. Transported by [facility] to awaiting facility. Vital signs stable,IV discontinued, catheter intact.

## 2022-02-12 NOTE — Discharge Summary (Signed)
Physician Discharge Summary  Marie West [id number] DOB: [birth year] DOA: 02/09/2022  PCP: Waldon Reining, MD  Admit date: 02/09/2022  Discharge date: 02/12/2022  Admitted From:Home  Disposition:  SNF  Recommendations for Outpatient Follow-up:  Follow up with PCP in 1-2 weeks No further need for antibiotics as it is unclear that patient actually had cellulitis on admission Continue home medications as prior  Home Health: None  Equipment/Devices: None none  Discharge Condition:Stable  CODE STATUS: Full  Diet recommendation: Heart Healthy/carb modified  Brief/Interim Summary: Marie West is a 66 y.o. female with medical history significant of COPD, diastolic CHF, type 2 diabetes mellitus, obesity, hypothyroidism who presents to the emergency department due to 2-3 day onset of generalized weakness which had worsened on the date of admission.  Much of this appears multifactorial and there was some concern of initial dehydration as well as general debility given recent hospitalizations.  She was thought to have some initial mild bilateral lower extremity cellulitis and was started on IV Rocephin but this has now been discontinued as she has completed the appropriate course of treatment for this.  No further antibiotics are needed.  She has been seen by PT with recommendations for SNF and she is stable for discharge today.  Discharge Diagnoses:  Principal Problem:   Generalized weakness Active Problems:   COPD (chronic obstructive pulmonary disease) (HCC)   CAD (coronary artery disease)   Morbid obesity with BMI of 50.0-59.9, adult (HCC)   Essential hypertension   Depression   Acquired hypothyroidism   Cellulitis   Heat exhaustion   Dehydration  Principal discharge diagnosis: Generalized weakness-multifactorial with recent cellulitis and recurrent hospitalizations.  Discharge Instructions  Discharge Instructions     Diet - low sodium heart healthy   Complete by:  As directed    Increase activity slowly   Complete by: As directed       Allergies as of 02/12/2022       Reactions   Bee Venom Swelling        Medication List     STOP taking these medications    oxyCODONE-acetaminophen 10-325 MG tablet Commonly known as: PERCOCET Replaced by: oxyCODONE-acetaminophen 5-325 MG tablet       TAKE these medications    acetaminophen 325 MG tablet Commonly known as: TYLENOL Take 2 tablets (650 mg total) by mouth every 6 (six) hours as needed for mild pain, fever or headache (or Fever >/= 101).   albuterol 108 (90 Base) MCG/ACT inhaler Commonly known as: VENTOLIN HFA Inhale 2 puffs into the lungs every 6 (six) hours as needed for wheezing or shortness of breath.   amitriptyline 25 MG tablet Commonly known as: ELAVIL Take 25 mg by mouth at bedtime as needed for sleep.   aspirin EC 81 MG tablet Take 1 tablet (81 mg total) by mouth daily with breakfast.   atorvastatin 80 MG tablet Commonly known as: LIPITOR Take 1 tablet (80 mg total) by mouth daily.   budesonide-formoterol 160-4.5 MCG/ACT inhaler Commonly known as: SYMBICORT Inhale 2 puffs into the lungs 2 (two) times daily.   buPROPion 150 MG 12 hr tablet Commonly known as: ZYBAN Take 150 mg by mouth daily.   carvedilol 6.25 MG tablet Commonly known as: COREG Take 1 tablet (6.25 mg total) by mouth 2 (two) times daily with a meal.   cetirizine 10 MG tablet Commonly known as: ZYRTEC Take 10 mg by mouth daily.   cholecalciferol 25 MCG (1000 UNIT) tablet Commonly known as: VITAMIN  D3 Take 1,000 Units by mouth daily.   cyanocobalamin 1000 MCG tablet Commonly known as: VITAMIN B12 Take 1,000 mcg by mouth daily.   dapagliflozin propanediol 10 MG Tabs tablet Commonly known as: FARXIGA Take 1 tablet (10 mg total) by mouth daily.   DULoxetine 60 MG capsule Commonly known as: CYMBALTA Take 60 mg by mouth 2 (two) times daily.   Euthyrox 50 MCG tablet Generic drug:  levothyroxine Take 50 mcg by mouth daily before breakfast.   gabapentin 300 MG capsule Commonly known as: NEURONTIN Take 300 mg by mouth 3 (three) times daily.   ibuprofen 200 MG tablet Commonly known as: ADVIL Take 800 mg by mouth every 6 (six) hours as needed for moderate pain.   lisinopril 20 MG tablet Commonly known as: ZESTRIL Take 20 mg by mouth daily.   loratadine 10 MG tablet Commonly known as: CLARITIN Take 10 mg by mouth daily.   Melatonin 10 MG Caps Take 10 mg by mouth at bedtime.   metFORMIN 500 MG tablet Commonly known as: GLUCOPHAGE Take 1 tablet (500 mg total) by mouth daily with breakfast.   oxyCODONE-acetaminophen 5-325 MG tablet Commonly known as: PERCOCET/ROXICET Take 2 tablets by mouth 4 (four) times daily as needed for severe pain. Replaces: oxyCODONE-acetaminophen 10-325 MG tablet   spironolactone 25 MG tablet Commonly known as: ALDACTONE Take 1/2 tablet (12.5 mg total) by mouth daily.   ticagrelor 90 MG Tabs tablet Commonly known as: BRILINTA Take 1 tablet (90 mg total) by mouth 2 (two) times daily.   Zetia 10 MG tablet Generic drug: ezetimibe Take 1 tablet by mouth daily.        Contact information for follow-up providers     Leonie Douglas, MD. Schedule an appointment as soon as possible for a visit.   Specialties: Family Medicine, Sports Medicine Contact information: [address] [place] [address] [phone]         Arnoldo Lenis, MD .   Specialty: Cardiology Contact information: [place] [address] [phone]              Contact information for after-discharge care     Destination     [facility] .   Service: Skilled Nursing Contact information: [place] [address] [phone]                    Allergies  Allergen Reactions   Bee Venom Swelling     Consultations: None   Procedures/Studies: ECHOCARDIOGRAM LIMITED  Result Date: 01/28/2022    ECHOCARDIOGRAM LIMITED REPORT   Patient Name:   Marie West Date of Exam: 01/28/2022 Medical Rec #:  [id number]       Height:       61.0 in Accession #:    [id number]      Weight:       307.8 lb Date of Birth:  08/29/1955      BSA:          2.268 m Patient Age:    38 years        BP:           121/56 mmHg Patient Gender: F               HR:           79 bpm. Exam Location:  [facility] Procedure: Limited Echo and Intracardiac Opacification Agent Indications:    CHF  History:  Patient has prior history of Echocardiogram examinations, most                 recent 04/06/2021. CHF, CAD, COPD, Signs/Symptoms:Chest Pain and                 Dyspnea; Risk Factors:Hypertension, Diabetes and Current Smoker.  Sonographer:    Wenda Low Referring Phys: [id number] Leanne Chang [facility]  Sonographer Comments: Technically difficult study due to poor echo windows and patient is obese. IMPRESSIONS  1. Left ventricular ejection fraction, by estimation, is 55 to 60%. The left ventricle has normal function. The left ventricle has no regional wall motion abnormalities.  2. Right ventricular systolic function was not well visualized. The right ventricular size is not well visualized.  3. The inferior vena cava is normal in size with greater than 50% respiratory variability, suggesting right atrial pressure of 3 mmHg.  4. Limited echo to evaluate LV function FINDINGS  Left Ventricle: Left ventricular ejection fraction, by estimation, is 55 to 60%. The left ventricle has normal function. The left ventricle has no regional wall motion abnormalities. Definity contrast agent was given IV to delineate the left ventricular  endocardial borders. Right Ventricle: The right ventricular size is not well visualized. Right vetricular wall thickness was not well visualized. Right ventricular systolic function was not well visualized.  Pericardium: There is no evidence of pericardial effusion. Venous: The inferior vena cava is normal in size with greater than 50% respiratory variability, suggesting right atrial pressure of 3 mmHg. LEFT VENTRICLE PLAX 2D LVIDd:         4.50 cm LVIDs:         3.20 cm LV PW:         1.30 cm LV IVS:        1.30 cm LVOT diam:     2.10 cm LVOT Area:     3.46 cm  LEFT ATRIUM         Index LA diam:    4.10 cm 1.81 cm/m   AORTA Ao Root diam: 3.60 cm  SHUNTS Systemic Diam: 2.10 cm Carlyle Dolly MD Electronically signed by Carlyle Dolly MD Signature Date/Time: 01/28/2022/2:27:23 PM    Final    CT Angio Chest PE W and/or Wo Contrast  Result Date: 01/27/2022 CLINICAL DATA:  Pulmonary embolism (PE) suspected, high prob. Dyspnea, leg swelling EXAM: CT ANGIOGRAPHY CHEST WITH CONTRAST TECHNIQUE: Multidetector CT imaging of the chest was performed using the standard protocol during bolus administration of intravenous contrast. Multiplanar CT image reconstructions and MIPs were obtained to evaluate the vascular anatomy. RADIATION DOSE REDUCTION: This exam was performed according to the departmental dose-optimization program which includes automated exposure control, adjustment of the mA and/or kV according to patient size and/or use of iterative reconstruction technique. CONTRAST:  165mL OMNIPAQUE IOHEXOL 350 MG/ML SOLN COMPARISON:  None Available. FINDINGS: Cardiovascular: There is relatively poor opacification of pulmonary arterial tree due to bolus timing and the examination is adequate only for exclusion of large pulmonary emboli within the main and central right left pulmonary arteries. Lobar and peripheral pulmonary arteries are not adequately opacified to definitively exclude the presence of small pulmonary emboli. The central pulmonary arteries are of normal caliber. Extensive multi-vessel coronary artery calcification. Global cardiac size is mildly enlarged. No pericardial effusion. Mild atherosclerotic  calcification within the thoracic aorta. No aortic aneurysm. Mediastinum/Nodes: No enlarged mediastinal, hilar, or axillary lymph nodes. Thyroid gland, trachea, and esophagus demonstrate no significant findings. Lungs/Pleura: 6 mm noncalcified pulmonary nodule within  the right lower lobe, axial image # 74/7 and 5 mm noncalcified pulmonary nodule, left upper lobe, axial image # 47/7 are no focal pulmonary infiltrates. No pneumothorax or pleural effusion. Central airways are widely patent. Stable since prior examination and safely considered benign. Multiple pulmonary nodules within the basilar left lower lobe measuring up to 7 mm are new from prior examination and are technically indeterminate. Upper Abdomen: No acute abnormality. Musculoskeletal: Multiple healed right rib fractures are seen. Advanced degenerative changes are seen within the shoulders bilaterally. Degenerative changes are seen within the thoracic spine. No acute bone abnormality. No lytic or blastic bone lesion. Review of the MIP images confirms the above findings. IMPRESSION: 1. Technically limited examination without evidence of large pulmonary emboli within the main and central right and left pulmonary arteries. Lobar and peripheral pulmonary arteries are not adequately opacified to definitively exclude the presence of small pulmonary emboli. 2. Extensive multi-vessel coronary artery calcification. Mild global cardiomegaly. 3. Multiple pulmonary nodules within the basilar left lower lobe measuring up to 7 mm, new from prior examination and technically indeterminate. Non-contrast chest CT at 3-6 months is recommended. If the nodules are stable at time of repeat CT, then future CT at 18-24 months (from today's scan) is considered optional for low-risk patients, but is recommended for high-risk patients. This recommendation follows the consensus statement: Guidelines for Management of Incidental Pulmonary Nodules Detected on CT Images: From the  Fleischner Society 2017; Radiology 2017; 284:228-243. Aortic Atherosclerosis (ICD10-I70.0). Electronically Signed   By: Helyn Numbers M.D.   On: 01/27/2022 19:43   US Venous Img Lower Bilateral  Result Date: 01/27/2022 CLINICAL DATA:  Shortness of breath with cough, bilateral lower extremity swelling. EXAM: BILATERAL LOWER EXTREMITY VENOUS DOPPLER ULTRASOUND TECHNIQUE: Gray-scale sonography with graded compression, as well as color Doppler and duplex ultrasound were performed to evaluate the lower extremity deep venous systems from the level of the common femoral vein and including the common femoral, femoral, profunda femoral, popliteal and calf veins including the posterior tibial, peroneal and gastrocnemius veins when visible. The superficial great saphenous vein was also interrogated. Spectral Doppler was utilized to evaluate flow at rest and with distal augmentation maneuvers in the common femoral, femoral and popliteal veins. COMPARISON:  None Available. FINDINGS: RIGHT LOWER EXTREMITY Common Femoral Vein: No evidence of thrombus. Normal compressibility, respiratory phasicity and response to augmentation. Saphenofemoral Junction: No evidence of thrombus. Normal compressibility and flow on color Doppler imaging. Profunda Femoral Vein: No evidence of thrombus. Normal compressibility and flow on color Doppler imaging. Femoral Vein: No evidence of thrombus. Normal compressibility, respiratory phasicity and response to augmentation. Popliteal Vein: No evidence of thrombus. Normal compressibility, respiratory phasicity and response to augmentation. Calf Veins: No evidence of thrombus. Normal compressibility and flow on color Doppler imaging. Superficial Great Saphenous Vein: No evidence of thrombus. Normal compressibility. Venous Reflux:  None. Other Findings:  None. LEFT LOWER EXTREMITY Common Femoral Vein: No evidence of thrombus. Normal compressibility, respiratory phasicity and response to augmentation.  Saphenofemoral Junction: No evidence of thrombus. Normal compressibility and flow on color Doppler imaging. Profunda Femoral Vein: No evidence of thrombus. Normal compressibility and flow on color Doppler imaging. Femoral Vein: No evidence of thrombus. Normal compressibility, respiratory phasicity and response to augmentation. Popliteal Vein: No evidence of thrombus. Normal compressibility, respiratory phasicity and response to augmentation. Calf Veins: No evidence of thrombus. Normal compressibility and flow on color Doppler imaging. Superficial Great Saphenous Vein: No evidence of thrombus. Normal compressibility. Venous Reflux:  None. Other Findings:  None. IMPRESSION: No evidence of deep venous thrombosis in either lower extremity. Electronically Signed   By: Zerita Boers M.D.   On: 01/27/2022 17:39   DG Chest Portable 1 View  Result Date: 01/27/2022 CLINICAL DATA:  Productive cough.  Shortness of breath. EXAM: PORTABLE CHEST 1 VIEW COMPARISON:  One-view chest x-ray 07/30/2020 FINDINGS: Heart is enlarged, exaggerated by low lung volumes. Mild edema is present. No focal airspace consolidation is present. Advanced degenerative changes are noted in both shoulders. IMPRESSION: Cardiomegaly and mild edema compatible with congestive heart failure. Electronically Signed   By: San Morelle M.D.   On: 01/27/2022 15:52     Discharge Exam: Vitals:   02/12/22 0948 02/12/22 1228  BP:  120/80  Pulse:  70  Resp:  17  Temp:  98.2 F (36.8 C)  SpO2: 96% 92%   Vitals:   02/11/22 2103 02/12/22 0503 02/12/22 0948 02/12/22 1228  BP: (!) 120/51 125/85  120/80  Pulse: 68 72  70  Resp: 19 20  17   Temp: 98.6 F (37 C) 97.8 F (36.6 C)  98.2 F (36.8 C)  TempSrc: Oral Oral    SpO2: 95% 94% 96% 92%  Weight:      Height:        General: Pt is alert, awake, not in acute distress, obese Cardiovascular: RRR, S1/S2 +, no rubs, no gallops Respiratory: CTA bilaterally, no wheezing, no  rhonchi Abdominal: Soft, NT, ND, bowel sounds + Extremities: Mild, chronic edema with no erythema    The results of significant diagnostics from this hospitalization (including imaging, microbiology, ancillary and laboratory) are listed below for reference.     Microbiology: No results found for this or any previous visit (from the past 240 hour(s)).   Labs: BNP (last 3 results) Recent Labs    01/27/22 1551  BNP [id number]   Basic Metabolic Panel: Recent Labs  Lab 02/09/22 1900 02/10/22 0539 02/11/22 0426  NA 140 138 138  K 3.9 4.2 3.5  CL 107 107 108  CO2 25 23 25   GLUCOSE 104* 109* 90  BUN 19 17 19   CREATININE 0000000* 0.83 0.70  CALCIUM 9.1 8.6* 8.5*  MG  --   --  1.8   Liver Function Tests: Recent Labs  Lab 02/09/22 1900 02/10/22 0539  AST 18 25  ALT 22 20  ALKPHOS 85 70  BILITOT 0.6 0.8  PROT 7.8 6.9  ALBUMIN 4.0 3.6   No results for input(s): "LIPASE", "AMYLASE" in the last 168 hours. No results for input(s): "AMMONIA" in the last 168 hours. CBC: Recent Labs  Lab 02/09/22 1900 02/10/22 0539 02/11/22 0426  WBC 9.3 7.8 7.3  NEUTROABS 7.0  --   --   HGB 12.7 11.8* 11.5*  HCT 40.7 37.5 36.6  MCV 90.6 89.9 91.0  PLT 261 232 219   Cardiac Enzymes: No results for input(s): "CKTOTAL", "CKMB", "CKMBINDEX", "TROPONINI" in the last 168 hours. BNP: Invalid input(s): "POCBNP" CBG: Recent Labs  Lab 02/11/22 1201 02/11/22 1715 02/11/22 2104 02/12/22 0716 02/12/22 1102  GLUCAP 202* 87 82 107* 133*   D-Dimer No results for input(s): "DDIMER" in the last 72 hours. Hgb A1c No results for input(s): "HGBA1C" in the last 72 hours. Lipid Profile No results for input(s): "CHOL", "HDL", "LDLCALC", "TRIG", "CHOLHDL", "LDLDIRECT" in the last 72 hours. Thyroid function studies No results for input(s): "TSH", "T4TOTAL", "T3FREE", "THYROIDAB" in the last 72 hours.  Invalid input(s): "FREET3" Anemia work up No results for input(s): "VITAMINB12", "FOLATE",  "  FERRITIN", "TIBC", "IRON", "RETICCTPCT" in the last 72 hours. Urinalysis    Component Value Date/Time   COLORURINE YELLOW 02/10/2022 0610   APPEARANCEUR CLEAR 02/10/2022 0610   LABSPEC 1.019 02/10/2022 0610   PHURINE 5.0 02/10/2022 0610   GLUCOSEU 50 (A) 02/10/2022 0610   HGBUR NEGATIVE 02/10/2022 0610   BILIRUBINUR NEGATIVE 02/10/2022 0610   KETONESUR 5 (A) 02/10/2022 0610   PROTEINUR NEGATIVE 02/10/2022 0610   UROBILINOGEN 0.2 06/01/2012 1919   NITRITE NEGATIVE 02/10/2022 0610   LEUKOCYTESUR NEGATIVE 02/10/2022 0610   Sepsis Labs Recent Labs  Lab 02/09/22 1900 02/10/22 0539 02/11/22 0426  WBC 9.3 7.8 7.3   Microbiology No results found for this or any previous visit (from the past 240 hour(s)).   Time coordinating discharge: 35 minutes  SIGNED:   Rodena Goldmann, DO [facility] 02/12/2022, 12:41 PM  If 7PM-7AM, please contact night-coverage [email or web address]

## 2022-02-12 NOTE — Plan of Care (Signed)

## 2022-03-13 NOTE — Progress Notes (Deleted)
Cardiology Clinic Note   Patient Name: Marie West Date of Encounter: 03/13/2022  Primary Care Provider:  Leonie Douglas, MD Primary Cardiologist:  Carlyle Dolly, MD  Patient Profile    Marie West 66 year old female presents to the clinic today for evaluation of her hypertension and acute diastolic CHF.  Past Medical History    Past Medical History:  Diagnosis Date   Acute respiratory failure (HCC)    Arthritis    Bronchitis    CHF (congestive heart failure) ([place])    COPD with exacerbation (HCC)    Fibromyalgia    Headache(784.0)    Hypertension    Hypertensive urgency    Hyperthyroidism    MI (myocardial infarction) ([place])    MRSA infection    Pneumonia    Retinal detachment    Sciatic leg pain    Shortness of breath    Past Surgical History:  Procedure Laterality Date   CORONARY STENT INTERVENTION N/A 04/05/2021   Procedure: CORONARY STENT INTERVENTION;  Surgeon: Early Osmond, MD;  Location: [facility];  Service: Cardiovascular;  Laterality: N/A;   KNEE SURGERY     LEFT HEART CATH AND CORONARY ANGIOGRAPHY N/A 04/05/2021   Procedure: LEFT HEART CATH AND CORONARY ANGIOGRAPHY;  Surgeon: Early Osmond, MD;  Location: [facility];  Service: Cardiovascular;  Laterality: N/A;   TUBAL LIGATION      Allergies  Allergies  Allergen Reactions   Bee Venom Swelling    History of Present Illness    Marie West is a PMH of acute diastolic CHF, unstable angina, CAD, essential hypertension, COPD, pleural effusion, acute exacerbation of chronic COPD, hypoxia, type 2 diabetes, UTI, tobacco abuse, depression, cellulitis, dehydration, general weakness, and heat exhaustion.  She was seen in follow-up by Katina Dung, NP on 04/19/2021.  She had presented to the hospital on 04/05/2021 with chest pain.  Her EKG showed ST changes in leads I and aVL and T wave inversion was noted in lead III.  She was given 324 mg of aspirin and taken emergently  to the cardiac Cath Lab.  She was noted to have high-grade lesion of her proximal OM which was treated with PCI and DES x1.  She was started on DAPT aspirin/Brilinta.  Her high-sensitivity troponin peaked at 13,000.  Her echocardiogram showed an EF of 40-45% with global hypokinesis and G1 DD.  Her LVEDP was 35 by catheterization.  She received IV furosemide and was net -4.6 L.  She was started on spironolactone 12.5 mg daily along with furosemide, Farxiga, a atorvastatin.  Tobacco cessation was advised.  Her hemoglobin A1c at discharge was 6%.  On follow-up she was tolerating her medication regimen well.  She reported that she was feeling much better.  Recommendation for cardiac MRI was discussed.  Her blood pressure was well controlled.  Her breathing had improved.  She reported that she had slipped and taken a couple plus of 1 cigarette.  She did report smoking cessation however.  She was walking with a walker.  She was admitted to the hospital on 02/09/2022 for generalized weakness.  She was discharged on 02/12/2022.  She was discharged to skilled nursing.  She presents to the clinic today for follow-up evaluation and states***  *** denies chest pain, shortness of breath, lower extremity edema, fatigue, palpitations, melena, hematuria, hemoptysis, diaphoresis, weakness, presyncope, syncope, orthopnea, and PND.    Home Medications    Prior to Admission medications   Medication Sig  Start Date End Date Taking? Authorizing Provider  acetaminophen (TYLENOL) 325 MG tablet Take 2 tablets (650 mg total) by mouth every 6 (six) hours as needed for mild pain, fever or headache (or Fever >/= 101). 08/01/20   Shon Hale, MD  albuterol (VENTOLIN HFA) 108 (90 Base) MCG/ACT inhaler Inhale 2 puffs into the lungs every 6 (six) hours as needed for wheezing or shortness of breath. 08/01/20   Shon Hale, MD  amitriptyline (ELAVIL) 25 MG tablet Take 25 mg by mouth at bedtime as needed for sleep. 08/24/20    [provider]  aspirin EC 81 MG tablet Take 1 tablet (81 mg total) by mouth daily with breakfast. 08/01/20   Shon Hale, MD  atorvastatin (LIPITOR) 80 MG tablet Take 1 tablet (80 mg total) by mouth daily. 08/09/21   Antoine Poche, MD  budesonide-formoterol (SYMBICORT) 160-4.5 MCG/ACT inhaler Inhale 2 puffs into the lungs 2 (two) times daily. 08/01/20   Shon Hale, MD  buPROPion (ZYBAN) 150 MG 12 hr tablet Take 150 mg by mouth daily. 04/01/21   [provider]  carvedilol (COREG) 6.25 MG tablet Take 1 tablet (6.25 mg total) by mouth 2 (two) times daily with a meal. 08/09/21   Branch, Dorothe Pea, MD  cetirizine (ZYRTEC) 10 MG tablet Take 10 mg by mouth daily.    [provider]  cholecalciferol (VITAMIN D3) 25 MCG (1000 UNIT) tablet Take 1,000 Units by mouth daily.    [provider]  dapagliflozin propanediol (FARXIGA) 10 MG TABS tablet Take 1 tablet (10 mg total) by mouth daily. 08/09/21   Antoine Poche, MD  DULoxetine (CYMBALTA) 60 MG capsule Take 60 mg by mouth 2 (two) times daily.    [provider]  ezetimibe (ZETIA) 10 MG tablet Take 1 tablet by mouth daily.    [provider]  gabapentin (NEURONTIN) 300 MG capsule Take 300 mg by mouth 3 (three) times daily. 03/23/21   [provider]  ibuprofen (ADVIL) 200 MG tablet Take 800 mg by mouth every 6 (six) hours as needed for moderate pain.    [provider]  levothyroxine (EUTHYROX) 50 MCG tablet Take 50 mcg by mouth daily before breakfast.    [provider]  lisinopril (ZESTRIL) 20 MG tablet Take 20 mg by mouth daily. 12/12/21   [provider]  loratadine (CLARITIN) 10 MG tablet Take 10 mg by mouth daily.    [provider]  Melatonin 10 MG CAPS Take 10 mg by mouth at bedtime.    [provider]  metFORMIN (GLUCOPHAGE) 500 MG tablet Take 1 tablet (500 mg total) by mouth daily with breakfast. 08/01/20   Shon Hale, MD   oxyCODONE-acetaminophen (PERCOCET/ROXICET) 5-325 MG tablet Take 2 tablets by mouth 4 (four) times daily as needed for severe pain. 02/12/22   Sherryll Burger, Pratik D, DO  spironolactone (ALDACTONE) 25 MG tablet Take 1/2 tablet (12.5 mg total) by mouth daily. 08/09/21   Antoine Poche, MD  ticagrelor (BRILINTA) 90 MG TABS tablet Take 1 tablet (90 mg total) by mouth 2 (two) times daily. 08/09/21   Antoine Poche, MD  vitamin B-12 (CYANOCOBALAMIN) 1000 MCG tablet Take 1,000 mcg by mouth daily.    [provider]    Family History    Family History  Problem Relation Age of Onset   Diabetes Mother    Heart disease Mother        died AMI, age 82   Heart disease Brother  Diabetes Sister    She indicated that her mother is deceased. She indicated that her father is alive. She indicated that two of her three sisters are alive. She indicated that two of her three brothers are alive.  Social History    Social History   Socioeconomic History   Marital status: Divorced    Spouse name: Not on file   Number of children: Not on file   Years of education: Not on file   Highest education level: Not on file  Occupational History   Not on file  Tobacco Use   Smoking status: Some Days    Packs/day: 0.50    Years: 36.00    Total pack years: 18.00    Types: Cigarettes    Start date: 06/04/1971   Smokeless tobacco: Never  Substance and Sexual Activity   Alcohol use: Yes    Comment: occ-wine   Drug use: No   Sexual activity: Yes    Birth control/protection: Surgical  Other Topics Concern   Not on file  Social History Narrative   Not on file   Social Determinants of Health   Financial Resource Strain: Not on file  Food Insecurity: Not on file  Transportation Needs: Not on file  Physical Activity: Not on file  Stress: Not on file  Social Connections: Not on file  Intimate Partner Violence: Not on file     Review of Systems    General:  No chills, fever, night sweats or  weight changes.  Cardiovascular:  No chest pain, dyspnea on exertion, edema, orthopnea, palpitations, paroxysmal nocturnal dyspnea. Dermatological: No rash, lesions/masses Respiratory: No cough, dyspnea Urologic: No hematuria, dysuria Abdominal:   No nausea, vomiting, diarrhea, bright red blood per rectum, melena, or hematemesis Neurologic:  No visual changes, wkns, changes in mental status. All other systems reviewed and are otherwise negative except as noted above.  Physical Exam    VS:  There were no vitals taken for this visit. , BMI There is no height or weight on file to calculate BMI. GEN: Well nourished, well developed, in no acute distress. HEENT: normal. Neck: Supple, no JVD, carotid bruits, or masses. Cardiac: RRR, no murmurs, rubs, or gallops. No clubbing, cyanosis, edema.  Radials/DP/PT 2+ and equal bilaterally.  Respiratory:  Respirations regular and unlabored, clear to auscultation bilaterally. GI: Soft, nontender, nondistended, BS + x 4. MS: no deformity or atrophy. Skin: warm and dry, no rash. Neuro:  Strength and sensation are intact. Psych: Normal affect.  Accessory Clinical Findings    Recent Labs: 04/06/2021: TSH 2.835 01/27/2022: B Natriuretic Peptide 32.0 02/10/2022: ALT 20 02/11/2022: BUN 19; Creatinine, Ser 0.70; Hemoglobin 11.5; Magnesium 1.8; Platelets 219; Potassium 3.5; Sodium 138   Recent Lipid Panel    Component Value Date/Time   CHOL 204 (H) 04/05/2021 1502   TRIG 158 (H) 04/05/2021 1502   HDL 51 04/05/2021 1502   CHOLHDL 4.0 04/05/2021 1502   VLDL 32 04/05/2021 1502   LDLCALC 121 (H) 04/05/2021 1502    No BP recorded.  {Refresh Note OR Click here to enter BP  :1}***    ECG personally reviewed by me today- *** - No acute changes  Echocardiogram 04/06/2021  1. Left ventricular ejection fraction, by estimation, is 40 to 45%. The  left ventricle has mildly decreased function. The left ventricle  demonstrates global hypokinesis. Very  difficult images even with Definity,  but I get the impression that LV systolic   function is reduced somewhat beyond what would be  expected with the  described coronary disease. Would consider followup imaging study via  cardiac MRI as this may allow better images and more accurate  quantification. There is mild left ventricular  hypertrophy. Left ventricular diastolic parameters are consistent with  Grade I diastolic dysfunction (impaired relaxation).   2. Right ventricular systolic function is normal. The right ventricular  size is not well visualized. Tricuspid regurgitation signal is inadequate  for assessing PA pressure.   3. The mitral valve is normal in structure. Trivial mitral valve  regurgitation. No evidence of mitral stenosis.   4. The aortic valve is tricuspid. Aortic valve regurgitation is not  visualized. Mild aortic valve sclerosis is present, with no evidence of  aortic valve stenosis.   5. Aortic dilatation noted. There is borderline dilatation of the  ascending aorta, measuring 37 mm.   6. The inferior vena cava is normal in size with <50% respiratory  variability, suggesting right atrial pressure of 8 mmHg.   7. Technically difficult study with poor acoustic windows.   FINDINGS   Left Ventricle: Left ventricular ejection fraction, by estimation, is 40  to 45%. The left ventricle has mildly decreased function. The left  ventricle demonstrates global hypokinesis. Definity contrast agent was  given IV to delineate the left ventricular   endocardial borders. The left ventricular internal cavity size was normal  in size. There is mild left ventricular hypertrophy. Left ventricular  diastolic parameters are consistent with Grade I diastolic dysfunction  (impaired relaxation).   Right Ventricle: The right ventricular size is not well visualized. No  increase in right ventricular wall thickness. Right ventricular systolic  function is normal. Tricuspid regurgitation  signal is inadequate for  assessing PA pressure.   Left Atrium: Left atrial size was normal in size.   Right Atrium: Right atrial size was normal in size.   Pericardium: Trivial pericardial effusion is present.   Mitral Valve: The mitral valve is normal in structure. Trivial mitral  valve regurgitation. No evidence of mitral valve stenosis. MV peak  gradient, 3.9 mmHg. The mean mitral valve gradient is 1.0 mmHg.   Tricuspid Valve: The tricuspid valve is normal in structure. Tricuspid  valve regurgitation is not demonstrated.   Aortic Valve: The aortic valve is tricuspid. Aortic valve regurgitation is  not visualized. Mild aortic valve sclerosis is present, with no evidence  of aortic valve stenosis.   Pulmonic Valve: The pulmonic valve was normal in structure. Pulmonic valve  regurgitation is not visualized.   Aorta: Aortic dilatation noted. There is borderline dilatation of the  ascending aorta, measuring 37 mm.   Venous: The inferior vena cava is normal in size with less than 50%  respiratory variability, suggesting right atrial pressure of 8 mmHg.   IAS/Shunts: No atrial level shunt detected by color flow Doppler.  _____________  Cath: 04/05/2021     1st Mrg lesion is 80% stenosed.   A drug-eluting stent was successfully placed using a STENT ONYX FRONTIER 4.0X15.   Post intervention, there is a 0% residual stenosis.   LV end diastolic pressure is severely elevated.   1.  High-grade lesion of proximal first obtuse marginal treated with 1 drug-eluting stent 2.  Elevated LVEDP of 35 mmHg; the patient received 40 mg of IV Lasix in the cardiac catheterization laboratory 3.  Normal LV function.   Recommendation: Dual antiplatelet therapy for 1 year and medical management for diastolic dysfunction; an SGLT2 inhibitor and spironolactone may be considered.  The patient will be aggressively diuresed.  Diagnostic Dominance: Right Intervention          Assessment & Plan    1.  Coronary artery disease-no chest pain today.  Underwent cardiac catheterization on 04/05/2021 and received PCI with DES x1 to her proximal first obtuse marginal.  No other coronary disease was noted. Continue carvedilol, aspirin, Brilinta, Heart healthy low-sodium diet-salty 6 given Increase physical activity as tolerated May stop Brilinta after 10/23-we will need to continue aspirin and carvedilol.  Acute diastolic CHF-denies activity intolerance.  No increased DOE.  Previously noted to have EF that seem to be out of proportion with coronary disease.  Cardiac MRI was recommended. Continue carvedilol, Farxiga, torsemide, spironolactone Heart healthy low-sodium diet-salty 6 given Increase physical activity as tolerated Wishes to defer cardiac MRI at this time.  Hyperlipidemia-LDL*** Continue atorvastatin, ezetimibe Heart healthy low-sodium high-fiber diet Increase physical activity as tolerated  Essential hypertension-BP today***.  Well-controlled at home. Continue carvedilol, lisinopril  Tobacco abuse-continues smoking cessation. Congratulated on smoking cessation.  Disposition: Follow-up with Dr. Harl Bowie in 9-12 months.  Jossie Ng. Susie Pousson NP-C     03/13/2022, 3:42 PM [address] Office [phone] Fax [phone]  Notice: This dictation was prepared with Dragon dictation along with smaller phrase technology. Any transcriptional errors that result from this process are unintentional and may not be corrected upon review.  I spent***minutes examining this patient, reviewing medications, and using patient centered shared decision making involving her cardiac care.  Prior to her visit I spent greater than 20 minutes reviewing her past medical history,  medications, and prior cardiac tests.

## 2022-03-14 ENCOUNTER — Ambulatory Visit: Payer: 59 | Attending: Cardiology | Admitting: General Practice

## 2022-03-29 ENCOUNTER — Other Ambulatory Visit: Payer: Self-pay | Admitting: Cardiology

## 2022-05-24 ENCOUNTER — Other Ambulatory Visit: Payer: Self-pay | Admitting: Cardiology

## 2022-05-27 ENCOUNTER — Other Ambulatory Visit: Payer: Self-pay | Admitting: Family Medicine

## 2022-05-27 DIAGNOSIS — M25551 Pain in right hip: Secondary | ICD-10-CM

## 2022-06-01 ENCOUNTER — Inpatient Hospital Stay: Admission: RE | Admit: 2022-06-01 | Payer: 59 | Source: Ambulatory Visit

## 2022-06-27 DIAGNOSIS — Z131 Encounter for screening for diabetes mellitus: Secondary | ICD-10-CM | POA: Diagnosis not present

## 2022-06-27 DIAGNOSIS — E88819 Insulin resistance, unspecified: Secondary | ICD-10-CM | POA: Diagnosis not present

## 2022-06-27 DIAGNOSIS — Z79899 Other long term (current) drug therapy: Secondary | ICD-10-CM | POA: Diagnosis not present

## 2022-06-27 DIAGNOSIS — E559 Vitamin D deficiency, unspecified: Secondary | ICD-10-CM | POA: Diagnosis not present

## 2022-06-27 DIAGNOSIS — R635 Abnormal weight gain: Secondary | ICD-10-CM | POA: Diagnosis not present

## 2022-06-27 DIAGNOSIS — R5383 Other fatigue: Secondary | ICD-10-CM | POA: Diagnosis not present

## 2022-06-27 DIAGNOSIS — E8881 Metabolic syndrome: Secondary | ICD-10-CM | POA: Diagnosis not present

## 2022-06-27 DIAGNOSIS — Z0000 Encounter for general adult medical examination without abnormal findings: Secondary | ICD-10-CM | POA: Diagnosis not present

## 2022-06-27 DIAGNOSIS — R5382 Chronic fatigue, unspecified: Secondary | ICD-10-CM | POA: Diagnosis not present

## 2022-06-27 DIAGNOSIS — E7800 Pure hypercholesterolemia, unspecified: Secondary | ICD-10-CM | POA: Diagnosis not present

## 2022-06-27 DIAGNOSIS — D539 Nutritional anemia, unspecified: Secondary | ICD-10-CM | POA: Diagnosis not present

## 2022-06-27 DIAGNOSIS — Z6841 Body Mass Index (BMI) 40.0 and over, adult: Secondary | ICD-10-CM | POA: Diagnosis not present

## 2022-06-27 DIAGNOSIS — F17210 Nicotine dependence, cigarettes, uncomplicated: Secondary | ICD-10-CM | POA: Diagnosis not present

## 2022-06-30 ENCOUNTER — Other Ambulatory Visit: Payer: Self-pay | Admitting: Cardiology

## 2022-07-04 DIAGNOSIS — M5440 Lumbago with sciatica, unspecified side: Secondary | ICD-10-CM | POA: Diagnosis not present

## 2022-07-04 DIAGNOSIS — Z79899 Other long term (current) drug therapy: Secondary | ICD-10-CM | POA: Diagnosis not present

## 2022-07-04 DIAGNOSIS — M25551 Pain in right hip: Secondary | ICD-10-CM | POA: Diagnosis not present

## 2022-07-04 DIAGNOSIS — R030 Elevated blood-pressure reading, without diagnosis of hypertension: Secondary | ICD-10-CM | POA: Diagnosis not present

## 2022-07-04 DIAGNOSIS — M25561 Pain in right knee: Secondary | ICD-10-CM | POA: Diagnosis not present

## 2022-07-04 DIAGNOSIS — M25562 Pain in left knee: Secondary | ICD-10-CM | POA: Diagnosis not present

## 2022-07-07 DIAGNOSIS — Z79899 Other long term (current) drug therapy: Secondary | ICD-10-CM | POA: Diagnosis not present

## 2022-07-28 DIAGNOSIS — R7303 Prediabetes: Secondary | ICD-10-CM | POA: Diagnosis not present

## 2022-07-28 DIAGNOSIS — E559 Vitamin D deficiency, unspecified: Secondary | ICD-10-CM | POA: Diagnosis not present

## 2022-07-28 DIAGNOSIS — Z87891 Personal history of nicotine dependence: Secondary | ICD-10-CM | POA: Diagnosis not present

## 2022-07-28 DIAGNOSIS — Z6841 Body Mass Index (BMI) 40.0 and over, adult: Secondary | ICD-10-CM | POA: Diagnosis not present

## 2022-07-28 DIAGNOSIS — F17210 Nicotine dependence, cigarettes, uncomplicated: Secondary | ICD-10-CM | POA: Diagnosis not present

## 2022-07-28 DIAGNOSIS — Z9181 History of falling: Secondary | ICD-10-CM | POA: Diagnosis not present

## 2022-07-28 DIAGNOSIS — R635 Abnormal weight gain: Secondary | ICD-10-CM | POA: Diagnosis not present

## 2022-07-28 DIAGNOSIS — R9389 Abnormal findings on diagnostic imaging of other specified body structures: Secondary | ICD-10-CM | POA: Diagnosis not present

## 2022-07-28 DIAGNOSIS — E7800 Pure hypercholesterolemia, unspecified: Secondary | ICD-10-CM | POA: Diagnosis not present

## 2022-08-02 DIAGNOSIS — Z79899 Other long term (current) drug therapy: Secondary | ICD-10-CM | POA: Diagnosis not present

## 2022-08-02 DIAGNOSIS — R030 Elevated blood-pressure reading, without diagnosis of hypertension: Secondary | ICD-10-CM | POA: Diagnosis not present

## 2022-08-02 DIAGNOSIS — M25551 Pain in right hip: Secondary | ICD-10-CM | POA: Diagnosis not present

## 2022-08-02 DIAGNOSIS — M25561 Pain in right knee: Secondary | ICD-10-CM | POA: Diagnosis not present

## 2022-08-02 DIAGNOSIS — M25562 Pain in left knee: Secondary | ICD-10-CM | POA: Diagnosis not present

## 2022-08-02 DIAGNOSIS — Z6841 Body Mass Index (BMI) 40.0 and over, adult: Secondary | ICD-10-CM | POA: Diagnosis not present

## 2022-08-02 DIAGNOSIS — M5440 Lumbago with sciatica, unspecified side: Secondary | ICD-10-CM | POA: Diagnosis not present

## 2022-08-04 DIAGNOSIS — E119 Type 2 diabetes mellitus without complications: Secondary | ICD-10-CM | POA: Diagnosis not present

## 2022-08-04 DIAGNOSIS — H35372 Puckering of macula, left eye: Secondary | ICD-10-CM | POA: Diagnosis not present

## 2022-08-04 DIAGNOSIS — H2513 Age-related nuclear cataract, bilateral: Secondary | ICD-10-CM | POA: Diagnosis not present

## 2022-08-04 DIAGNOSIS — H35033 Hypertensive retinopathy, bilateral: Secondary | ICD-10-CM | POA: Diagnosis not present

## 2022-08-04 DIAGNOSIS — H3509 Other intraretinal microvascular abnormalities: Secondary | ICD-10-CM | POA: Diagnosis not present

## 2022-08-04 DIAGNOSIS — D3132 Benign neoplasm of left choroid: Secondary | ICD-10-CM | POA: Diagnosis not present

## 2022-08-11 DIAGNOSIS — Z87891 Personal history of nicotine dependence: Secondary | ICD-10-CM | POA: Diagnosis not present

## 2022-08-11 DIAGNOSIS — F17210 Nicotine dependence, cigarettes, uncomplicated: Secondary | ICD-10-CM | POA: Diagnosis not present

## 2022-08-23 ENCOUNTER — Other Ambulatory Visit: Payer: Self-pay | Admitting: Cardiology

## 2022-08-30 DIAGNOSIS — R030 Elevated blood-pressure reading, without diagnosis of hypertension: Secondary | ICD-10-CM | POA: Diagnosis not present

## 2022-08-30 DIAGNOSIS — Z6841 Body Mass Index (BMI) 40.0 and over, adult: Secondary | ICD-10-CM | POA: Diagnosis not present

## 2022-08-30 DIAGNOSIS — Z79899 Other long term (current) drug therapy: Secondary | ICD-10-CM | POA: Diagnosis not present

## 2022-08-30 DIAGNOSIS — M5440 Lumbago with sciatica, unspecified side: Secondary | ICD-10-CM | POA: Diagnosis not present

## 2022-09-06 ENCOUNTER — Other Ambulatory Visit: Payer: Medicare HMO

## 2022-09-29 DIAGNOSIS — M5440 Lumbago with sciatica, unspecified side: Secondary | ICD-10-CM | POA: Diagnosis not present

## 2022-09-29 DIAGNOSIS — I700 Atherosclerosis of aorta: Secondary | ICD-10-CM | POA: Diagnosis not present

## 2022-09-29 DIAGNOSIS — M25561 Pain in right knee: Secondary | ICD-10-CM | POA: Diagnosis not present

## 2022-09-29 DIAGNOSIS — Z6841 Body Mass Index (BMI) 40.0 and over, adult: Secondary | ICD-10-CM | POA: Diagnosis not present

## 2022-09-29 DIAGNOSIS — Z79899 Other long term (current) drug therapy: Secondary | ICD-10-CM | POA: Diagnosis not present

## 2022-09-29 DIAGNOSIS — R030 Elevated blood-pressure reading, without diagnosis of hypertension: Secondary | ICD-10-CM | POA: Diagnosis not present

## 2022-10-17 DIAGNOSIS — E559 Vitamin D deficiency, unspecified: Secondary | ICD-10-CM | POA: Diagnosis not present

## 2022-10-17 DIAGNOSIS — R635 Abnormal weight gain: Secondary | ICD-10-CM | POA: Diagnosis not present

## 2022-10-17 DIAGNOSIS — J449 Chronic obstructive pulmonary disease, unspecified: Secondary | ICD-10-CM | POA: Diagnosis not present

## 2022-10-17 DIAGNOSIS — Z6841 Body Mass Index (BMI) 40.0 and over, adult: Secondary | ICD-10-CM | POA: Diagnosis not present

## 2022-10-17 DIAGNOSIS — Z9181 History of falling: Secondary | ICD-10-CM | POA: Diagnosis not present

## 2022-10-17 DIAGNOSIS — E7800 Pure hypercholesterolemia, unspecified: Secondary | ICD-10-CM | POA: Diagnosis not present

## 2022-10-17 DIAGNOSIS — R7303 Prediabetes: Secondary | ICD-10-CM | POA: Diagnosis not present

## 2022-10-27 DIAGNOSIS — Z79899 Other long term (current) drug therapy: Secondary | ICD-10-CM | POA: Diagnosis not present

## 2022-10-27 DIAGNOSIS — I700 Atherosclerosis of aorta: Secondary | ICD-10-CM | POA: Diagnosis not present

## 2022-10-27 DIAGNOSIS — Z6841 Body Mass Index (BMI) 40.0 and over, adult: Secondary | ICD-10-CM | POA: Diagnosis not present

## 2022-10-27 DIAGNOSIS — M25561 Pain in right knee: Secondary | ICD-10-CM | POA: Diagnosis not present

## 2022-10-27 DIAGNOSIS — R030 Elevated blood-pressure reading, without diagnosis of hypertension: Secondary | ICD-10-CM | POA: Diagnosis not present

## 2022-10-27 DIAGNOSIS — M5440 Lumbago with sciatica, unspecified side: Secondary | ICD-10-CM | POA: Diagnosis not present

## 2022-10-31 DIAGNOSIS — Z955 Presence of coronary angioplasty implant and graft: Secondary | ICD-10-CM | POA: Diagnosis not present

## 2022-10-31 DIAGNOSIS — I252 Old myocardial infarction: Secondary | ICD-10-CM | POA: Diagnosis not present

## 2022-10-31 DIAGNOSIS — I10 Essential (primary) hypertension: Secondary | ICD-10-CM | POA: Diagnosis not present

## 2022-10-31 DIAGNOSIS — R079 Chest pain, unspecified: Secondary | ICD-10-CM | POA: Diagnosis not present

## 2022-10-31 DIAGNOSIS — R002 Palpitations: Secondary | ICD-10-CM | POA: Diagnosis not present

## 2022-10-31 DIAGNOSIS — I2510 Atherosclerotic heart disease of native coronary artery without angina pectoris: Secondary | ICD-10-CM | POA: Diagnosis not present

## 2022-10-31 DIAGNOSIS — I700 Atherosclerosis of aorta: Secondary | ICD-10-CM | POA: Diagnosis not present

## 2022-11-16 DIAGNOSIS — R7303 Prediabetes: Secondary | ICD-10-CM | POA: Diagnosis not present

## 2022-11-16 DIAGNOSIS — Z9181 History of falling: Secondary | ICD-10-CM | POA: Diagnosis not present

## 2022-11-16 DIAGNOSIS — E7800 Pure hypercholesterolemia, unspecified: Secondary | ICD-10-CM | POA: Diagnosis not present

## 2022-11-16 DIAGNOSIS — E559 Vitamin D deficiency, unspecified: Secondary | ICD-10-CM | POA: Diagnosis not present

## 2022-11-16 DIAGNOSIS — J449 Chronic obstructive pulmonary disease, unspecified: Secondary | ICD-10-CM | POA: Diagnosis not present

## 2022-11-16 DIAGNOSIS — I700 Atherosclerosis of aorta: Secondary | ICD-10-CM | POA: Diagnosis not present

## 2022-11-16 DIAGNOSIS — Z6841 Body Mass Index (BMI) 40.0 and over, adult: Secondary | ICD-10-CM | POA: Diagnosis not present

## 2022-11-16 DIAGNOSIS — R635 Abnormal weight gain: Secondary | ICD-10-CM | POA: Diagnosis not present

## 2022-11-21 DIAGNOSIS — R002 Palpitations: Secondary | ICD-10-CM | POA: Diagnosis not present

## 2022-11-22 DIAGNOSIS — E785 Hyperlipidemia, unspecified: Secondary | ICD-10-CM | POA: Diagnosis not present

## 2022-11-22 DIAGNOSIS — Z713 Dietary counseling and surveillance: Secondary | ICD-10-CM | POA: Diagnosis not present

## 2022-11-22 DIAGNOSIS — L03119 Cellulitis of unspecified part of limb: Secondary | ICD-10-CM | POA: Diagnosis not present

## 2022-11-22 DIAGNOSIS — Z79899 Other long term (current) drug therapy: Secondary | ICD-10-CM | POA: Diagnosis not present

## 2022-11-22 DIAGNOSIS — Z6841 Body Mass Index (BMI) 40.0 and over, adult: Secondary | ICD-10-CM | POA: Diagnosis not present

## 2022-11-22 DIAGNOSIS — M5440 Lumbago with sciatica, unspecified side: Secondary | ICD-10-CM | POA: Diagnosis not present

## 2022-11-22 DIAGNOSIS — E119 Type 2 diabetes mellitus without complications: Secondary | ICD-10-CM | POA: Diagnosis not present

## 2022-11-22 DIAGNOSIS — I10 Essential (primary) hypertension: Secondary | ICD-10-CM | POA: Diagnosis not present

## 2022-11-28 DIAGNOSIS — R030 Elevated blood-pressure reading, without diagnosis of hypertension: Secondary | ICD-10-CM | POA: Diagnosis not present

## 2022-11-28 DIAGNOSIS — F430 Acute stress reaction: Secondary | ICD-10-CM | POA: Diagnosis not present

## 2022-11-28 DIAGNOSIS — Z79899 Other long term (current) drug therapy: Secondary | ICD-10-CM | POA: Diagnosis not present

## 2022-11-28 DIAGNOSIS — I700 Atherosclerosis of aorta: Secondary | ICD-10-CM | POA: Diagnosis not present

## 2022-11-28 DIAGNOSIS — M25561 Pain in right knee: Secondary | ICD-10-CM | POA: Diagnosis not present

## 2022-11-28 DIAGNOSIS — M5440 Lumbago with sciatica, unspecified side: Secondary | ICD-10-CM | POA: Diagnosis not present

## 2022-11-28 DIAGNOSIS — Z6841 Body Mass Index (BMI) 40.0 and over, adult: Secondary | ICD-10-CM | POA: Diagnosis not present

## 2022-12-07 DIAGNOSIS — Z1211 Encounter for screening for malignant neoplasm of colon: Secondary | ICD-10-CM | POA: Diagnosis not present

## 2022-12-11 DIAGNOSIS — I4719 Other supraventricular tachycardia: Secondary | ICD-10-CM | POA: Diagnosis not present

## 2022-12-16 DIAGNOSIS — Z9181 History of falling: Secondary | ICD-10-CM | POA: Diagnosis not present

## 2022-12-16 DIAGNOSIS — Z6841 Body Mass Index (BMI) 40.0 and over, adult: Secondary | ICD-10-CM | POA: Diagnosis not present

## 2022-12-16 DIAGNOSIS — E559 Vitamin D deficiency, unspecified: Secondary | ICD-10-CM | POA: Diagnosis not present

## 2022-12-16 DIAGNOSIS — I700 Atherosclerosis of aorta: Secondary | ICD-10-CM | POA: Diagnosis not present

## 2022-12-16 DIAGNOSIS — R911 Solitary pulmonary nodule: Secondary | ICD-10-CM | POA: Diagnosis not present

## 2022-12-16 DIAGNOSIS — E7800 Pure hypercholesterolemia, unspecified: Secondary | ICD-10-CM | POA: Diagnosis not present

## 2022-12-16 DIAGNOSIS — F17210 Nicotine dependence, cigarettes, uncomplicated: Secondary | ICD-10-CM | POA: Diagnosis not present

## 2022-12-16 DIAGNOSIS — R7303 Prediabetes: Secondary | ICD-10-CM | POA: Diagnosis not present

## 2022-12-16 DIAGNOSIS — R635 Abnormal weight gain: Secondary | ICD-10-CM | POA: Diagnosis not present
